# Patient Record
Sex: Female | Born: 1970 | Race: White | Hispanic: No | Smoking: Current some day smoker
Health system: Southern US, Community
[De-identification: ages and names within clinical notes are randomized; demographics above are authoritative.]

## PROBLEM LIST (undated history)

## (undated) DIAGNOSIS — I4892 Unspecified atrial flutter: Secondary | ICD-10-CM

## (undated) DIAGNOSIS — A419 Sepsis, unspecified organism: Secondary | ICD-10-CM

## (undated) DIAGNOSIS — I330 Acute and subacute infective endocarditis: Secondary | ICD-10-CM

## (undated) DIAGNOSIS — I76 Septic arterial embolism: Secondary | ICD-10-CM

## (undated) DIAGNOSIS — I669 Occlusion and stenosis of unspecified cerebral artery: Secondary | ICD-10-CM

## (undated) DIAGNOSIS — G8929 Other chronic pain: Secondary | ICD-10-CM

## (undated) DIAGNOSIS — Z9119 Patient's noncompliance with other medical treatment and regimen: Secondary | ICD-10-CM

## (undated) DIAGNOSIS — Z953 Presence of xenogenic heart valve: Secondary | ICD-10-CM

## (undated) DIAGNOSIS — F419 Anxiety disorder, unspecified: Secondary | ICD-10-CM

## (undated) DIAGNOSIS — E039 Hypothyroidism, unspecified: Secondary | ICD-10-CM

## (undated) DIAGNOSIS — Z950 Presence of cardiac pacemaker: Secondary | ICD-10-CM

## (undated) DIAGNOSIS — I442 Atrioventricular block, complete: Secondary | ICD-10-CM

## (undated) DIAGNOSIS — D649 Anemia, unspecified: Secondary | ICD-10-CM

## (undated) DIAGNOSIS — I639 Cerebral infarction, unspecified: Secondary | ICD-10-CM

## (undated) DIAGNOSIS — I38 Endocarditis, valve unspecified: Secondary | ICD-10-CM

## (undated) DIAGNOSIS — F1910 Other psychoactive substance abuse, uncomplicated: Secondary | ICD-10-CM

## (undated) DIAGNOSIS — I509 Heart failure, unspecified: Secondary | ICD-10-CM

## (undated) DIAGNOSIS — Z91199 Patient's noncompliance with other medical treatment and regimen due to unspecified reason: Secondary | ICD-10-CM

## (undated) DIAGNOSIS — R0602 Shortness of breath: Secondary | ICD-10-CM

## (undated) DIAGNOSIS — M1990 Unspecified osteoarthritis, unspecified site: Secondary | ICD-10-CM

## (undated) DIAGNOSIS — F1110 Opioid abuse, uncomplicated: Secondary | ICD-10-CM

## (undated) DIAGNOSIS — B1920 Unspecified viral hepatitis C without hepatic coma: Secondary | ICD-10-CM

## (undated) DIAGNOSIS — I2510 Atherosclerotic heart disease of native coronary artery without angina pectoris: Secondary | ICD-10-CM

## (undated) DIAGNOSIS — T826XXA Infection and inflammatory reaction due to cardiac valve prosthesis, initial encounter: Secondary | ICD-10-CM

## (undated) DIAGNOSIS — F99 Mental disorder, not otherwise specified: Secondary | ICD-10-CM

## (undated) HISTORY — DX: Unspecified viral hepatitis C without hepatic coma: B19.20

## (undated) HISTORY — DX: Heart failure, unspecified: I50.9

## (undated) HISTORY — DX: Acute and subacute infective endocarditis: I33.0

## (undated) HISTORY — PX: CHOLECYSTECTOMY: SHX55

## (undated) HISTORY — PX: TONSILLECTOMY AND ADENOIDECTOMY: SUR1326

## (undated) HISTORY — DX: Anemia, unspecified: D64.9

## (undated) HISTORY — DX: Hypothyroidism, unspecified: E03.9

## (undated) HISTORY — DX: Unspecified osteoarthritis, unspecified site: M19.90

## (undated) HISTORY — PX: CATARACT EXTRACTION: SUR2

---

## 1997-08-20 ENCOUNTER — Emergency Department (HOSPITAL_COMMUNITY): Admission: EM | Admit: 1997-08-20 | Discharge: 1997-08-20 | Payer: Self-pay | Admitting: Emergency Medicine

## 1998-02-08 ENCOUNTER — Emergency Department (HOSPITAL_COMMUNITY): Admission: EM | Admit: 1998-02-08 | Discharge: 1998-02-08 | Payer: Self-pay | Admitting: Emergency Medicine

## 2000-03-25 ENCOUNTER — Encounter: Payer: Self-pay | Admitting: Emergency Medicine

## 2000-03-25 ENCOUNTER — Inpatient Hospital Stay (HOSPITAL_COMMUNITY): Admission: EM | Admit: 2000-03-25 | Discharge: 2000-03-30 | Payer: Self-pay | Admitting: Emergency Medicine

## 2000-07-03 ENCOUNTER — Emergency Department (HOSPITAL_COMMUNITY): Admission: EM | Admit: 2000-07-03 | Discharge: 2000-07-03 | Payer: Self-pay | Admitting: Emergency Medicine

## 2000-10-07 ENCOUNTER — Emergency Department (HOSPITAL_COMMUNITY): Admission: EM | Admit: 2000-10-07 | Discharge: 2000-10-07 | Payer: Self-pay

## 2001-01-31 ENCOUNTER — Emergency Department (HOSPITAL_COMMUNITY): Admission: EM | Admit: 2001-01-31 | Discharge: 2001-01-31 | Payer: Self-pay | Admitting: Emergency Medicine

## 2001-01-31 ENCOUNTER — Encounter: Payer: Self-pay | Admitting: Emergency Medicine

## 2002-08-10 ENCOUNTER — Emergency Department (HOSPITAL_COMMUNITY): Admission: EM | Admit: 2002-08-10 | Discharge: 2002-08-10 | Payer: Self-pay | Admitting: Emergency Medicine

## 2002-09-15 ENCOUNTER — Emergency Department (HOSPITAL_COMMUNITY): Admission: EM | Admit: 2002-09-15 | Discharge: 2002-09-15 | Payer: Self-pay | Admitting: Emergency Medicine

## 2002-09-23 ENCOUNTER — Ambulatory Visit (HOSPITAL_COMMUNITY): Admission: RE | Admit: 2002-09-23 | Discharge: 2002-09-23 | Payer: Self-pay | Admitting: *Deleted

## 2002-09-23 ENCOUNTER — Encounter: Payer: Self-pay | Admitting: *Deleted

## 2002-09-27 ENCOUNTER — Emergency Department (HOSPITAL_COMMUNITY): Admission: EM | Admit: 2002-09-27 | Discharge: 2002-09-27 | Payer: Self-pay

## 2002-10-02 ENCOUNTER — Emergency Department (HOSPITAL_COMMUNITY): Admission: EM | Admit: 2002-10-02 | Discharge: 2002-10-02 | Payer: Self-pay | Admitting: Emergency Medicine

## 2002-10-13 ENCOUNTER — Emergency Department (HOSPITAL_COMMUNITY): Admission: EM | Admit: 2002-10-13 | Discharge: 2002-10-13 | Payer: Self-pay | Admitting: Emergency Medicine

## 2002-10-29 ENCOUNTER — Emergency Department (HOSPITAL_COMMUNITY): Admission: EM | Admit: 2002-10-29 | Discharge: 2002-10-30 | Payer: Self-pay | Admitting: Emergency Medicine

## 2004-07-18 ENCOUNTER — Emergency Department (HOSPITAL_COMMUNITY): Admission: EM | Admit: 2004-07-18 | Discharge: 2004-07-18 | Payer: Self-pay | Admitting: Emergency Medicine

## 2004-08-07 ENCOUNTER — Emergency Department (HOSPITAL_COMMUNITY): Admission: EM | Admit: 2004-08-07 | Discharge: 2004-08-07 | Payer: Self-pay | Admitting: Emergency Medicine

## 2004-08-09 ENCOUNTER — Emergency Department (HOSPITAL_COMMUNITY): Admission: EM | Admit: 2004-08-09 | Discharge: 2004-08-09 | Payer: Self-pay | Admitting: Emergency Medicine

## 2004-09-03 ENCOUNTER — Emergency Department (HOSPITAL_COMMUNITY): Admission: EM | Admit: 2004-09-03 | Discharge: 2004-09-03 | Payer: Self-pay | Admitting: Emergency Medicine

## 2005-01-22 ENCOUNTER — Emergency Department (HOSPITAL_COMMUNITY): Admission: EM | Admit: 2005-01-22 | Discharge: 2005-01-22 | Payer: Self-pay | Admitting: Emergency Medicine

## 2006-07-23 ENCOUNTER — Emergency Department (HOSPITAL_COMMUNITY): Admission: EM | Admit: 2006-07-23 | Discharge: 2006-07-23 | Payer: Self-pay | Admitting: Emergency Medicine

## 2006-11-29 ENCOUNTER — Other Ambulatory Visit: Payer: Self-pay

## 2006-11-29 ENCOUNTER — Inpatient Hospital Stay: Payer: Self-pay | Admitting: Vascular Surgery

## 2006-11-30 ENCOUNTER — Other Ambulatory Visit: Payer: Self-pay

## 2007-01-30 ENCOUNTER — Emergency Department (HOSPITAL_COMMUNITY): Admission: EM | Admit: 2007-01-30 | Discharge: 2007-01-30 | Payer: Self-pay | Admitting: Emergency Medicine

## 2007-02-01 ENCOUNTER — Emergency Department (HOSPITAL_COMMUNITY): Admission: EM | Admit: 2007-02-01 | Discharge: 2007-02-01 | Payer: Self-pay | Admitting: Family Medicine

## 2007-04-11 ENCOUNTER — Emergency Department (HOSPITAL_COMMUNITY): Admission: EM | Admit: 2007-04-11 | Discharge: 2007-04-11 | Payer: Self-pay | Admitting: Emergency Medicine

## 2007-06-28 ENCOUNTER — Emergency Department (HOSPITAL_COMMUNITY): Admission: EM | Admit: 2007-06-28 | Discharge: 2007-06-28 | Payer: Self-pay | Admitting: Emergency Medicine

## 2008-01-24 ENCOUNTER — Emergency Department (HOSPITAL_COMMUNITY): Admission: EM | Admit: 2008-01-24 | Discharge: 2008-01-24 | Payer: Self-pay | Admitting: Emergency Medicine

## 2008-07-18 ENCOUNTER — Emergency Department (HOSPITAL_COMMUNITY): Admission: EM | Admit: 2008-07-18 | Discharge: 2008-07-18 | Payer: Self-pay | Admitting: Emergency Medicine

## 2008-09-29 ENCOUNTER — Emergency Department (HOSPITAL_COMMUNITY): Admission: EM | Admit: 2008-09-29 | Discharge: 2008-09-29 | Payer: Self-pay | Admitting: Emergency Medicine

## 2008-11-23 ENCOUNTER — Emergency Department: Payer: Self-pay | Admitting: Emergency Medicine

## 2008-12-06 ENCOUNTER — Emergency Department: Payer: Self-pay | Admitting: Internal Medicine

## 2009-02-16 ENCOUNTER — Emergency Department: Payer: Self-pay | Admitting: Emergency Medicine

## 2009-05-29 ENCOUNTER — Emergency Department: Payer: Self-pay | Admitting: Unknown Physician Specialty

## 2009-06-13 ENCOUNTER — Ambulatory Visit: Payer: Self-pay | Admitting: Internal Medicine

## 2009-11-25 ENCOUNTER — Emergency Department (HOSPITAL_COMMUNITY): Admission: EM | Admit: 2009-11-25 | Discharge: 2009-11-26 | Payer: Self-pay | Admitting: Emergency Medicine

## 2009-12-29 ENCOUNTER — Emergency Department (HOSPITAL_COMMUNITY)
Admission: EM | Admit: 2009-12-29 | Discharge: 2009-12-29 | Payer: Self-pay | Source: Home / Self Care | Admitting: Emergency Medicine

## 2010-03-26 LAB — POCT I-STAT, CHEM 8
BUN: 8 mg/dL (ref 6–23)
Calcium, Ion: 1.08 mmol/L — ABNORMAL LOW (ref 1.12–1.32)
Chloride: 100 mEq/L (ref 96–112)
Creatinine, Ser: 0.8 mg/dL (ref 0.4–1.2)
Glucose, Bld: 128 mg/dL — ABNORMAL HIGH (ref 70–99)
HCT: 37 % (ref 36.0–46.0)
Potassium: 3 mEq/L — ABNORMAL LOW (ref 3.5–5.1)

## 2010-03-26 LAB — URINALYSIS, ROUTINE W REFLEX MICROSCOPIC
Bilirubin Urine: NEGATIVE
Glucose, UA: NEGATIVE mg/dL
Ketones, ur: NEGATIVE mg/dL
Protein, ur: 30 mg/dL — AB
pH: 6 (ref 5.0–8.0)

## 2010-03-26 LAB — DIFFERENTIAL
Lymphocytes Relative: 18 % (ref 12–46)
Lymphs Abs: 1 10*3/uL (ref 0.7–4.0)
Monocytes Absolute: 0.6 10*3/uL (ref 0.1–1.0)
Monocytes Relative: 11 % (ref 3–12)
Neutro Abs: 3.7 10*3/uL (ref 1.7–7.7)
Neutrophils Relative %: 69 % (ref 43–77)

## 2010-03-26 LAB — URINE MICROSCOPIC-ADD ON

## 2010-03-26 LAB — CBC
HCT: 35.3 % — ABNORMAL LOW (ref 36.0–46.0)
Hemoglobin: 12.2 g/dL (ref 12.0–15.0)
MCHC: 34.6 g/dL (ref 30.0–36.0)
RBC: 3.95 MIL/uL (ref 3.87–5.11)

## 2010-04-01 ENCOUNTER — Emergency Department (HOSPITAL_COMMUNITY)
Admission: EM | Admit: 2010-04-01 | Discharge: 2010-04-01 | Disposition: A | Discharge status: Home / Self Care | Payer: Self-pay | Attending: Emergency Medicine | Admitting: Emergency Medicine

## 2010-04-01 DIAGNOSIS — Z8619 Personal history of other infectious and parasitic diseases: Secondary | ICD-10-CM | POA: Insufficient documentation

## 2010-04-01 DIAGNOSIS — M25519 Pain in unspecified shoulder: Secondary | ICD-10-CM | POA: Insufficient documentation

## 2010-05-14 ENCOUNTER — Ambulatory Visit: Payer: Self-pay | Admitting: Internal Medicine

## 2010-05-14 DIAGNOSIS — I639 Cerebral infarction, unspecified: Secondary | ICD-10-CM

## 2010-05-14 DIAGNOSIS — I669 Occlusion and stenosis of unspecified cerebral artery: Secondary | ICD-10-CM

## 2010-05-14 HISTORY — DX: Occlusion and stenosis of unspecified cerebral artery: I66.9

## 2010-05-14 HISTORY — DX: Cerebral infarction, unspecified: I63.9

## 2010-05-23 ENCOUNTER — Emergency Department (HOSPITAL_COMMUNITY)
Admission: EM | Admit: 2010-05-23 | Discharge: 2010-05-23 | Disposition: A | Discharge status: Home / Self Care | Payer: Medicaid Other | Attending: Emergency Medicine | Admitting: Emergency Medicine

## 2010-05-23 DIAGNOSIS — M79609 Pain in unspecified limb: Secondary | ICD-10-CM

## 2010-05-23 DIAGNOSIS — M7989 Other specified soft tissue disorders: Secondary | ICD-10-CM | POA: Insufficient documentation

## 2010-05-23 DIAGNOSIS — X58XXXA Exposure to other specified factors, initial encounter: Secondary | ICD-10-CM | POA: Insufficient documentation

## 2010-05-23 DIAGNOSIS — T148XXA Other injury of unspecified body region, initial encounter: Secondary | ICD-10-CM | POA: Insufficient documentation

## 2010-05-23 DIAGNOSIS — B1920 Unspecified viral hepatitis C without hepatic coma: Secondary | ICD-10-CM | POA: Insufficient documentation

## 2010-05-23 DIAGNOSIS — M25539 Pain in unspecified wrist: Secondary | ICD-10-CM | POA: Insufficient documentation

## 2010-05-31 NOTE — Discharge Summary (Signed)
[place]. [facility]  Patient:    [name], [name]                         MRN: [id number] Adm. Date:  [id number] Disc. Date: [id number] Attending:  Dominica Severin Dictator:   Alexzandrew L. Perkins, P.A.-C.                           Discharge Summary  ADMITTING DIAGNOSES: 1. Right hand cellulitis and infection. 2. Past history of bronchitis, approximately three years ago. 3. Tobacco history.  DISCHARGE DIAGNOSES: 1. Right wrist abscess, dorsal ulnar aspect.  Status post incision and    drainage dorsal ulnar aspect right wrist and hand. 2. Past history of bronchitis, approximately three years ago. 3. Tobacco history.  PROCEDURE:  Patient was taken to the OR on March 25, 2000, underwent an I&D of the dorsal ulnar aspect of the right wrist and the hand with a noted abscess. Surgeon Dr. Amanda Pea.  Surgery done was under general anesthesia.  One large iodoform gauze packing drain was placed.  Cultures - aerobic/anaerobic - were taken at the time of procedure.  CONSULTS:  None.  BRIEF HISTORY:  The patient is a 40 year old female who shut her right hand in a car door approximately three days prior to admission, on Sunday.  She had immediate onset of pain felt on the injury.  However, over the past several days she had a small area on the dorsal aspect of her hand which started to swell and became painful, eventually formed a small boil which opened up, started draining a purulent pus-like material.  She was seen in the ER, felt to have cellulitis with an abscess on the right hand, Dr. Amanda Pea on call.  Pt was seen and evaluated and admitted for IV antibiotics and also surgery.  LABORATORY DATA:  CBC on admission showed a hematocrit 41.5, white cell count 8.4, hemoglobin 14.2.  Differential all within normal limits.  Cultures x 2 - tissue culture taken on March 25, 2000 showed no organisms with moderate wbcs on the gram stain.  Culture showed few microaerophilic  streptococci. Anaerobic culture taken on March 25, 2000 showed no organisms and moderate wbcs on the grain stain with no anaerobes isolated.  HOSPITAL COURSE:  Patient was admitted to [facility] on March 25, 2000, started on IV antibiotics.  She was started on IV Unasyn 3 g in the emergency department, planned for admission.  She was taken to the OR later that day and underwent the above-stated procedure.  She was transferred to the orthopedic floor, continued postoperative care.  Hand was elevated.  IV antibiotics were continued.  The patient was started on IV morphine supplementation with p.o. Percocet for pain, encouraged finger movement for edema control.  Dressing change was on the following day per Dr. Amanda Pea with the iodoform wick being removed.  She had noted erythema and edema around the incision.  She continued with IV antibiotics postoperatively.  Pain continued to improve postoperatively.  She was eventually weaned over to p.o. analgesics.  The wound was observed throughout the hospital course to a point where on March 30, 2000 she was doing quite well, moving fingers very well. She had been keeping the right hand and wrist elevated in the mission sling. IV antibiotic course had been completed.  It was decided she would be switched over to p.o. antibiotics and discharged home.  DISCHARGE  PLAN:  Patient is discharged home on March 30, 2000.  DISCHARGE DIAGNOSES:  Please see above.  DISCHARGE MEDICATIONS:] 1. Percocet #30 p.r.n. pain. 2. Amoxicillin 875 mg #20 b.i.d. for 10 more days.  DIET:  As tolerated.  ACTIVITY:  Keep the dressing clean and dry.  Daily dressing changes at home.  FOLLOW-UP:  In five to seven days with Dr. Amanda Pea.  DISPOSITION:  Home.  CONDITION UPON DISCHARGE:  Improved. DD:  04/13/00 TD:  04/13/00 Job: [id number]

## 2010-05-31 NOTE — Op Note (Signed)
[place]. [facility]  Patient:    [name], [name]                         MRN: [id number] Proc. Date: 03/25/00 Adm. Date:  [id number] Attending:  Dominica Severin                           Operative Report  PREOPERATIVE DIAGNOSIS:  Right wrist abscess, dorsal ulnar aspect.  POSTOPERATIVE DIAGNOSIS:  Right wrist abscess, dorsal ulnar aspect.  PROCEDURE:  Incision and drainage dorsal ulnar aspect of the right wrist and hand with noted abscess.  SURGEON:  Aron Baba, M.D.  ASSISTANT:  None.  ANESTHESIA:  General.  COMPLICATIONS:  None.  DRAINS:  One large Iodoform gauze packing drain.  CULTURES:  Aerobic and anaerobic taken.  SPECIMENS:  None.  TOURNIQUET TIME:  Zero.  INDICATIONS FOR THE PROCEDURE:  This patient is a pleasant white female, who presented with the above-mentioned diagnosis.  I have counselled her in regards to risks and benefits of surgery at length and she desires to proceed with I&D as described below.  The patient has negative x-rays.  Her white count is normal.  She has positive signs of an abscess about the dorsal/ulnar aspect of her wrist and hand with ______ cellulitis.  I feel that she should undergo I&D and exploration of the area in question.  This injury was originally sustained days ago when she hit it against the car door.  OPERATIVE FINDINGS:  The patient had a positive abscess about the dorsal ulnar aspect of the wrist.  This was I&D and cultured.  DESCRIPTION OF PROCEDURE:  The patient was seen by myself and anesthesia and taken to the operative suite, underwent smooth induction of anesthesia and was then prepped and draped in usual sterile fashion about the right upper extremity after appropriate padding in the supine position was accomplished. Once this was done and the patient had a sterile field secured and an I&D was accomplished.  This was done by making a small opening overlying the area that appeared  to be pointing towards the abscess.  This was done with a knife and blunt tip type surgical instrument.  The patient had immediate egress of purulent fluid.  This was cultured for aerobic and anaerobic culture. Following this, a large amount of fat necrosis and purulent tissue was expressed.  The patient underwent systematic examination and I&D of the skin and subcutaneous tissue and peritendinous tissue.  There was no obvious foreign body.  The patient had a large area that tracked underneath the skin where the purulence was located.  This was all I&Dd and necrotic tissue removed and copious amounts of saline placed in the wound.  Following this, the patient had the wound packed with Iodoform gauze without difficulty.  She was then placed in a sterile dressing without difficulty.  She had normal refill at the conclusion of the case.  Tourniquet was not inflated.  She tolerated the procedure well without difficulty.  PLAN:  She will be admitted for IV antibiotics, elevation and close observation.  I have discussed findings with family, etc.  All questions have been encouraged and answered. DD:  03/25/00 TD:  03/25/00 Job: [id number]

## 2010-05-31 NOTE — H&P (Signed)
[place]. [facility]  Patient:    [name], [name]                         MRN: [id number] Adm. Date:  [id number] Attending:  Cathren Laine                         History and Physical  CHIEF COMPLAINT:  Right hand pain.  HISTORY OF PRESENT ILLNESS:  The patient is a 40 year old female who shut her right hand in a car door approximately three days ago, on Sunday. She had immediate onset of pain following the injury, however, over the past several days the small area on the dorsal aspect of her hand has started to swell and become painful and has swelled up into a small boil which opened up and started draining a pus-like material. She stated that most of today it has been draining kind of a yellowish fluid. Her hand is quite tender and warm to touch. She came over to [facility] Emergency Department for evaluation. The patient was seen and evaluated by the emergency staff and found to have cellulitis and infection into the right hand. Dr. Onalee Hua was on call for [facility] and hand call. The patient was seen and evaluated and due to her cellulitis and infection, it was felt that she would best be served by being admitted to the hospital for IV antibiotics and also for irrigation and debridement of the right hand. The risks and benefits of the procedure were discussed as per Dr. Amanda Pea and the patient subsequently admitted.  ALLERGIES:  CODEINE causes a rash.  CURRENT MEDICATIONS:  None.  PAST MEDICAL HISTORY:  Past history of bronchitis approximately three years ago.  PAST SURGICAL HISTORY: 1. Appendectomy. 2. Tonsillectomy and adenoidectomy. 3. Tubal ligation.  SOCIAL HISTORY:  The patient is separated. She is unemployed at this time. She has three children. She is a 1-pack per day smoker and denies use of alcohol.  FAMILY HISTORY:  Mother deceased at age 22 secondary to a shooting and father deceased in his 69s from a  motor vehicle accident.  REVIEW OF SYSTEMS:  GENERAL:  No chills or night sweats, however, she has had some intermittent fevers over the past 48 hours. NEUROLOGICAL:  No syncope, seizures or paralysis. RESPIRATORY:  No shortness of breath, productive cough or hemoptysis.  CARDIOVASCULAR:  No chest pain, angina or orthopnea.  GI:  No diarrhea or constipation. No vomiting. She has felt nauseated intermittently today. No blood or mucous in the stool. GU:  No dysuria, hematuria or discharge.  MUSCULOSKELETAL:  Pertinent to that of the right hand found in the history of present illness.  PHYSICAL EXAMINATION:  VITAL SIGNS:  Temperature 97.7, pulse 87, respirations 18 and blood pressure 91/73.  GENERAL:  The patient is a 40 year old white female, well-developed, well-nourished, slightly overweight in in no acute distress. She is alert, oriented and cooperative.  HEENT:  Normocephalic and atraumatic. Pupils equal, round and reactive to light and accommodation. Extraocular movements intact. Oropharynx is clear.  NECK:  Supple, no carotid bruit.  LUNGS:  Chest clear to auscultation and percussion, no rhonchi or rales.  CARDIOVASCULAR:  Regular rate and rhythm, no murmurs, S1 and S2 noted.  ABDOMEN:  Soft, round, nontender, bowel sounds are present.  BREASTS:  Not performed, not pertinent to present illness.  GENITORECTAL:  Not performed, not pertinent to present illness.  EXTREMITIES:  Significant to the right hand. The patient has a small 2-3 cm ulcerative-type draining wound on the dorsum of her right hand in the mid-region over top of the 4th and 5th metacarpal with surrounding cellulitis of the dorsum of the hand and extending proximally over the wrist. It is warm to touch and indurated. It is draining amount of pus-like and also a small amount of serous-bloody drainage. Plus 2 radial pulse intact. Sensation is intact throughout the right hand the digits with a little decreased  sensation over the 5th finger to light touch.  LABORATORY AND ACCESSORY DATA:  CBC shows a white blood cell count of 8.4, hemoglobin of 14.2, hematocrit 41.5. Chem panel:  Sodium 139, potassium 4.1, chloride 103, BUN 6.0, creatinine 0.5 with a glucose level of 79.  IMPRESSION: 1. Right hand cellulitis and infection. 2. Past history of bronchitis three years ago. 3. Tobacco history.  PLAN:  The patient is admitted to [facility] to undergo IV antibiotics and also surgery to include an I&D of the right hand. Surgery will be performed by Dr. Onalee Hua. DD:  03/25/00 TD:  03/25/00 Job: [id number]

## 2010-06-11 ENCOUNTER — Inpatient Hospital Stay: Payer: Self-pay | Admitting: Internal Medicine

## 2010-06-13 ENCOUNTER — Ambulatory Visit: Payer: Self-pay | Admitting: Internal Medicine

## 2010-06-14 ENCOUNTER — Ambulatory Visit: Payer: Self-pay | Admitting: Internal Medicine

## 2010-06-14 HISTORY — PX: VALVE REPLACEMENT: SUR13

## 2010-06-23 ENCOUNTER — Inpatient Hospital Stay: Payer: Self-pay | Admitting: Internal Medicine

## 2010-07-14 ENCOUNTER — Ambulatory Visit: Payer: Self-pay | Admitting: Internal Medicine

## 2010-07-22 ENCOUNTER — Inpatient Hospital Stay (HOSPITAL_COMMUNITY)
Admission: RE | Admit: 2010-07-22 | Discharge: 2010-08-02 | DRG: 945 | Disposition: A | Discharge status: Home Health | Payer: Medicaid Other | Source: Other Acute Inpatient Hospital | Attending: Physical Medicine & Rehabilitation | Admitting: Physical Medicine & Rehabilitation

## 2010-07-22 ENCOUNTER — Inpatient Hospital Stay (HOSPITAL_COMMUNITY): Payer: Medicaid Other

## 2010-07-22 DIAGNOSIS — R5381 Other malaise: Secondary | ICD-10-CM | POA: Diagnosis present

## 2010-07-22 DIAGNOSIS — D696 Thrombocytopenia, unspecified: Secondary | ICD-10-CM | POA: Diagnosis present

## 2010-07-22 DIAGNOSIS — G9341 Metabolic encephalopathy: Secondary | ICD-10-CM

## 2010-07-22 DIAGNOSIS — G589 Mononeuropathy, unspecified: Secondary | ICD-10-CM | POA: Diagnosis present

## 2010-07-22 DIAGNOSIS — G7281 Critical illness myopathy: Secondary | ICD-10-CM

## 2010-07-22 DIAGNOSIS — I38 Endocarditis, valve unspecified: Secondary | ICD-10-CM | POA: Diagnosis present

## 2010-07-22 DIAGNOSIS — I609 Nontraumatic subarachnoid hemorrhage, unspecified: Secondary | ICD-10-CM | POA: Diagnosis present

## 2010-07-22 DIAGNOSIS — Z7982 Long term (current) use of aspirin: Secondary | ICD-10-CM

## 2010-07-22 DIAGNOSIS — I10 Essential (primary) hypertension: Secondary | ICD-10-CM | POA: Diagnosis present

## 2010-07-22 DIAGNOSIS — K7460 Unspecified cirrhosis of liver: Secondary | ICD-10-CM | POA: Diagnosis present

## 2010-07-22 DIAGNOSIS — I509 Heart failure, unspecified: Secondary | ICD-10-CM | POA: Diagnosis present

## 2010-07-22 DIAGNOSIS — F1910 Other psychoactive substance abuse, uncomplicated: Secondary | ICD-10-CM | POA: Diagnosis present

## 2010-07-22 DIAGNOSIS — I6340 Cerebral infarction due to embolism of unspecified cerebral artery: Secondary | ICD-10-CM

## 2010-07-22 DIAGNOSIS — I079 Rheumatic tricuspid valve disease, unspecified: Secondary | ICD-10-CM | POA: Diagnosis present

## 2010-07-22 DIAGNOSIS — B1920 Unspecified viral hepatitis C without hepatic coma: Secondary | ICD-10-CM | POA: Diagnosis present

## 2010-07-22 DIAGNOSIS — R197 Diarrhea, unspecified: Secondary | ICD-10-CM | POA: Diagnosis present

## 2010-07-22 DIAGNOSIS — J9600 Acute respiratory failure, unspecified whether with hypoxia or hypercapnia: Secondary | ICD-10-CM | POA: Diagnosis present

## 2010-07-22 DIAGNOSIS — N179 Acute kidney failure, unspecified: Secondary | ICD-10-CM | POA: Diagnosis present

## 2010-07-22 DIAGNOSIS — I2699 Other pulmonary embolism without acute cor pulmonale: Secondary | ICD-10-CM | POA: Diagnosis present

## 2010-07-22 DIAGNOSIS — R1310 Dysphagia, unspecified: Secondary | ICD-10-CM | POA: Diagnosis present

## 2010-07-22 DIAGNOSIS — I4892 Unspecified atrial flutter: Secondary | ICD-10-CM | POA: Diagnosis present

## 2010-07-22 DIAGNOSIS — A4189 Other specified sepsis: Secondary | ICD-10-CM

## 2010-07-22 DIAGNOSIS — F17200 Nicotine dependence, unspecified, uncomplicated: Secondary | ICD-10-CM | POA: Diagnosis present

## 2010-07-22 DIAGNOSIS — IMO0002 Reserved for concepts with insufficient information to code with codable children: Secondary | ICD-10-CM | POA: Diagnosis present

## 2010-07-22 DIAGNOSIS — I059 Rheumatic mitral valve disease, unspecified: Secondary | ICD-10-CM | POA: Diagnosis present

## 2010-07-22 DIAGNOSIS — I214 Non-ST elevation (NSTEMI) myocardial infarction: Secondary | ICD-10-CM | POA: Diagnosis present

## 2010-07-22 DIAGNOSIS — Z5189 Encounter for other specified aftercare: Principal | ICD-10-CM

## 2010-07-22 DIAGNOSIS — A419 Sepsis, unspecified organism: Secondary | ICD-10-CM | POA: Diagnosis present

## 2010-07-22 LAB — URINALYSIS, ROUTINE W REFLEX MICROSCOPIC
Glucose, UA: NEGATIVE mg/dL
Ketones, ur: NEGATIVE mg/dL
Leukocytes, UA: NEGATIVE
Nitrite: NEGATIVE
Specific Gravity, Urine: 1.009 (ref 1.005–1.030)
pH: 6.5 (ref 5.0–8.0)

## 2010-07-22 LAB — GLUCOSE, CAPILLARY

## 2010-07-22 LAB — URINE MICROSCOPIC-ADD ON

## 2010-07-23 DIAGNOSIS — A4189 Other specified sepsis: Secondary | ICD-10-CM

## 2010-07-23 DIAGNOSIS — I6340 Cerebral infarction due to embolism of unspecified cerebral artery: Secondary | ICD-10-CM

## 2010-07-23 DIAGNOSIS — G7281 Critical illness myopathy: Secondary | ICD-10-CM

## 2010-07-23 DIAGNOSIS — G9341 Metabolic encephalopathy: Secondary | ICD-10-CM

## 2010-07-23 LAB — URINE CULTURE

## 2010-07-23 LAB — GLUCOSE, CAPILLARY
Glucose-Capillary: 80 mg/dL (ref 70–99)
Glucose-Capillary: 93 mg/dL (ref 70–99)

## 2010-07-23 LAB — COMPREHENSIVE METABOLIC PANEL
ALT: 15 U/L (ref 0–35)
AST: 17 U/L (ref 0–37)
CO2: 28 mEq/L (ref 19–32)
Chloride: 97 mEq/L (ref 96–112)
Creatinine, Ser: <0.47 mg/dL — ABNORMAL LOW (ref 0.50–1.10)
Glucose, Bld: 170 mg/dL — ABNORMAL HIGH (ref 70–99)
Sodium: 135 mEq/L (ref 135–145)
Total Bilirubin: 0.4 mg/dL (ref 0.3–1.2)

## 2010-07-24 LAB — GLUCOSE, CAPILLARY
Glucose-Capillary: 170 mg/dL — ABNORMAL HIGH (ref 70–99)
Glucose-Capillary: 104 mg/dL — ABNORMAL HIGH (ref 70–99)
Glucose-Capillary: 94 mg/dL (ref 70–99)

## 2010-07-25 LAB — GLUCOSE, CAPILLARY: Glucose-Capillary: 92 mg/dL (ref 70–99)

## 2010-07-26 DIAGNOSIS — I6340 Cerebral infarction due to embolism of unspecified cerebral artery: Secondary | ICD-10-CM

## 2010-07-26 DIAGNOSIS — G7281 Critical illness myopathy: Secondary | ICD-10-CM

## 2010-07-26 DIAGNOSIS — G9341 Metabolic encephalopathy: Secondary | ICD-10-CM

## 2010-07-26 DIAGNOSIS — A4189 Other specified sepsis: Secondary | ICD-10-CM

## 2010-07-26 LAB — GLUCOSE, CAPILLARY: Glucose-Capillary: 76 mg/dL (ref 70–99)

## 2010-07-29 DIAGNOSIS — G9341 Metabolic encephalopathy: Secondary | ICD-10-CM

## 2010-07-29 DIAGNOSIS — G960 Cerebrospinal fluid leak: Secondary | ICD-10-CM

## 2010-07-29 DIAGNOSIS — A4189 Other specified sepsis: Secondary | ICD-10-CM

## 2010-07-29 DIAGNOSIS — I6340 Cerebral infarction due to embolism of unspecified cerebral artery: Secondary | ICD-10-CM

## 2010-07-29 DIAGNOSIS — G9601 Cranial cerebrospinal fluid leak, spontaneous: Secondary | ICD-10-CM

## 2010-07-31 DIAGNOSIS — A4189 Other specified sepsis: Secondary | ICD-10-CM

## 2010-07-31 DIAGNOSIS — G7281 Critical illness myopathy: Secondary | ICD-10-CM

## 2010-07-31 DIAGNOSIS — I6340 Cerebral infarction due to embolism of unspecified cerebral artery: Secondary | ICD-10-CM

## 2010-07-31 DIAGNOSIS — G9341 Metabolic encephalopathy: Secondary | ICD-10-CM

## 2010-08-02 DIAGNOSIS — I6340 Cerebral infarction due to embolism of unspecified cerebral artery: Secondary | ICD-10-CM

## 2010-08-02 DIAGNOSIS — A4189 Other specified sepsis: Secondary | ICD-10-CM

## 2010-08-02 DIAGNOSIS — G7281 Critical illness myopathy: Secondary | ICD-10-CM

## 2010-08-02 DIAGNOSIS — G9341 Metabolic encephalopathy: Secondary | ICD-10-CM

## 2010-08-13 ENCOUNTER — Emergency Department (HOSPITAL_COMMUNITY)
Admission: EM | Admit: 2010-08-13 | Discharge: 2010-08-14 | Disposition: A | Discharge status: Home / Self Care | Payer: Medicaid Other | Attending: Emergency Medicine | Admitting: Emergency Medicine

## 2010-08-13 DIAGNOSIS — R339 Retention of urine, unspecified: Secondary | ICD-10-CM | POA: Insufficient documentation

## 2010-08-13 DIAGNOSIS — B1920 Unspecified viral hepatitis C without hepatic coma: Secondary | ICD-10-CM | POA: Insufficient documentation

## 2010-08-13 DIAGNOSIS — N342 Other urethritis: Secondary | ICD-10-CM | POA: Insufficient documentation

## 2010-08-13 DIAGNOSIS — R300 Dysuria: Secondary | ICD-10-CM | POA: Insufficient documentation

## 2010-08-13 LAB — DIFFERENTIAL
Eosinophils Absolute: 0.1 10*3/uL (ref 0.0–0.7)
Lymphocytes Relative: 42 % (ref 12–46)
Lymphs Abs: 2 10*3/uL (ref 0.7–4.0)
Neutro Abs: 2.3 10*3/uL (ref 1.7–7.7)
Neutrophils Relative %: 48 % (ref 43–77)

## 2010-08-13 LAB — AMMONIA: Ammonia: 11 umol/L (ref 11–60)

## 2010-08-13 LAB — CBC
HCT: 38.2 % (ref 36.0–46.0)
Hemoglobin: 12.3 g/dL (ref 12.0–15.0)
MCV: 98.7 fL (ref 78.0–100.0)
Platelets: 275 10*3/uL (ref 150–400)
RBC: 3.87 MIL/uL (ref 3.87–5.11)
WBC: 4.8 10*3/uL (ref 4.0–10.5)

## 2010-08-13 LAB — URINALYSIS, ROUTINE W REFLEX MICROSCOPIC
Glucose, UA: NEGATIVE mg/dL
Hgb urine dipstick: NEGATIVE
Leukocytes, UA: NEGATIVE
Specific Gravity, Urine: 1.014 (ref 1.005–1.030)
pH: 6.5 (ref 5.0–8.0)

## 2010-08-13 LAB — COMPREHENSIVE METABOLIC PANEL
AST: 51 U/L — ABNORMAL HIGH (ref 0–37)
CO2: 26 mEq/L (ref 19–32)
Chloride: 104 mEq/L (ref 96–112)
Creatinine, Ser: 0.54 mg/dL (ref 0.50–1.10)
GFR calc non Af Amer: >60 mL/min (ref >60)
Glucose, Bld: 100 mg/dL — ABNORMAL HIGH (ref 70–99)
Total Bilirubin: 0.3 mg/dL (ref 0.3–1.2)

## 2010-08-21 NOTE — H&P (Signed)
[name], [name]                  ACCOUNT NO.:  [id number]  MEDICAL RECORD NO.:  [id number]  LOCATION:  4028                         FACILITY:  [facility]  PHYSICIAN:  Ranelle Oyster, M.D.DATE OF BIRTH:  [birth year]  DATE OF ADMISSION:  07/22/2010 DATE OF DISCHARGE:                             HISTORY & PHYSICAL   CHIEF COMPLAINT:  Weakness after multiple medical problems.  HISTORY OF PRESENT ILLNESS:  This is a 40 year old white female with history of polysubstance abuse, hepatitis C, who was admitted to [facility] on Jun 11, 2010, with sepsis, thrombocytopenia, and hypertension.  UDS was positive for cocaine, benzos, and methadone.  Blood cultures positive for methicillin- sensitive staph aureus and CT was positive for moderate-sized vegetations in the mitral and tricuspid valves with moderate mitral valve regurgitation and tricuspid valve regurgitation.  Course was complicated by a non-ST-elevation MI.  She developed left frontal parietal subarachnoid hemorrhage secondary to septic emboli.  She is transferred to [facility] for treatment and require intubation for acute respiratory failure and then transferred back to [facility].  She developed bilateral pulmonary infarcts quite after reintubation and Cardiology felt she required CT surgery and the patient was transferred to [facility] on July 02, 2010.  She had issues there with acute renal failure requiring hemodialysis for a short- time period.  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  CTA of the head was done showing no hemorrhage or infarct.  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  Postoperatively, she had hypotension requiring pressors and she had atrial flutter as well through the amiodarone.  She continues on IV nafcillin for 6 weeks for treatment of endocarditis.  She continues  with decreased mentation.  PEG was placed on July 12, 2010.  Ophthalmology evaluation was done for cloudy left asymmetric pupil which was felt that be due to a prior episode of the right iritis.  Therapies been ongoing.  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  FEES was done on July 16, 2010, and the patient was placed on D2 soup diet with nectar liquids.  She tells [place] that she had another feed today which advanced her diet further, although we have no record of this.  Rehab Team has been following along with this patient and felt that she could benefit from an inpatient stay.  REVIEW OF SYSTEMS:  Notable for occasional incontinence, low back pain, weakness, diarrhea.  Full 12-point review is in the written H and P. Other pertinent positives above.  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  FAMILY HISTORY:  Positive for cancer, questionable CAD.  SOCIAL HISTORY:  Single, living with different friends prior to arrival. She plans to discharge to family to her daughter or sister's house potentially.  Daughter is in one-level house, few steps to enter.  The patient smoked a pack of cigarettes per day.  Does not drink and use cocaine and marijuana.  ALLERGIES:  NO KNOWN DRUG ALLERGIES.  MEDICATIONS AT HOME:  None.  PHYSICAL EXAMINATION:  VITAL SIGNS:  Blood pressure is 124/78, pulse is 84, respiratory rate is 16. GENERAL:  The patient is sitting in bed rather flat.  She is cooperative. HEENT:  Pupils are reactive to light.  She did have some dilation of the left pupil.  Ear, nose and throat exam notable for dry mucosa.  Multiple dental caries and broken teeth. NECK:  Supple without JVD or lymphadenopathy. CHEST:  Clear except for decreased sounds in the right space.  She  has positive murmur and click on heart auscultation. EXTREMITIES:  No clubbing, cyanosis or edema. ABDOMEN:  Soft, nontender.  The patient has some dried material around the PEG site, but otherwise, it was clean. Chest incision was clean and intact and healing quite nicely.  She had some petechial areas along the bilateral plantar foot surfaces. NEUROLOGIC:  Cranial nerves II-XII are grossly intact.  Reflexes 1+. Sensation was grossly intact to light touch and pinprick in all 4 extremities.  Judgment was fair for basic information.  She is able to follow simple commands and answer simple questions.  She is oriented to place with extra time and the reason she was here.  Memory was fair for short-term information.  Strength was 3+ to 4/5 upper extremities proximal distal.  Lower extremity strength was 2+/5 proximally at the hips with hip flexion.  She is 3/5 at the knees and 3+/5 ankle dorsiflexion, plantar flexion.  POST ADMISSION PHYSICIAN EVALUATION.: 1. 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     The patient required tricuspid and mitral valve replacement as     well. 2. The patient is admitted to receive collaborative interdisciplinary     care between the physiatrist, rehab nursing staff, and therapy     team. 3. The patient's level of medical complexity and substantial therapy     needs in context of that medical necessity cannot be provided a     lesser intensity of care. 4. The patient has experienced substantial functional loss from her     baseline.  Premorbidly, she was independent.  Currently, she is     supervision for grooming, sits at the edge of bed with contact     guard assist, min assist transfers, min assist ambulating 3000 feet      with rolling walker with mild ataxia.  Judging by the patient's     diagnosis, physical exam and functional history, she has a     potential for functional progress which will result in measurable     gains while inpatient rehab.  These gains will be of substantial     practical use upon discharge to home in facilitating mobility and     self-care. 5. The physiatrist provide 24-hour management of medical needs as well     as oversight of therapy plans/treatment to provide guidance as     appropriate regarding interaction of two. 6. A 24-hour rehab nursing team will assist in management of the     patient's skin care needs as well as nutrition, pain management,     integration of therapy concept techniques, education, etc. 7. PT will assess and treat for lower extremity strength, range of  motion, functional mobility, balance, adaptive equipment techniques     and family inpatient education with goals modified independent     supervision. 8. OT will assess and treat for upper extremity use ADLs, adaptive     techniques, equipment, functional mobility, safety, upper extremity     strength, cognitive perceptual training and goals modified     independent to supervision. 9. Speech Language pathology will follow for cognitive needs as well     as swallowing and communication with goals modified independent to     supervision. 10.Case management and social worker will assess and treat for     psychosocial issues and discharge planning. 11.Team conference will be held weekly to assess progress towards     goals and to determine barriers at discharge. 12.The patient has demonstrated sufficient medical stability and     exercise capacity to tolerate at least 3 hours therapy per day at     least 5 days per week. 13.Estimated length of stay is 1-2 weeks.  Prognosis is good.  MEDICAL PROBLEM LIST AND PLAN: 1. DVT prophylaxis and ambulation with SCDs.  The patient should be     able to move  amply for any significant sequelae here. 2. Pain management with Tylenol as well as lidocaine patches for low     back pain.  Also, will have therapy work on modalities and     techniques as well as good posture etc. for low back symptoms. 3. Staph bacteremia:  The patient completed antibiotics today.  We     will follow clinically.  Check CBCs, vital signs, etc. 4. Dysphagia:  Follow-up with primary team at [facility] regarding potential     FEES study that was done today.  Advance diet further depending     upon results.  The patient will continue with D2 nectar liquids for     the time being. 5. CHF:  She is on Lasix.  We will check weights daily as well as I's     and O's.  She seems somewhat compensated at this point. 6. Diarrhea:  Check C. diff as well as hold softeners laxative     laxatives at this point. 7. A flutter:  The patient is in sinus rhythm today.  We will continue     amiodarone.  Follow heart rate.  Another vital signs to assess for     tolerance and increased activity with therapy. 8. Acute renal failure:  This appears to have resolved.  We will check     BUN and creatinine upon admission to rehab. 9. Respiratory:  albuterol and Atrovent inhalers q.i.d.[name]  Provide     oxygen as needed.  Check sats at rest and with activity.  Adjust     going forward.     Ranelle Oyster, M.D.     ZTS/MEDQ  D:  07/22/2010  T:  07/23/2010  Job:  [id number]  Electronically Signed by Faith Rogue M.D. on 08/21/2010 09:51:50 AM

## 2010-08-21 NOTE — Discharge Summary (Signed)
[name], [name]                  ACCOUNT NO.:  [id number]  MEDICAL RECORD NO.:  [id number]  LOCATION:  4028                         FACILITY:  [facility]  PHYSICIAN:  Ranelle Oyster, M.D.DATE OF BIRTH:  10-19-1970  DATE OF ADMISSION:  07/22/2010 DATE OF DISCHARGE:  08/02/2010                              DISCHARGE SUMMARY   DISCHARGE DIAGNOSES: 1. Critical illness, myopathy. 2. 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 3. Congestive heart failure, compensated. 4. Chronic pain due to degenerative disk disease. 5. Hypotension.  HISTORY OF PRESENT ILLNESS:  [name] is a 40 year old female with history of polysubstance abuse, hepatitis C, admitted to [facility] on Jun 11, 2010, with sepsis, thrombocytopenia, and hypotension.  UDS was positive for cocaine, benzos, and methadone. Blood cultures were positive for MSSA and cardiac echo showed moderate- sized medications on mitral and tricuspid valve with moderate MVR and TVR.  Hospital course was complicated by an NSTEMI, distorted left pupil and left frontoparietal subarachnoid hemorrhage due to septic emboli. She was transferred to [facility] for treatment and required intubation for acute respiratory failure.  She was transferred back to [facility] where she developed pulmonary infarcts.  She did require reintubation and Card felt she would require a CT surgery; therefore, the patient transferred to [facility] on July 02, 2010.  The patient also has had issues with acute renal failure requiring hemodialysis for short time.  At time of admission to [facility], she was noted to have incomplete tetraplegia.  CT of head done showed no hemorrhage or infarct.  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  Postop with hypotension requiring pressors and Aflutter requiring amiodarone.  She has been treated with IV  antibiotics x6 weeks for endocarditis.  She continued to have issues with decreased mentation and PEG was placed on July 12, 2010, by Interventional Radiology.  Ophthalmology evaluation done for cloudy left asymmetric pupil and they felt this was likely due to prior episode of iritis.  FE was done on June 16, 2010, and the patient was initially on G2 diet.  Today, she has been advanced to D3 diet thin liquids.  Rehab team has been following along and felt that she would benefit from inpatient rehab program.  PAST MEDICAL HISTORY:  Significant for polysubstance abuse including IV drugs.  History of hepatitis C with question of cirrhosis, anemia, cholecystectomy, T and A, DDD lumbar spine 10+ years.  ALLERGIES:  No known drug allergies.  REVIEW OF SYMPTOMS:  Positive for lumbago, weakness, diarrhea, and left pupil dyscoria.  FAMILY HISTORY:  Positive for cancer, question of coronary artery disease.  SOCIAL HISTORY:  The patient is single, was living with different friends prior to admission.  Plans are to discharge with family, specially daughter with sister assisting p.r.n.  Daughter's home is 1- level with two steps at entry.  The patient smoked one pack per day. Used cocaine occasionally.  Positive marijuana use.  No alcohol use.  FUNCTIONAL HISTORY:  The patient was independent prior to May of this year.  FUNCTIONAL STATUS:  The patient is supervision with  cues for grooming. Able to sit at edge of bed with contact guard assist for balance.  She is min assist for transfers, min assist ambulating 300 feet with rollator and mild ataxia.  PHYSICAL EXAMINATION:  VITAL SIGNS:  Blood pressure 124/78, pulse 84, respiratory rate 16. GENERAL:  The patient is thin female, appears older than stated age, sitting in bed, flat affect. HEENT:  Pupils reactive to light, some dilation noted in left pupil. Oral mucosa is dry with multiple dental caries and broken teeth noted. NECK:  Supple  without JVD or lymphadenopathy. LUNGS:  Show decreased sounds right base, otherwise clear. HEART:  Shows positive murmur with click on auscultation.  Rate is regular. EXTREMITIES:  Show no evidence of clubbing, cyanosis or edema. SKIN:  Chest wall incision is clean, dry, intact, healing well. ABDOMEN:  Soft, nontender with dried material around PEG site. NEUROLOGIC:  Cranial nerves II-XII grossly intact.  Reflexes 1+. Sensation grossly intact to light touch and pinprick in all four extremities.  Judgment fair for basic information.  She is able to follow simple commands and answer simple questions.  She is oriented to place with extra time and the reason why she is here.  Memory is fair for short-term information.  Strength is 3+-4/5 upper extremity proximal and distal.  Lower extremity strength is 2+/5 proximal at hip with hip flexion, 3/5 at knee at 3+/5 ankle dorsiflexion, plantar flexion.  HOSPITAL COURSE:  [name] was admitted to rehab on July 22, 2010, for inpatient therapies to consist of PT, OT and speech therapy at least 3 hours 5 days a week.  Past admission physiatrist rehab RN and therapy team have worked together to provide customized collaborative interdisciplinary care.  Rehab RN has been working with the patient on bowel and bladder program as well as wound care and monitoring.  The patient was maintained on heart healthy diet and initially CBGs were checked on a.c. and bedtime basis.  Blood sugars were noted to be ranging from 90s to 110s.  Hemoglobin A1c was normal at 5.6.  The patient's was p.o. intake was at 100%.  Tube feeds were discontinued and CBG checks were discontinued.  Weights have been checked on every other day and these have been around 69 kg.  Blood pressures have been checked on b.i.d. basis and were noted to be on the low side ranging from 90s to 110s.  The patient has been weaned off Catapress by time of discharge.  Blood pressures at the time  of discharge ranging from 90-102 systolics, 60s diastolic.  Heart rate stable in 70s to 80s range.  The patient's p.o. intake has been good.  She has been continent of bowel and bladder.  Chest wall incisions are healing well without any signs or symptoms of infection.  The patient without any evidence of fluid overload.  Check of lytes at admission revealed sodium 135, potassium 3.8, chloride 97, CO2 28, BUN 20, creatinine less than 0.47, glucose 170.  Urine culture done shows no growth.  The patient has had some complaints of neuropathy with pain in bilateral hands and bilateral feet.  She was started on Voltaren gel as well as Neurontin for her pain symptomatology.  Back pain has been treated with the use of lidocaine patches and p.r.n. Ultram.  During the patient's stay in rehab, weekly team conferences were held to monitor the patient's progress, set goals as well as discuss barriers to discharge.  At admission, the patient was noted to be  deconditioned with significant truncal and bilateral lower extremity weakness right greater than left.  She was noted to have decrease in muscular and cardiorespiratory endurance, decreased emergent awareness with decreased ability to maintain sternal precautions.  She was noted to have mild cognitive impairments with decrease short-term memory, decreased basic problem solving as well as decreased overall safety.  Currently, the patient is back to her cognitive baseline and is modified independent level for functional and familiar tasks.  She will require supervision for medication management at home and reports family will assist with this.  She is tolerating a regular diet, thin liquids via straw without overt signs or symptoms of aspiration.  OT has worked with the patient on ADL tasks.  At admission, the patient was at moderate to total assist for ADL tasks due to decreased balance, severe deconditioning, and decrease in fine motor strength  and coordination.  Currently, the patient is modified independent for all ADLs including grooming.  She has been given home exercise plan as well as therapy to work on hand strengthening.  Further followup skilled OT to continue to focus on overall strength, activity tolerance as well as fine motor strengthening.  The patient was initially at min assist for all mobility.  She is currently at supervision level for all gait transfers and mobility.  She continues to require rolling walker due to decreased activity tolerance and back pain.  Family has been educated regarding supervision needed at home and further followup physical therapy to continue to address activity tolerance as well as strengthening.  On August 02, 2010, the patient is discharged to home.  DISCHARGE MEDICATIONS: 1. Tylenol 325-650 mg p.o. q.4 h. p.r.n. for pain. 2. Albuterol inhaler 2 puffs 4 times per day p.r.n. shortness of     breath. 3. Amiodarone 200 mg p.o. per day. 4. Aspirin 81 mg a day. 5. Voltaren gel to hands t.i.d. 6. Eucerin to bilateral lower extremity b.i.d. 7. Folic acid 1 mg p.o. per day. 8. Lasix 20 mg p.o. b.i.d. 9. Gabapentin 300 mg p.o. b.i.d. 10.Levothyroxine 25 mcg p.o. per day. 11.Lidocaine patches to back on at 8:00 a.m., off at 8:00 p.m. daily. 12.Mag-Ox 400 mg b.i.d. 13.Multivitamin once a day. 14.K-Dur 10 mEq 2 pills p.o. b.i.d. 15.Thiamine 100 mg p.o. per day. 16.Ultram 50 mg p.o. 4 times per day p.r.n. moderate-to-severe pain.  DIET:  Low salt.  ACTIVITY LEVEL:  Twenty-four-hour supervision.  No strenuous activity.  SPECIAL INSTRUCTIONS:  No driving.  No alcohol.  Limited weightbearing to bilateral arms to 8-10 pounds.  No raising was out above the head. [facility] to provide home health PT/OT.  FOLLOWUP:  The patient to follow up with Dr. Riley Kill as needed.  Follow up with Dr. Cheyenne Adas and Dr. Theophilus Bones in 2 weeks.  Follow with primary care at [facility] in the next  week.     Delle Reining, P.A.   ______________________________ Ranelle Oyster, M.D.   PL/MEDQ  D:  08/02/2010  T:  08/03/2010  Job:  [id number]  cc:   Beverely Pace., MD  [facility]  Electronically Signed by Osvaldo Shipper. on 08/05/2010 05:23:56 PM Electronically Signed by Faith Rogue M.D. on 08/21/2010 09:51:17 AM

## 2010-10-03 LAB — CBC
HCT: 37.9
MCV: 93.6
Platelets: 250
RDW: 13.6

## 2010-10-03 LAB — I-STAT 8, (EC8 V) (CONVERTED LAB)
Acid-Base Excess: 4 — ABNORMAL HIGH
Bicarbonate: 29.9 — ABNORMAL HIGH
HCT: 42
Operator id: 294521
pCO2, Ven: 47.9

## 2010-10-03 LAB — DIFFERENTIAL
Basophils Absolute: 0
Eosinophils Absolute: 0.1
Eosinophils Relative: 1

## 2010-10-10 LAB — DIFFERENTIAL
Basophils Relative: 1
Lymphs Abs: 1.8
Monocytes Absolute: 0.6
Monocytes Relative: 10
Neutro Abs: 3.3

## 2010-10-10 LAB — COMPREHENSIVE METABOLIC PANEL
Alkaline Phosphatase: 45
BUN: 9
CO2: 27
GFR calc non Af Amer: >60
Glucose, Bld: 85
Potassium: 3.7
Total Bilirubin: 1
Total Protein: 6.8

## 2010-10-10 LAB — CBC
Hemoglobin: 14.2
MCHC: 34
MCV: 93
RBC: 4.49

## 2010-10-10 LAB — URINALYSIS, ROUTINE W REFLEX MICROSCOPIC
Bilirubin Urine: NEGATIVE
Hgb urine dipstick: NEGATIVE
Nitrite: NEGATIVE
Specific Gravity, Urine: 1.02
pH: 6

## 2010-10-10 LAB — LIPASE, BLOOD: Lipase: 19

## 2010-10-23 ENCOUNTER — Encounter: Payer: Self-pay | Admitting: Cardiovascular Disease

## 2010-10-23 ENCOUNTER — Encounter: Payer: Self-pay | Admitting: *Deleted

## 2010-10-24 ENCOUNTER — Ambulatory Visit: Payer: Medicaid Other | Admitting: Cardiovascular Disease

## 2010-10-24 ENCOUNTER — Encounter: Payer: Self-pay | Admitting: Cardiovascular Disease

## 2010-10-24 ENCOUNTER — Ambulatory Visit (INDEPENDENT_AMBULATORY_CARE_PROVIDER_SITE_OTHER): Payer: Medicaid Other | Admitting: Cardiovascular Disease

## 2010-10-24 DIAGNOSIS — F1910 Other psychoactive substance abuse, uncomplicated: Secondary | ICD-10-CM

## 2010-10-24 DIAGNOSIS — I071 Rheumatic tricuspid insufficiency: Secondary | ICD-10-CM | POA: Insufficient documentation

## 2010-10-24 DIAGNOSIS — I079 Rheumatic tricuspid valve disease, unspecified: Secondary | ICD-10-CM

## 2010-10-24 DIAGNOSIS — E039 Hypothyroidism, unspecified: Secondary | ICD-10-CM

## 2010-10-24 DIAGNOSIS — G969 Disorder of central nervous system, unspecified: Secondary | ICD-10-CM | POA: Insufficient documentation

## 2010-10-24 DIAGNOSIS — I369 Nonrheumatic tricuspid valve disorder, unspecified: Secondary | ICD-10-CM

## 2010-10-24 DIAGNOSIS — I340 Nonrheumatic mitral (valve) insufficiency: Secondary | ICD-10-CM | POA: Insufficient documentation

## 2010-10-24 DIAGNOSIS — I059 Rheumatic mitral valve disease, unspecified: Secondary | ICD-10-CM

## 2010-10-24 DIAGNOSIS — I4892 Unspecified atrial flutter: Secondary | ICD-10-CM

## 2010-10-24 NOTE — Assessment & Plan Note (Signed)
Headaches but no neuro sequelae.  Would not appear to be a candidate for anticoagulation.  Consider F/U MRI or CT

## 2010-10-24 NOTE — Progress Notes (Signed)
Addended by: Wendall Stade on: 10/24/2010 09:38 PM   Modules accepted: Level of Service

## 2010-10-24 NOTE — Progress Notes (Addendum)
History of Present Illness: Primary Cardiologist:  Dr. Charlton Haws Saw [place] Neighbor while at [facility]  Reviewed numerous records from [facility] [place] and [facility] inpatient rehab  Time reviewing old records alone 30 minutes  [name] is a 40 y.o. female who presents as a new patient for post hospital follow up.  She is seen today for Dr. Eden Emms.  She has a h/o HCV and polysubstance abuse. She had mitral valve and tricuspid valve replacement on 07/05/10 by Dr. Silvestre Mesi at [facility] secondary to endocarditis.  She was originally admitted to [facility] 5/29 with sepsis.  Echo demonstrated vegetations on the TV and MV with moderate MR and moderate TR.  Blood cultures grew out MSSA.  She had multi-organ system failure, was intubated secondary to ARDS and was tx to [facility].  She had elevated cardiac enzymes, suffered a left fronto-parietal subarachnoid hemorrhage, pulmonary infarcts, ARF requiring brief hemodialysis.  She was tx back to [facility] at one point and then was tx to [facility] for MVR and TVR.  She had pericardial tissue valves.  She had AFlutter post op and was placed on amiodarone.  She was tx to [facility] for inpatient rehab.  She was there 7/9-7/20.  She has completed outpatient rehab.  She still needs a PCP.  She never had any dental work done.  She notes some hx of IVDA.  She also had a UTI prior to getting sick.  She has been out of amiodarone and synthroid for the last several weeks.  Her chest is sore.  She denies syncope.  She has some lightheadedness if she stands too long.  Notes some mild sob with class 2-2b symptoms.  No orthopnea, PND.  She has occasional ankle edema.  She is only taking PRN Lasix.  There was also a question of CNS hemorage with transfer to [facility] for ? Of surgery which was deemed not necessary.  No neuro F/U  Currently denies substance abuse.  SBE presumed from previous kidney infection and iv drug use.  Still with poor dentition in need of repair.  Has medicaid at this time but not prior to surgery  Past Medical  History  Diagnosis Date  . Bacterial endocarditis     s/p MVR and TVR at [facility] 06/2010; normal cors by cath 06/2010 at [facility]  . DJD (degenerative joint disease)   . Hepatitis C     history of  . Cirrhosis     question of  . Anemia   . Hypothyroidism     Current Outpatient Prescriptions  Medication Sig Dispense Refill  . aspirin 81 MG tablet Take 81 mg by mouth daily.        [name] gabapentin (NEURONTIN) 300 MG capsule Take 300 mg by mouth 2 (two) times daily.        . Naproxen Sodium (ALEVE) 220 MG CAPS Take by mouth as needed.        . Potassium 99 MG TABS Take 1 tablet by mouth daily.          Allergies: No Known Allergies  Social Hx:  Still smoking cigs.  Denies drug abuse  ROS:  Please see the history of present illness.  All other systems reviewed and negative.   Vital Signs: BP 110/80  Pulse 99  Resp 18  Ht 5\' 4"  (1.626 m)  Wt 153 lb (69.4 kg)  BMI 26.26 kg/m2  PHYSICAL EXAM: Well nourished, well developed, in no acute distress HEENT: normal Neck: no JVD Cardiac:  normal S1, S2;  RRR; no murmur Chest: sternotomy scar well healed without erythema or discharge Lungs:  clear to auscultation bilaterally, no wheezing, rhonchi or rales Abd: soft, nontender, no hepatomegaly Ext: no edema Skin: warm and dry Neuro:  CNs 2-12 intact, no focal abnormalities noted  EKG:  NSR, HR 92, normal axis, IVCD, PRWP, first degree AVB with PR 222 ms, prolonged QT with QTc 502 ms, TW inversions in V5-6 (no old to compare with)  ASSESSMENT AND PLAN:

## 2010-10-24 NOTE — Assessment & Plan Note (Signed)
Maintaining NSR.  No need to continue Amiodarone.  BP not high enough for her to tolerate even low dose beta blocker.  Remain on ASA.

## 2010-10-24 NOTE — Assessment & Plan Note (Signed)
Denies recurrent use.

## 2010-10-24 NOTE — Patient Instructions (Addendum)
Your physician wants you to follow-up in: 6 MONTHS WITH DR. Eden Emms. You will receive a reminder letter in the mail two months in advance. If  you don't receive a letter, please call our office to schedule the follow-up appointment.  Your physician has recommended you make the following change in your medication: STOP AMIODARONE; CHANGE LASIX TO 20 MG 1 TABLET DAILY ONLY WHEN NEEDED FOR SWELLING; POTASSIUM 20 MEQ 1 TABLET DAILY ONLY WHEN YOU TAKE YOUR LASIX.  You have been referred to [facility] AND ALSO DR. Kristin Bruins (DENTIST) [phone] PER DR. Eden Emms.  Your physician recommends that you return for lab work in: 10/25/10 FOR BMET, TSH 424.2 VALVE DISORERS

## 2010-10-24 NOTE — Assessment & Plan Note (Signed)
Recheck TSH.  If normal, follow.  If high, restart Synthroid.  Refer to PCP.

## 2010-10-24 NOTE — Assessment & Plan Note (Signed)
Secondary to SBE.  Status post MVR and TVR at [facility].  Patient with very complicated pre and post op course.  All records reviewed.  Patient also seen by Dr. Eden Emms.  Get baseline echo.  Refer to dental to care for teeth as dentition is poor.  Get BMET today.  Follow up with Dr. Eden Emms in 6 mos.

## 2010-10-29 LAB — I-STAT 8, (EC8 V) (CONVERTED LAB)
BUN: 10
Bicarbonate: 23.1
HCT: 49 — ABNORMAL HIGH
Hemoglobin: 16.7 — ABNORMAL HIGH
Operator id: 288831
Sodium: 135
TCO2: 24
pCO2, Ven: 39.3 — ABNORMAL LOW

## 2010-10-29 LAB — POCT I-STAT CREATININE: Creatinine, Ser: 0.9

## 2010-12-06 ENCOUNTER — Encounter (HOSPITAL_COMMUNITY): Payer: Self-pay | Admitting: *Deleted

## 2010-12-06 ENCOUNTER — Emergency Department (HOSPITAL_COMMUNITY)
Admission: EM | Admit: 2010-12-06 | Discharge: 2010-12-07 | Payer: Medicaid Other | Attending: Emergency Medicine | Admitting: Emergency Medicine

## 2010-12-06 DIAGNOSIS — H5710 Ocular pain, unspecified eye: Secondary | ICD-10-CM | POA: Insufficient documentation

## 2010-12-06 DIAGNOSIS — H5789 Other specified disorders of eye and adnexa: Secondary | ICD-10-CM | POA: Insufficient documentation

## 2010-12-06 DIAGNOSIS — Z8619 Personal history of other infectious and parasitic diseases: Secondary | ICD-10-CM | POA: Insufficient documentation

## 2010-12-06 DIAGNOSIS — Z79899 Other long term (current) drug therapy: Secondary | ICD-10-CM | POA: Insufficient documentation

## 2010-12-06 DIAGNOSIS — H20052 Hypopyon, left eye: Secondary | ICD-10-CM

## 2010-12-06 DIAGNOSIS — E039 Hypothyroidism, unspecified: Secondary | ICD-10-CM | POA: Insufficient documentation

## 2010-12-06 DIAGNOSIS — H11419 Vascular abnormalities of conjunctiva, unspecified eye: Secondary | ICD-10-CM | POA: Insufficient documentation

## 2010-12-06 DIAGNOSIS — R221 Localized swelling, mass and lump, neck: Secondary | ICD-10-CM | POA: Insufficient documentation

## 2010-12-06 DIAGNOSIS — Z8673 Personal history of transient ischemic attack (TIA), and cerebral infarction without residual deficits: Secondary | ICD-10-CM | POA: Insufficient documentation

## 2010-12-06 DIAGNOSIS — Z7982 Long term (current) use of aspirin: Secondary | ICD-10-CM | POA: Insufficient documentation

## 2010-12-06 DIAGNOSIS — H539 Unspecified visual disturbance: Secondary | ICD-10-CM | POA: Insufficient documentation

## 2010-12-06 DIAGNOSIS — R220 Localized swelling, mass and lump, head: Secondary | ICD-10-CM | POA: Insufficient documentation

## 2010-12-06 DIAGNOSIS — H53149 Visual discomfort, unspecified: Secondary | ICD-10-CM | POA: Insufficient documentation

## 2010-12-06 DIAGNOSIS — H20059 Hypopyon, unspecified eye: Secondary | ICD-10-CM | POA: Insufficient documentation

## 2010-12-06 HISTORY — DX: Cerebral infarction, unspecified: I63.9

## 2010-12-06 LAB — BASIC METABOLIC PANEL
BUN: 16 mg/dL (ref 6–23)
Calcium: 10.1 mg/dL (ref 8.4–10.5)
GFR calc Af Amer: >90 mL/min (ref >90)
GFR calc non Af Amer: 83 mL/min — ABNORMAL LOW (ref >90)
Potassium: 3.9 mEq/L (ref 3.5–5.1)
Sodium: 136 mEq/L (ref 135–145)

## 2010-12-06 LAB — CBC
HCT: 46.5 % — ABNORMAL HIGH (ref 36.0–46.0)
MCHC: 35.1 g/dL (ref 30.0–36.0)
RDW: 15.1 % (ref 11.5–15.5)

## 2010-12-06 MED ORDER — FLUORESCEIN SODIUM 1 MG OP STRP
1.0000 | ORAL_STRIP | Freq: Once | OPHTHALMIC | Status: AC
  Administered 2010-12-06: 1 via OPHTHALMIC
  Filled 2010-12-06 (×3): qty 1

## 2010-12-06 MED ORDER — TETRACAINE HCL 0.5 % OP SOLN
OPHTHALMIC | Status: AC
  Administered 2010-12-06: 21:00:00
  Filled 2010-12-06: qty 2

## 2010-12-06 MED ORDER — FLUORESCEIN SODIUM 1 MG OP STRP
ORAL_STRIP | OPHTHALMIC | Status: AC
  Administered 2010-12-06: 21:00:00 via OTIC
  Filled 2010-12-06: qty 1

## 2010-12-06 MED ORDER — PHENYLEPHRINE HCL 2.5 % OP SOLN
1.0000 [drp] | Freq: Once | OPHTHALMIC | Status: DC
  Filled 2010-12-06: qty 3

## 2010-12-06 MED ORDER — CYCLOPENTOLATE HCL 1 % OP SOLN
1.0000 [drp] | Freq: Once | OPHTHALMIC | Status: AC
  Administered 2010-12-06: 1 [drp] via OPHTHALMIC

## 2010-12-06 MED ORDER — OXYCODONE-ACETAMINOPHEN 5-325 MG PO TABS
1.0000 | ORAL_TABLET | Freq: Once | ORAL | Status: AC
  Administered 2010-12-06: 1 via ORAL
  Filled 2010-12-06: qty 1

## 2010-12-06 MED ORDER — CYCLOPENTOLATE HCL 1 % OP SOLN
1.0000 [drp] | Freq: Once | OPHTHALMIC | Status: AC
  Administered 2010-12-06: 1 [drp] via OPHTHALMIC
  Filled 2010-12-06: qty 2

## 2010-12-06 NOTE — ED Notes (Signed)
Patient states she cannot see anything out of her left eye and she has increased pain.  She has hx of cva when the sx started

## 2010-12-06 NOTE — ED Notes (Signed)
Pt laughing with s/o in room about "something funny". Pt stated still having pain and need further pain med. Dr. Tracie Harrier not in dept. At present and no new orders. Pt. Stated has pain with lights on; lights are turned off.

## 2010-12-06 NOTE — ED Provider Notes (Signed)
40 year old, female with history of iv  drug abuse , complicated by an endocarditis, and septic emboli, presents to the emergency department with left thigh pain, redness, and decreased vision for several days.  On examination.  She is tachycardic, afebrile without a cardiac murmur.  Her left conjunctiva is injected.  There is no obvious hypopyon, and an irregular pupil.  Ophthalmology consult obtained in the emergency department.  The patient was seen by Dr. Harlon Flor in emergency department.  He recommended transfer to [facility].  We contacted [facility], and they accepted the patient for transfer there for further evaluation of hypopyon in the left eye.  Nicholes Stairs, MD 12/07/10 [phone]

## 2010-12-06 NOTE — ED Provider Notes (Signed)
History     CSN: [id number] Arrival date & time: 12/06/2010  2:05 PM   First MD Initiated Contact with Patient 12/06/10 1828      Chief Complaint  Patient presents with  . Eye Pain  . Facial Swelling    (Consider location/radiation/quality/duration/timing/severity/associated sxs/prior treatment) Patient is a 40 y.o. female presenting with eye pain. The history is provided by the patient.  Eye Pain The current episode started in the past 7 days (acute worsening yesterday). The problem occurs constantly. The problem has been gradually worsening. Pertinent negatives include no chest pain, chills, congestion, coughing, fever, headaches, nausea, neck pain, rash or vomiting. The symptoms are aggravated by nothing. She has tried nothing for the symptoms. The treatment provided no relief.  No injury to eye. Prior poor vision since May, secondary to multiple complications due to endocarditis with septic emboli, and resulting stroke.  Past Medical History  Diagnosis Date  . Bacterial endocarditis     s/p MVR and TVR at [facility] 06/2010; normal cors by cath 06/2010 at [facility]  . DJD (degenerative joint disease)   . Hepatitis C     history of  . Cirrhosis     question of  . Anemia   . Hypothyroidism   . Stroke     Past Surgical History  Procedure Date  . Cholecystectomy   . Tonsillectomy and adenoidectomy   . Valve replacement   . Appendectomy     Family History  Problem Relation Age of Onset  . Cancer    . Coronary artery disease      History  Substance Use Topics  . Smoking status: Current Everyday Smoker  . Smokeless tobacco: Not on file   Comment: somkes about 1/2 pack a day  . Alcohol Use: No    OB History    Grav Para Term Preterm Abortions TAB SAB Ect Mult Living                  Review of Systems  Constitutional: Negative for fever and chills.  HENT: Negative for ear pain, congestion, rhinorrhea and neck pain.   Eyes: Positive for photophobia, pain, redness and  visual disturbance. Negative for itching.  Respiratory: Negative for cough and shortness of breath.   Cardiovascular: Negative for chest pain.  Gastrointestinal: Negative for nausea and vomiting.  Skin: Negative for color change and rash.  Neurological: Negative for light-headedness and headaches.  All other systems reviewed and are negative.    Allergies  Review of patient's allergies indicates no known allergies.  Home Medications   Current Outpatient Rx  Name Route Sig Dispense Refill  . ASPIRIN 81 MG PO TABS Oral Take 81 mg by mouth daily.      . FUROSEMIDE 20 MG PO TABS Oral Take 20 mg by mouth daily.     [name] GABAPENTIN 300 MG PO CAPS Oral Take 300 mg by mouth 2 (two) times daily.      [name] NAPROXEN SODIUM 220 MG PO CAPS Oral Take 220 mg by mouth 2 (two) times daily as needed. For pain.    [name] POTASSIUM 99 MG PO TABS Oral Take 1 tablet (99 mg total) by mouth daily as needed. TAKE WHEN YOU TAKE YOUR LASIX      BP 108/69  Pulse 103  Temp(Src) 98.4 F (36.9 C) (Oral)  Resp 20  SpO2 98%  Physical Exam  Nursing note and vitals reviewed. Constitutional: She is oriented to person, place, and time. She appears well-developed and well-nourished.  HENT:  Head: Normocephalic and atraumatic.  Eyes: No foreign body present in the left eye. Left conjunctiva is injected. Left eye exhibits normal extraocular motion and no nystagmus. Left pupil is not reactive.  Slit lamp exam:      The left eye shows hypopyon. The left eye shows no corneal abrasion, no corneal ulcer and no fluorescein uptake.       Several areas of whitish discoloration in L pupil; IOP 27 in L eye.  Cardiovascular: Normal rate, regular rhythm and normal heart sounds.   Pulmonary/Chest: Effort normal and breath sounds normal. No respiratory distress.  Abdominal: Soft. There is no tenderness.  Lymphadenopathy:    She has no cervical adenopathy.  Neurological: She is alert and oriented to person, place, and time.  Skin: Skin  is warm and dry.  Psychiatric: She has a normal mood and affect.    ED Course  Procedures (including critical care time)  Labs Reviewed  CBC - Abnormal; Notable for the following:    WBC 11.4 (*)    Hemoglobin 16.3 (*)    HCT 46.5 (*)    All other components within normal limits  BASIC METABOLIC PANEL - Abnormal; Notable for the following:    GFR calc non Af Amer 83 (*)    All other components within normal limits  URINE RAPID DRUG SCREEN (HOSP PERFORMED)   No results found.   1. Hypopyon of left eye       MDM  40 yo F presents with gradual worsening of L eye pain, decreased vision, and photophobia x1 week; has gotten significantly worse since yesterday. Denies any injuries, surgeries to eye; states that since stroke, has had decreased vision in the L eye. Currently only able to see hand movement, pupil non reactive, significant conjunctival injection, and hypopion visible with naked eye. Spoke with on call ophthalmologist, who will come to see pt; requests investigation into underlying cause, since stroke was due to endocarditis secondary to IVDU. Records here state cultures grew MSSA, for which patient was treated with nafcillin x6 weeks, but will try to get records from [place] and [facility] where pt was treated.  Optho (has seen patient here, and thinks that there are some signs of chronic changes, as well as visible hypopyon; unable to examine posterior chamber of eye. Without any recent injury or surgery, has unknown etiology as cause of hypopyon; states could be related to prior endocarditis and initial problems. His recommendation is for transfer back to [facility], for evaluation by their ophthalmologists, with possible B-scan of eye, and possible need for vitreal tap and antibiotics if necessary. Spoke with Dr Maretta Bees with [facility], who accepted the patient. Discussed with patient indication for transfer, as well as risks and benefits, and patient expresses agreement with  plan.      Theotis Burrow, MD 12/07/10 [phone]

## 2010-12-06 NOTE — ED Notes (Signed)
Pt has 20/20 vision in rt eye, but no vision on lt eye

## 2010-12-06 NOTE — ED Notes (Signed)
r eye 20/20 l eye with light perception per Dr Elsworth Soho

## 2010-12-06 NOTE — Consult Note (Signed)
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  Recommendation:  Referring Physician:   SHEARON CLONCH is an 40 y.o. female.  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 In that hospitalization the patient had an eye examination while intubated and it was noted that the pupil  left eye was fixed and nonreactive. The patient was to have ophthalmology followup after discharge from the hospital however this did not occur.  Past Medical History  Diagnosis Date  . Bacterial endocarditis     s/p MVR and TVR at [facility] 06/2010; normal cors by cath 06/2010 at [facility]  . DJD (degenerative joint disease)   . Hepatitis C     history of  . Cirrhosis     question of  . Anemia   . Hypothyroidism   . Stroke     Past Surgical History  Procedure Date  . Cholecystectomy   . Tonsillectomy and adenoidectomy   . Valve replacement   . Appendectomy     Family History  Problem Relation Age of Onset  . Cancer    . Coronary artery disease      Social History:  reports that she has been smoking.  She does not have any smokeless tobacco history on file. She reports that she uses illicit drugs (Marijuana and Cocaine). She reports that she does not drink alcohol.  Allergies: No Known Allergies  Medications: I have reviewed the patient's current medications.  Results for orders placed during the hospital encounter of 12/06/10  (from the past 48 hour(s))  CBC     Status: Abnormal   Collection Time   12/06/10  7:55 PM      Component Value Range Comment   WBC 11.4 (*) 4.0 - 10.5 (K/uL)    RBC 5.07  3.87 - 5.11 (MIL/uL)    Hemoglobin 16.3 (*) 12.0 - 15.0 (g/dL)    HCT 30.8 (*) 65.7 - 46.0 (%)    MCV 91.7  78.0 - 100.0 (fL)    MCH 32.1  26.0 - 34.0 (pg)    MCHC 35.1  30.0 - 36.0 (g/dL)    RDW 84.6  96.2 - 95.2 (%)    Platelets 239  150 - 400 (K/uL)   BASIC METABOLIC PANEL     Status: Abnormal   Collection Time   12/06/10  7:55 PM      Component Value Range Comment   Sodium 136  135 - 145 (mEq/L)    Potassium 3.9  3.5 - 5.1 (mEq/L)    Chloride 100  96 - 112 (mEq/L)    CO2 23  19 - 32 (mEq/L)    Glucose, Bld 87  70 - 99 (mg/dL)    BUN 16  6 - 23 (mg/dL)    Creatinine, Ser 8.41  0.50 - 1.10 (mg/dL)    Calcium 32.4  8.4 - 10.5 (mg/dL)    GFR calc non Af Amer 83 (*) >90 (mL/min)  GFR calc Af Amer >90  >90 (mL/min)     No results found.  ROS Blood pressure 128/85, pulse 99, temperature 98.4 F (36.9 C), temperature source Oral, resp. rate 20, SpO2 99.00%. Physical Exam  A slitlamp in the [facility] emergency department does not have a tonometer tip it is not possible to do applanation tonometry Tono-Pen revealed intraocular pressure right eye of 14 mmHg left eye reading was not accurate appear to be less than 10  Examination: Visual acuity right eye 20/25 left eye light perception External exam ocular adnexa: Right eye normal left eye guarding ptosis Slit-lamp examination remarkable for conjunctiva right eye quiet left +3 injection Cornea right eye clear left eye endothelial KP Anterior chamber right eye deep and quiet left eye deep with layered inferior hypopyon the hypopyon was visible with the naked eye by the ED doctor Iris gray both eyes Lens right eye clear left eye with anterior opacity and posterior synechia Pupils right eye normal reaction left eye nonreactive Dilation phenylephrine 2.5%  and tropicamide 1% Dilated examination fundus right eye retina flat and attached vessels appeared to be normal optic nerve with 50% cupping macula clear with normal fovea It was not possible to visualize the fundus left eye primarily from patient cooperation however the lens has a significant opacity in the pupil did not dilate left eye Assessment/Plan: 1 eye pain left eye associated with #2 hypopyon left eye associated with #3 previous heart valve replacement for infectious endocarditis It is very likely that this patient has 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  The patient needs a B-scan of the left eye to determine if there vitreous cells present she may also need a vitreous tap and intravitreal antibiotics. The patient also needs an echocardiogram of the heart and carotid arteries to check for possible vegetations on the heart valves. This would have to be done in conjunction with consultation with cardiology and infectious disease. The patient also needs to see a retina specialist for the vitreous tap and possible intravitreal antibiotics. This coordination of care can best be performed at [facility] where the patient has previously had a heart valve surgery and previous evaluation by ophthalmology. The patient has a very severe ocular or systemic condition which will require frequent followup with a subspecialties care as noted above which is best achieved at a tertiary care center.  Tajah Noguchi 12/06/2010, 10:55 PM

## 2010-12-07 LAB — RAPID URINE DRUG SCREEN, HOSP PERFORMED: Benzodiazepines: POSITIVE — AB

## 2010-12-07 NOTE — ED Notes (Signed)
Secretary just gave me number to  Call as found out that pt is to be transferred to [facility] for further evaluation and one minute later call  [facility]ame in from [facility] for report which was given. Stated that they are here at [facility] and will be in in a minute.

## 2010-12-07 NOTE — ED Notes (Signed)
Pt up ambulatory to br in hall w/o any gait probleems.

## 2010-12-07 NOTE — ED Provider Notes (Signed)
40 year old, female with history of iv  drug abuse , complicated by an endocarditis, and septic emboli, presents to the emergency department with left thigh pain, redness, and decreased vision for several days.  On examination.  She is tachycardic, afebrile without a cardiac murmur.  Her left conjunctiva is injected.  There is no obvious hypopyon, and an irregular pupil.  Ophthalmology consult obtained in the emergency department.  The patient was seen by Dr. Harlon Flor in emergency department.  He recommended transfer to [facility].  We contacted [facility], and they accepted the patient for transfer there for further evaluation of hypopyon in the left eye.  Nicholes Stairs, MD 12/07/10 1610   Nicholes Stairs, MD 12/07/10 [phone]

## 2011-05-12 ENCOUNTER — Telehealth: Payer: Self-pay | Admitting: Cardiovascular Disease

## 2011-05-12 NOTE — Telephone Encounter (Signed)
New Problem:     I called the number listed on the patients profile, and the phone just rang with out giving an option to leave a message.

## 2011-05-22 ENCOUNTER — Other Ambulatory Visit (HOSPITAL_COMMUNITY): Payer: Self-pay | Admitting: Family Medicine

## 2011-05-22 DIAGNOSIS — R569 Unspecified convulsions: Secondary | ICD-10-CM

## 2011-05-23 ENCOUNTER — Other Ambulatory Visit: Payer: Self-pay | Admitting: Family Medicine

## 2011-05-23 DIAGNOSIS — R569 Unspecified convulsions: Secondary | ICD-10-CM

## 2011-05-28 ENCOUNTER — Other Ambulatory Visit (HOSPITAL_COMMUNITY): Payer: Medicaid Other

## 2011-05-30 ENCOUNTER — Ambulatory Visit (HOSPITAL_COMMUNITY)
Admission: RE | Admit: 2011-05-30 | Discharge: 2011-05-30 | Disposition: A | Discharge status: Home / Self Care | Payer: Medicaid Other | Source: Ambulatory Visit | Attending: Family Medicine | Admitting: Family Medicine

## 2011-05-30 DIAGNOSIS — R569 Unspecified convulsions: Secondary | ICD-10-CM

## 2011-05-30 DIAGNOSIS — R51 Headache: Secondary | ICD-10-CM | POA: Insufficient documentation

## 2011-05-30 DIAGNOSIS — R42 Dizziness and giddiness: Secondary | ICD-10-CM | POA: Insufficient documentation

## 2011-05-30 NOTE — Procedures (Signed)
This routine EEG was requested in this 41 year old woman who has an extensive medical history including sepsis with multiorgan failure.  She also has headaches and dizziness and has had an episode of altered mental status with shaking and bladder incontinence.  Her medications include gabapentin.  The EEG was done with the patient awake, drowsy and asleep.  During periods of maximal wakefulness, she had a low to medium amplitude, well regulated, well sustained 9 cycle per second posterior dominant rhythm that attenuated with eye opening and was symmetric.  Background activity is characterized by low-amplitude beta activities that were anteriorly dominant and symmetric.    Photic stimulation produced a symmetric driving response.  Hyperventilation for 3 minutes with good effort did not markedly change the tracing.  The patient did become drowsy as characterized by an attenuation of the alpha rhythm as well as the appearance of slower symmetric delta and theta activities.  There is also noted to be the appearance of vertex sharp waves and symmetric sleep spindles.  CLINICAL INTERPRETATION:  This routine EEG done with the patient awake, drowsy, and asleep is normal.          ______________________________ Denton Meek, MD    JY:NWGN D:  05/30/2011 17:39:43  T:  05/30/2011 18:32:28  Job #:  [id number]

## 2011-06-02 ENCOUNTER — Ambulatory Visit
Admission: RE | Admit: 2011-06-02 | Discharge: 2011-06-02 | Disposition: A | Discharge status: Home / Self Care | Payer: Medicaid Other | Source: Ambulatory Visit | Attending: Family Medicine | Admitting: Family Medicine

## 2011-06-02 DIAGNOSIS — R569 Unspecified convulsions: Secondary | ICD-10-CM

## 2011-08-15 ENCOUNTER — Other Ambulatory Visit (HOSPITAL_COMMUNITY): Payer: Self-pay | Admitting: Oral Surgery

## 2011-08-18 ENCOUNTER — Encounter (HOSPITAL_COMMUNITY): Payer: Self-pay | Admitting: Pharmacy Technician

## 2011-08-27 NOTE — Pre-Procedure Instructions (Signed)
20 [name]  08/27/2011   Your procedure is scheduled on:  09/01/11  Report to [facility] at 800 AM.  Call this number if you have problems the morning of surgery: [phone]   Remember:   Do not eat food:After Midnight.     Take these medicines the morning of surgery with A SIP OF WATER: neurotin,synthroid,topamax   Do not wear jewelry, make-up or nail polish.  Do not wear lotions, powders, or perfumes. You may wear deodorant.  Do not shave 48 hours prior to surgery. Men may shave face and neck.  Do not bring valuables to the hospital.  Contacts, dentures or bridgework may not be worn into surgery.  Leave suitcase in the car. After surgery it may be brought to your room.  For patients admitted to the hospital, checkout time is 11:00 AM the day of discharge.   Patients discharged the day of surgery will not be allowed to drive home.  Name and phone number of your driver: family  Special Instructions: CHG Shower Use Special Wash: 1/2 bottle night before surgery and 1/2 bottle morning of surgery.   Please read over the following fact sheets that you were given: Pain Booklet, Coughing and Deep Breathing and Surgical Site Infection Prevention

## 2011-08-28 ENCOUNTER — Encounter (HOSPITAL_COMMUNITY)
Admission: RE | Admit: 2011-08-28 | Discharge: 2011-08-28 | Disposition: A | Discharge status: Home / Self Care | Payer: Medicaid Other | Source: Ambulatory Visit | Attending: Oral Surgery | Admitting: Oral Surgery

## 2011-08-28 ENCOUNTER — Encounter (HOSPITAL_COMMUNITY): Payer: Self-pay

## 2011-08-28 HISTORY — DX: Anxiety disorder, unspecified: F41.9

## 2011-08-28 HISTORY — DX: Mental disorder, not otherwise specified: F99

## 2011-08-28 LAB — BASIC METABOLIC PANEL
CO2: 18 mEq/L — ABNORMAL LOW (ref 19–32)
Calcium: 9.2 mg/dL (ref 8.4–10.5)
Chloride: 104 mEq/L (ref 96–112)
Glucose, Bld: 109 mg/dL — ABNORMAL HIGH (ref 70–99)
Sodium: 137 mEq/L (ref 135–145)

## 2011-08-28 LAB — CBC
MCHC: 35.9 g/dL (ref 30.0–36.0)
Platelets: 189 10*3/uL (ref 150–400)
RDW: 13.2 % (ref 11.5–15.5)
WBC: 8.3 10*3/uL (ref 4.0–10.5)

## 2011-08-28 NOTE — Progress Notes (Signed)
Cath,echo,ekg notes from [facility] in epic. Will have Revonda Standard review re cardiac hx.

## 2011-08-28 NOTE — H&P (Signed)
HISTORY AND PHYSICAL  [name] is a 41 y.o. female patient with CC: painful teeth   No diagnosis found.  Past Medical History  Diagnosis Date  . Bacterial endocarditis     s/p MVR and TVR at [facility] 06/2010; normal cors by cath 06/2010 at [facility]  . DJD (degenerative joint disease)   . Hepatitis C     history of  . Anemia   . Hypothyroidism   . Stroke   . Hepatitis C   . Anxiety   . Mental disorder   . Headache     No current facility-administered medications for this encounter.   Current Outpatient Prescriptions  Medication Sig Dispense Refill  . aspirin 81 MG tablet Take 81 mg by mouth daily.        . furosemide (LASIX) 20 MG tablet Take 20 mg by mouth daily as needed. Swelling      . gabapentin (NEURONTIN) 400 MG capsule Take 400 mg by mouth every 8 (eight) hours.      [name] levothyroxine (SYNTHROID, LEVOTHROID) 50 MCG tablet Take 50 mcg by mouth daily.      . nortriptyline (PAMELOR) 25 MG capsule Take 25 mg by mouth at bedtime. Up to 6 per day for depression/pain      . Potassium 99 MG TABS Take 1 tablet (99 mg total) by mouth daily as needed. TAKE WHEN YOU TAKE YOUR LASIX      . prednisoLONE acetate (PRED FORTE) 1 % ophthalmic suspension Place 1 drop into the left eye 4 (four) times daily.      . rizatriptan (MAXALT-MLT) 5 MG disintegrating tablet Take 5 mg by mouth See admin instructions. Take 1 tablet as needed. Max 2 times a week.      . topiramate (TOPAMAX) 25 MG tablet Take 50 mg by mouth 2 (two) times daily.       No Known Allergies Active Problems:  * No active hospital problems. *   Vitals: There were no vitals taken for this visit. Lab results: Results for orders placed during the hospital encounter of 08/28/11 (from the past 24 hour(s))  BASIC METABOLIC PANEL     Status: Abnormal   Collection Time   08/28/11  8:43 AM      Component Value Range   Sodium 137  135 - 145 mEq/L   Potassium 3.8  3.5 - 5.1 mEq/L   Chloride 104  96 - 112 mEq/L   CO2 18 (*) 19 - 32  mEq/L   Glucose, Bld 109 (*) 70 - 99 mg/dL   BUN 13  6 - 23 mg/dL   Creatinine, Ser 1.61  0.50 - 1.10 mg/dL   Calcium 9.2  8.4 - 09.6 mg/dL   GFR calc non Af Amer 83 (*) >90 mL/min   GFR calc Af Amer >90  >90 mL/min  CBC     Status: Abnormal   Collection Time   08/28/11  8:43 AM      Component Value Range   WBC 8.3  4.0 - 10.5 K/uL   RBC 4.67  3.87 - 5.11 MIL/uL   Hemoglobin 15.3 (*) 12.0 - 15.0 g/dL   HCT 04.5  40.9 - 81.1 %   MCV 91.2  78.0 - 100.0 fL   MCH 32.8  26.0 - 34.0 pg   MCHC 35.9  30.0 - 36.0 g/dL   RDW 91.4  78.2 - 95.6 %   Platelets 189  150 - 400 K/uL   Radiology Results: Dg Chest  2 View  08/28/2011  *RADIOLOGY REPORT*  Clinical Data: Coronary artery disease.  Smoker.  Preop respiratory exam.  CHEST - 2 VIEW  Comparison: 07/22/2010  Findings: Mild scarring again seen in the right lung base.  Lungs otherwise clear.  No evidence of pleural effusion.  Heart size is normal.  No mass or lymphadenopathy identified.  Prior median sternotomy again noted.  IMPRESSION: Mild right basilar scarring.  No active disease.  Original Report Authenticated By: Danae Orleans, M.D.   General appearance: alert, cooperative and no distress Head: Normocephalic, without obvious abnormality, atraumatic Eyes: negative Ears: normal TM's and external ear canals both ears Nose: Nares normal. Septum midline. Mucosa normal. No drainage or sinus tenderness. Throat: gross decay teeth #'s 3, 4, 5, 8, 9, 10, 11, 12, 13, 14, 20, 30 Neck: no adenopathy and supple, symmetrical, trachea midline Resp: clear to auscultation bilaterally Cardio: regular rate and rhythm, S1, S2 normal, no murmur, click, rub or gallop GI: soft, non-tender; bowel sounds normal; no masses,  no organomegaly  Assessment: 41 yo wf Bacterial endocarditis, DJD, Hep C, anemia, hypothyroidism, stroke, anxiety, mental disorder with nonrestorable teeth #'s 3, 4, 5, 8, 9, 10, 11, 12, 13, 14, 20, 30.  Plan:Extraction teeth #'s 3, 4, 5, 8,  9, 10, 11, 12, 13, 14, 20, 30, alveoloplasty right and left maxilla. General anesthesia. Day surgery.   Georgia Lopes 08/28/2011

## 2011-08-29 NOTE — Consult Note (Signed)
Anesthesia chart review: Patient is a 41 year old female scheduled for multiple teeth extractions and alveoloplasty by Dr. Barbette Merino on 09/01/11.  History includes smoking, hepatitis C, polysubstance abuse (denies current use), anemia, stroke, anxiety, headache, hypothyroidism, bacterial endocarditis s/p MVR and TVR ([employer]) at [facility] 07/05/10 (Dr. Silvestre Mesi) with post-operative a-flutter treated successfully with Amiodarone.  She had normal coronaries by cath on 07/04/10 [facility]).  Apparently, she initially presented to [facility] in May 2012 with sepsis and endocarditis (MSSA).  She developed multiorgan system failure, required intubation secondary to ARDS and was transferred to [facility]. While there she suffered from a left frontoparietal subarachnoid hemorrhage, pulmonary infarcts and acute renal failure, requiring brief hemodialysis. Once stabilized , she was transferred back to [facility] and eventually to [facility] for her aortic valve surgery.    Her Primary Cardiologist is Dr. Eden Emms.  She was last seen on 10/24/10 by Tereso Newcomer, PA-C and Dr. Eden Emms who were the ones who actually referred her for dental restoration.  He recommended an echocardiogram; however, there is no evidence in Epic that one was done.  Shortly after her visit with Dr. Eden Emms she presented to [facility] ED with what was felt to be a left eye emboli infection.  She reports that she was transferred to [facility] for continued care, and is certain she had an echo done there that to her knowledge was "okay."  Unfortunately, she was not asked to sign a release of information at her PAT appointment, and she does not have access to a fax machine or transportation to come in to Short Stay today.  A STAT request will be made for her [facility] records once she signs a release of informationon on arrival @ 0800 on Monday.  (She is scheduled for OR at 1000.)  She reports that she tolerated left eye cataract extraction at a local out-patient surgical center within the past two months.   She denies chest pain or SOB.   (She does have some of her June 2012 notes from [facility] scanned into Epic under the Media tab.)  EKG on 10/24/2010 showed normal sinus rhythm with first-degree AV block, prolonged QT, lateral T wave abnormality, consider ischemia.  Chest x-ray on 08/28/2011 showed mild right basilar scarring, but no active disease.  Labs noted.  PLT 189.  With her history of Hepatitis C, will order PT/PTT and HFP for the day of surgery.  She is also scheduled to get a serum pregnancy test at that time.  Updated Anesthesiologist Dr. Jacklynn Bue.  Shonna Chock, PA-C

## 2011-09-01 ENCOUNTER — Encounter (HOSPITAL_COMMUNITY): Payer: Self-pay | Admitting: *Deleted

## 2011-09-01 ENCOUNTER — Encounter (HOSPITAL_COMMUNITY)
Admission: RE | Disposition: A | Discharge status: Home / Self Care | Payer: Self-pay | Source: Ambulatory Visit | Attending: Oral Surgery

## 2011-09-01 ENCOUNTER — Encounter (HOSPITAL_COMMUNITY): Payer: Self-pay | Admitting: Vascular Surgery

## 2011-09-01 ENCOUNTER — Ambulatory Visit (HOSPITAL_COMMUNITY)
Admission: RE | Admit: 2011-09-01 | Discharge: 2011-09-01 | Disposition: A | Discharge status: Home / Self Care | Payer: Medicaid Other | Source: Ambulatory Visit | Attending: Oral Surgery | Admitting: Oral Surgery

## 2011-09-01 ENCOUNTER — Ambulatory Visit (HOSPITAL_COMMUNITY): Payer: Medicaid Other | Admitting: Vascular Surgery

## 2011-09-01 DIAGNOSIS — F17200 Nicotine dependence, unspecified, uncomplicated: Secondary | ICD-10-CM | POA: Insufficient documentation

## 2011-09-01 DIAGNOSIS — K029 Dental caries, unspecified: Secondary | ICD-10-CM

## 2011-09-01 DIAGNOSIS — K089 Disorder of teeth and supporting structures, unspecified: Secondary | ICD-10-CM | POA: Insufficient documentation

## 2011-09-01 DIAGNOSIS — Z01812 Encounter for preprocedural laboratory examination: Secondary | ICD-10-CM | POA: Insufficient documentation

## 2011-09-01 DIAGNOSIS — Z954 Presence of other heart-valve replacement: Secondary | ICD-10-CM | POA: Insufficient documentation

## 2011-09-01 DIAGNOSIS — Z01818 Encounter for other preprocedural examination: Secondary | ICD-10-CM | POA: Insufficient documentation

## 2011-09-01 DIAGNOSIS — I340 Nonrheumatic mitral (valve) insufficiency: Secondary | ICD-10-CM

## 2011-09-01 DIAGNOSIS — F1910 Other psychoactive substance abuse, uncomplicated: Secondary | ICD-10-CM

## 2011-09-01 DIAGNOSIS — I071 Rheumatic tricuspid insufficiency: Secondary | ICD-10-CM

## 2011-09-01 DIAGNOSIS — E039 Hypothyroidism, unspecified: Secondary | ICD-10-CM

## 2011-09-01 DIAGNOSIS — F3289 Other specified depressive episodes: Secondary | ICD-10-CM | POA: Insufficient documentation

## 2011-09-01 DIAGNOSIS — F329 Major depressive disorder, single episode, unspecified: Secondary | ICD-10-CM | POA: Insufficient documentation

## 2011-09-01 DIAGNOSIS — G969 Disorder of central nervous system, unspecified: Secondary | ICD-10-CM

## 2011-09-01 DIAGNOSIS — I4892 Unspecified atrial flutter: Secondary | ICD-10-CM

## 2011-09-01 HISTORY — PX: MULTIPLE EXTRACTIONS WITH ALVEOLOPLASTY: SHX5342

## 2011-09-01 LAB — HEPATIC FUNCTION PANEL
ALT: 39 U/L — ABNORMAL HIGH (ref 0–35)
Alkaline Phosphatase: 65 U/L (ref 39–117)
Total Bilirubin: 0.2 mg/dL — ABNORMAL LOW (ref 0.3–1.2)
Total Protein: 7.4 g/dL (ref 6.0–8.3)

## 2011-09-01 SURGERY — MULTIPLE EXTRACTION WITH ALVEOLOPLASTY
Anesthesia: General | Site: Mouth | Wound class: Clean Contaminated

## 2011-09-01 MED ORDER — LIDOCAINE-EPINEPHRINE 2 %-1:100000 IJ SOLN
INTRAMUSCULAR | Status: AC
  Filled 2011-09-01: qty 1

## 2011-09-01 MED ORDER — LACTATED RINGERS IV SOLN
INTRAVENOUS | Status: DC | PRN
  Administered 2011-09-01: 10:00:00 via INTRAVENOUS

## 2011-09-01 MED ORDER — PROPOFOL 10 MG/ML IV EMUL
INTRAVENOUS | Status: DC | PRN
  Administered 2011-09-01: 110 mg via INTRAVENOUS

## 2011-09-01 MED ORDER — LACTATED RINGERS IV SOLN
INTRAVENOUS | Status: DC
  Administered 2011-09-01: 10:00:00 via INTRAVENOUS

## 2011-09-01 MED ORDER — OXYCODONE-ACETAMINOPHEN 5-325 MG PO TABS
1.0000 | ORAL_TABLET | Freq: Once | ORAL | Status: AC
  Administered 2011-09-01: 1 via ORAL

## 2011-09-01 MED ORDER — OXYCODONE-ACETAMINOPHEN 5-325 MG PO TABS: 1.0000 | ORAL_TABLET | ORAL | Status: AC | PRN

## 2011-09-01 MED ORDER — PHENYLEPHRINE HCL 10 MG/ML IJ SOLN
INTRAMUSCULAR | Status: DC | PRN
  Administered 2011-09-01 (×2): 40 ug via INTRAVENOUS

## 2011-09-01 MED ORDER — ONDANSETRON HCL 4 MG/2ML IJ SOLN
INTRAMUSCULAR | Status: DC | PRN
  Administered 2011-09-01: 4 mg via INTRAVENOUS

## 2011-09-01 MED ORDER — ONDANSETRON HCL 4 MG/2ML IJ SOLN: 4.0000 mg | Freq: Once | INTRAMUSCULAR | Status: DC | PRN

## 2011-09-01 MED ORDER — HYDROMORPHONE HCL PF 1 MG/ML IJ SOLN
0.2500 mg | INTRAMUSCULAR | Status: DC | PRN
  Administered 2011-09-01 (×2): 0.5 mg via INTRAVENOUS

## 2011-09-01 MED ORDER — GLYCOPYRROLATE 0.2 MG/ML IJ SOLN
INTRAMUSCULAR | Status: DC | PRN
  Administered 2011-09-01: 0.2 mg via INTRAVENOUS
  Administered 2011-09-01: 0.4 mg via INTRAVENOUS

## 2011-09-01 MED ORDER — OXYCODONE-ACETAMINOPHEN 5-325 MG PO TABS
ORAL_TABLET | ORAL | Status: AC
  Administered 2011-09-01: 1 via ORAL
  Filled 2011-09-01: qty 1

## 2011-09-01 MED ORDER — ACETAMINOPHEN 10 MG/ML IV SOLN
INTRAVENOUS | Status: AC
  Filled 2011-09-01: qty 100

## 2011-09-01 MED ORDER — CEFAZOLIN SODIUM 1-5 GM-% IV SOLN
INTRAVENOUS | Status: DC | PRN
  Administered 2011-09-01: 1 g via INTRAVENOUS

## 2011-09-01 MED ORDER — ACETAMINOPHEN 10 MG/ML IV SOLN
INTRAVENOUS | Status: DC | PRN
  Administered 2011-09-01: 1000 mg via INTRAVENOUS

## 2011-09-01 MED ORDER — OXYMETAZOLINE HCL 0.05 % NA SOLN
NASAL | Status: AC
  Filled 2011-09-01: qty 15

## 2011-09-01 MED ORDER — SODIUM CHLORIDE 0.9 % IR SOLN
Status: DC | PRN
  Administered 2011-09-01: 1

## 2011-09-01 MED ORDER — NEOSTIGMINE METHYLSULFATE 1 MG/ML IJ SOLN
INTRAMUSCULAR | Status: DC | PRN
  Administered 2011-09-01 (×2): 2 mg via INTRAVENOUS

## 2011-09-01 MED ORDER — LIDOCAINE-EPINEPHRINE 2 %-1:100000 IJ SOLN
INTRAMUSCULAR | Status: DC | PRN
  Administered 2011-09-01: 18 mL

## 2011-09-01 MED ORDER — DEXAMETHASONE SODIUM PHOSPHATE 4 MG/ML IJ SOLN
INTRAMUSCULAR | Status: DC | PRN
  Administered 2011-09-01: 4 mg via INTRAVENOUS

## 2011-09-01 MED ORDER — LIDOCAINE HCL (CARDIAC) 20 MG/ML IV SOLN
INTRAVENOUS | Status: DC | PRN
  Administered 2011-09-01: 100 mg via INTRAVENOUS

## 2011-09-01 MED ORDER — HYDROMORPHONE HCL PF 1 MG/ML IJ SOLN
INTRAMUSCULAR | Status: AC
  Filled 2011-09-01: qty 1

## 2011-09-01 MED ORDER — FENTANYL CITRATE 0.05 MG/ML IJ SOLN
INTRAMUSCULAR | Status: DC | PRN
  Administered 2011-09-01: 150 ug via INTRAVENOUS
  Administered 2011-09-01 (×2): 50 ug via INTRAVENOUS

## 2011-09-01 MED ORDER — ROCURONIUM BROMIDE 100 MG/10ML IV SOLN
INTRAVENOUS | Status: DC | PRN
  Administered 2011-09-01: 50 mg via INTRAVENOUS

## 2011-09-01 MED ORDER — MIDAZOLAM HCL 5 MG/5ML IJ SOLN
INTRAMUSCULAR | Status: DC | PRN
  Administered 2011-09-01: 2 mg via INTRAVENOUS

## 2011-09-01 MED ORDER — CEFAZOLIN SODIUM 1-5 GM-% IV SOLN
INTRAVENOUS | Status: AC
  Filled 2011-09-01: qty 50

## 2011-09-01 SURGICAL SUPPLY — 29 items
BUR CROSS CUT FISSURE 1.6 (BURR) ×2 IMPLANT
BUR EGG ELITE 4.0 (BURR) ×2 IMPLANT
CANISTER SUCTION 2500CC (MISCELLANEOUS) ×2 IMPLANT
CLOTH BEACON ORANGE TIMEOUT ST (SAFETY) ×2 IMPLANT
COVER SURGICAL LIGHT HANDLE (MISCELLANEOUS) ×2 IMPLANT
CRADLE DONUT ADULT HEAD (MISCELLANEOUS) ×2 IMPLANT
DECANTER SPIKE VIAL GLASS SM (MISCELLANEOUS) ×2 IMPLANT
GAUZE PACKING FOLDED 2  STR (GAUZE/BANDAGES/DRESSINGS) ×1
GAUZE PACKING FOLDED 2 STR (GAUZE/BANDAGES/DRESSINGS) ×1 IMPLANT
GLOVE BIO SURGEON STRL SZ 6.5 (GLOVE) ×2 IMPLANT
GLOVE BIO SURGEON STRL SZ7 (GLOVE) ×1 IMPLANT
GLOVE BIO SURGEON STRL SZ7.5 (GLOVE) ×2 IMPLANT
GLOVE BIOGEL PI IND STRL 7.0 (GLOVE) ×1 IMPLANT
GLOVE BIOGEL PI INDICATOR 7.0 (GLOVE) ×2
GOWN STRL NON-REIN LRG LVL3 (GOWN DISPOSABLE) ×4 IMPLANT
GOWN STRL REIN XL XLG (GOWN DISPOSABLE) ×2 IMPLANT
KIT BASIN OR (CUSTOM PROCEDURE TRAY) ×2 IMPLANT
KIT ROOM TURNOVER OR (KITS) ×2 IMPLANT
NEEDLE 22X1 1/2 (OR ONLY) (NEEDLE) ×2 IMPLANT
NS IRRIG 1000ML POUR BTL (IV SOLUTION) ×2 IMPLANT
PAD ARMBOARD 7.5X6 YLW CONV (MISCELLANEOUS) ×3 IMPLANT
SUT CHROMIC 3 0 PS 2 (SUTURE) ×2 IMPLANT
SYR 50ML SLIP (SYRINGE) IMPLANT
SYR CONTROL 10ML LL (SYRINGE) ×2 IMPLANT
TOWEL OR 17X26 10 PK STRL BLUE (TOWEL DISPOSABLE) ×2 IMPLANT
TRAY ENT MC OR (CUSTOM PROCEDURE TRAY) ×2 IMPLANT
TUBING IRRIGATION (MISCELLANEOUS) ×1 IMPLANT
WATER STERILE IRR 1000ML POUR (IV SOLUTION) IMPLANT
YANKAUER SUCT BULB TIP NO VENT (SUCTIONS) ×2 IMPLANT

## 2011-09-01 NOTE — Op Note (Signed)
09/01/2011  11:00 AM  PATIENT:  [name]  41 y.o. female  PRE-OPERATIVE DIAGNOSIS:  Non-Restorable Teeth #'s 3, 4, 5, 8, 9, 10, 11, 12, 13, 14, 20, 30  POST-OPERATIVE DIAGNOSIS:  SAME  PROCEDURE:  Procedure(s): MULTIPLE EXTRACION Non-Restorable Teeth #'s 3, 4, 5, 8, 9, 10, 11, 12, 13, 14, 20, 30 WITH ALVEOLOPLASTY  SURGEON:  Surgeon(s): Georgia Lopes, DDS  ANESTHESIA:   local and general  EBL:  minimal  DRAINS: none   SPECIMEN:  No Specimen  COUNTS:  YES  PLAN OF CARE: Discharge to home after PACU  PATIENT DISPOSITION:  PACU - hemodynamically stable.   PROCEDURE DETAILS: Dictation # [id number]  Georgia Lopes, DMD 09/01/2011 11:00 AM

## 2011-09-01 NOTE — Progress Notes (Signed)
Report to Stephanie RN as caregiver 

## 2011-09-01 NOTE — H&P (Signed)
H&P documentation  -History and Physical Reviewed  -Patient has been re-examined  -No change in the plan of care  [name]  

## 2011-09-01 NOTE — Transfer of Care (Signed)
Immediate Anesthesia Transfer of Care Note  Patient: [name]  Procedure(s) Performed: Procedure(s) (LRB): MULTIPLE EXTRACION WITH ALVEOLOPLASTY (N/A)  Patient Location: PACU  Anesthesia Type: General  Level of Consciousness: awake, oriented and patient cooperative  Airway & Oxygen Therapy: Patient Spontanous Breathing and Patient connected to nasal cannula oxygen  Post-op Assessment: Report given to PACU RN, Post -op Vital signs reviewed and stable and Patient moving all extremities X 4  Post vital signs: Reviewed and stable  Complications: No apparent anesthesia complications

## 2011-09-01 NOTE — Anesthesia Postprocedure Evaluation (Signed)
Anesthesia Post Note  Patient: [name]  Procedure(s) Performed: Procedure(s) (LRB): MULTIPLE EXTRACION WITH ALVEOLOPLASTY (N/A)  Anesthesia type: General  Patient location: PACU  Post pain: Pain level controlled  Post assessment: Patient's Cardiovascular Status Stable  Last Vitals:  Filed Vitals:   09/01/11 1130  BP: 96/52  Pulse: 91  Temp:   Resp: 16    Post vital signs: Reviewed and stable  Level of consciousness: alert  Complications: No apparent anesthesia complications

## 2011-09-01 NOTE — Preoperative (Signed)
Beta Blockers   Reason not to administer Beta Blockers:Not Applicable 

## 2011-09-01 NOTE — Anesthesia Preprocedure Evaluation (Signed)
Anesthesia Evaluation  Patient identified by MRN, date of birth, ID band Patient awake    Reviewed: Allergy & Precautions, H&P , NPO status , Patient's Chart, lab work & pertinent test results  Airway Mallampati: I TM Distance: >3 FB Neck ROM: Full    Dental   Pulmonary          Cardiovascular + dysrhythmias + Valvular Problems/Murmurs     Neuro/Psych    GI/Hepatic   Endo/Other    Renal/GU      Musculoskeletal   Abdominal   Peds  Hematology   Anesthesia Other Findings   Reproductive/Obstetrics                           Anesthesia Physical Anesthesia Plan  ASA: III  Anesthesia Plan: General   Post-op Pain Management:    Induction: Intravenous  Airway Management Planned: Oral ETT  Additional Equipment:   Intra-op Plan:   Post-operative Plan: Extubation in OR  Informed Consent: I have reviewed the patients History and Physical, chart, labs and discussed the procedure including the risks, benefits and alternatives for the proposed anesthesia with the patient or authorized representative who has indicated his/her understanding and acceptance.     Plan Discussed with: CRNA and Surgeon  Anesthesia Plan Comments:         Anesthesia Quick Evaluation

## 2011-09-02 ENCOUNTER — Encounter (HOSPITAL_COMMUNITY): Payer: Self-pay | Admitting: Oral Surgery

## 2011-09-02 NOTE — Op Note (Signed)
[name], [name]                  ACCOUNT NO.:  [id number]  MEDICAL RECORD NO.:  [id number]  LOCATION:  [facility]                         FACILITY:  [facility]  PHYSICIAN:  Georgia Lopes, M.D.  DATE OF BIRTH:  May 29, 1970  DATE OF PROCEDURE:  09/01/2011 DATE OF DISCHARGE:  09/01/2011                              OPERATIVE REPORT   PREOPERATIVE DIAGNOSIS:  Nonrestorable teeth #3, #4, #5, #8, #9, #10, #11, #12, #13, #14, #20, and #30.  POSTOPERATIVE DIAGNOSIS:  Nonrestorable teeth #3, #4, #5, #8, #9, #10, #11, #12, #13, #14, #20, and #30.  PROCEDURE:  Removal of teeth #3, #4, #5, #8, #9, #10, #11, #12, #13, #14, #20, and #30. alveoplasty right and left maxilla.  SURGEON:  Georgia Lopes, MD  ANESTHESIA:  General, Dr. Michelle Piper, attending.  INDICATIONS FOR PROCEDURE:  [name] is a 41 year old female who is referred to my office by her general dentist for removal of multiple nonrestorable teeth.  She has past medical history significant for mitral valve replacement, hep C, hypothyroid, depression, and smokes a pack a day.  Because of her medical history, it is recommended the patient undergo general anesthesia with intubation for airway protection.  PROCEDURE IN DETAIL:  The patient was taken to the operating room, placed on the table in supine position.  General anesthesia was administered intravenously and an oral endotracheal tube was placed and marked.  The eyes were protected.  The patient was draped for the procedure.  Time-out was performed.  Then posterior pharynx was suctioned with Yankauer suction and a throat pack was placed.  A 2% lidocaine with 1:100,000 epinephrine was infiltrated in an inferior alveolar block on the right and left side and the buccal and palatal infiltration in the maxilla.  A bite block was placed in the right side of the mouth and a Sweetheart retractor was used to retract the tongue. A 15 blade was used to make a full-thickness incision, buckling  and lingually around tooth #20 in the mandible and around teeth #8, #9, #10, #11, #12, #13, #14, buckling palatally in the maxilla and then the periosteal elevator was used to reflect the periosteum.  Interproximal bone was removed from around the posterior teeth.  Then, the teeth were elevated with 301 elevator and removed from the mouth with the lower Ash forceps and the upper universal forceps.  Tooth #14 and #12 fractured upon removal and additional bone was removed with the handpiece under irrigation and the roots were removed with the 301 elevator.  The sockets were then curetted and then in the maxilla, the periosteum was reflected buccally to expose the alveolar bone.  The egg-shaped bur under irrigation was then used to perform an alveoplasty and the area was further smoothed with a bone file and then irrigated and closed with 3-0 chromic.  The Sweetheart retractor and the bite block were repositioned to the other side of the mouth and a 15 blade was used to make a full-thickness incision around tooth #30, buckling lingually and around teeth #3, #4, and #5, buckling and palatally, periosteal elevator was used to reflect the periosteum and the proximal bone was removed with a Striker handpiece under  irrigation.  The teeth were elevated with a 301 elevator and removed from the mouth with the dental forceps.  The sockets were then curetted and the maxillary incision was carried anteriorly to join to the previous exit incision which ended at tooth #8.  The periosteum was reflected buccally and then alveoplasty was performed using egg-shaped bur and the bone file.  The areas were then irrigated and closed with 3-0 chromic.  The oral cavity was inspected, found to have good contour hemostasis and closure.  The oral cavity was irrigated and suctioned.  Throat pack was removed.  The patient was awakened, taken to the recovery room, breathing spontaneously in good condition.  ESTIMATED  BLOOD LOSS:  Minimum.  COMPLICATIONS:  None.  SPECIMENS:  None.     Georgia Lopes, M.D.     SMJ/MEDQ  D:  09/01/2011  T:  09/02/2011  Job:  [id number]

## 2011-09-04 ENCOUNTER — Encounter (HOSPITAL_COMMUNITY): Payer: Self-pay

## 2012-02-23 ENCOUNTER — Emergency Department: Payer: Self-pay | Admitting: Emergency Medicine

## 2012-02-23 LAB — URINALYSIS, COMPLETE
Blood: NEGATIVE
Glucose,UR: NEGATIVE mg/dL (ref 0–75)
Ketone: NEGATIVE
Nitrite: NEGATIVE
Ph: 6 (ref 4.5–8.0)
Protein: NEGATIVE
Specific Gravity: 1.01 (ref 1.003–1.030)
WBC UR: 8 /HPF (ref 0–5)

## 2012-02-23 LAB — ETHANOL: Ethanol %: <0.003 % (ref 0.000–0.080)

## 2012-02-23 LAB — DRUG SCREEN, URINE
Amphetamines, Ur Screen: NEGATIVE (ref ?–1000)
Barbiturates, Ur Screen: NEGATIVE (ref ?–200)
Benzodiazepine, Ur Scrn: POSITIVE (ref ?–200)
Cocaine Metabolite,Ur ~~LOC~~: NEGATIVE (ref ?–300)
Methadone, Ur Screen: NEGATIVE (ref ?–300)
Opiate, Ur Screen: NEGATIVE (ref ?–300)
Phencyclidine (PCP) Ur S: NEGATIVE (ref ?–25)
Tricyclic, Ur Screen: NEGATIVE (ref ?–1000)

## 2012-02-23 LAB — CBC
HGB: 13.5 g/dL (ref 12.0–16.0)
MCH: 30.8 pg (ref 26.0–34.0)
MCHC: 34.1 g/dL (ref 32.0–36.0)
RDW: 13.7 % (ref 11.5–14.5)
WBC: 13.1 10*3/uL — ABNORMAL HIGH (ref 3.6–11.0)

## 2012-02-23 LAB — COMPREHENSIVE METABOLIC PANEL
Anion Gap: 9 (ref 7–16)
Bilirubin,Total: 1.4 mg/dL — ABNORMAL HIGH (ref 0.2–1.0)
Calcium, Total: 8.4 mg/dL — ABNORMAL LOW (ref 8.5–10.1)
Chloride: 105 mmol/L (ref 98–107)
Creatinine: 0.89 mg/dL (ref 0.60–1.30)
EGFR (African American): >60
EGFR (Non-African Amer.): >60
Glucose: 99 mg/dL (ref 65–99)
Osmolality: 271 (ref 275–301)
SGPT (ALT): 27 U/L (ref 12–78)

## 2012-02-23 LAB — RAPID INFLUENZA A&B ANTIGENS

## 2012-02-23 LAB — LIPASE, BLOOD: Lipase: 65 U/L — ABNORMAL LOW (ref 73–393)

## 2012-02-23 LAB — PREGNANCY, URINE: Pregnancy Test, Urine: NEGATIVE m[IU]/mL

## 2012-02-29 LAB — CULTURE, BLOOD (SINGLE)

## 2012-09-20 ENCOUNTER — Encounter (HOSPITAL_COMMUNITY): Payer: Self-pay | Admitting: Neurology

## 2012-09-20 ENCOUNTER — Emergency Department (HOSPITAL_COMMUNITY): Payer: Medicaid Other

## 2012-09-20 ENCOUNTER — Emergency Department (HOSPITAL_COMMUNITY)
Admission: EM | Admit: 2012-09-20 | Discharge: 2012-09-20 | Disposition: A | Discharge status: Home / Self Care | Payer: Medicaid Other | Attending: Emergency Medicine | Admitting: Emergency Medicine

## 2012-09-20 DIAGNOSIS — S93409A Sprain of unspecified ligament of unspecified ankle, initial encounter: Secondary | ICD-10-CM | POA: Diagnosis not present

## 2012-09-20 DIAGNOSIS — F411 Generalized anxiety disorder: Secondary | ICD-10-CM | POA: Insufficient documentation

## 2012-09-20 DIAGNOSIS — Z8669 Personal history of other diseases of the nervous system and sense organs: Secondary | ICD-10-CM | POA: Insufficient documentation

## 2012-09-20 DIAGNOSIS — F489 Nonpsychotic mental disorder, unspecified: Secondary | ICD-10-CM | POA: Insufficient documentation

## 2012-09-20 DIAGNOSIS — Z79899 Other long term (current) drug therapy: Secondary | ICD-10-CM | POA: Insufficient documentation

## 2012-09-20 DIAGNOSIS — Z7982 Long term (current) use of aspirin: Secondary | ICD-10-CM | POA: Insufficient documentation

## 2012-09-20 DIAGNOSIS — E039 Hypothyroidism, unspecified: Secondary | ICD-10-CM | POA: Insufficient documentation

## 2012-09-20 DIAGNOSIS — M25579 Pain in unspecified ankle and joints of unspecified foot: Secondary | ICD-10-CM | POA: Diagnosis present

## 2012-09-20 DIAGNOSIS — D649 Anemia, unspecified: Secondary | ICD-10-CM | POA: Insufficient documentation

## 2012-09-20 DIAGNOSIS — Y929 Unspecified place or not applicable: Secondary | ICD-10-CM | POA: Insufficient documentation

## 2012-09-20 DIAGNOSIS — Y9301 Activity, walking, marching and hiking: Secondary | ICD-10-CM | POA: Insufficient documentation

## 2012-09-20 DIAGNOSIS — F17200 Nicotine dependence, unspecified, uncomplicated: Secondary | ICD-10-CM | POA: Diagnosis not present

## 2012-09-20 DIAGNOSIS — M1990 Unspecified osteoarthritis, unspecified site: Secondary | ICD-10-CM | POA: Diagnosis not present

## 2012-09-20 DIAGNOSIS — Z8673 Personal history of transient ischemic attack (TIA), and cerebral infarction without residual deficits: Secondary | ICD-10-CM | POA: Insufficient documentation

## 2012-09-20 DIAGNOSIS — B1920 Unspecified viral hepatitis C without hepatic coma: Secondary | ICD-10-CM | POA: Insufficient documentation

## 2012-09-20 DIAGNOSIS — S93401A Sprain of unspecified ligament of right ankle, initial encounter: Secondary | ICD-10-CM

## 2012-09-20 DIAGNOSIS — Z8679 Personal history of other diseases of the circulatory system: Secondary | ICD-10-CM | POA: Insufficient documentation

## 2012-09-20 DIAGNOSIS — X500XXA Overexertion from strenuous movement or load, initial encounter: Secondary | ICD-10-CM | POA: Insufficient documentation

## 2012-09-20 MED ORDER — HYDROCODONE-ACETAMINOPHEN 5-325 MG PO TABS
2.0000 | ORAL_TABLET | Freq: Once | ORAL | Status: AC
  Administered 2012-09-20: 2 via ORAL
  Filled 2012-09-20: qty 2

## 2012-09-20 MED ORDER — HYDROCODONE-ACETAMINOPHEN 5-325 MG PO TABS: 2.0000 | ORAL_TABLET | ORAL | Status: DC | PRN

## 2012-09-20 MED ORDER — ONDANSETRON 4 MG PO TBDP
4.0000 mg | ORAL_TABLET | Freq: Once | ORAL | Status: AC
  Administered 2012-09-20: 4 mg via ORAL
  Filled 2012-09-20: qty 1

## 2012-09-20 NOTE — Progress Notes (Signed)
Orthopedic Tech Progress Note Patient Details:  KENT BRAUNSCHWEIG [birth year] [id number] ASO applied to Right LE. Tolerated well.  Ortho Devices Type of Ortho Device: Crutches;ASO Ortho Device/Splint Location: Right LE Ortho Device/Splint Interventions: Application   Asia R Thompson 09/20/2012, 9:26 AM

## 2012-09-20 NOTE — ED Provider Notes (Signed)
CSN: [id number]     Arrival date & time 09/20/12  8119 History   First MD Initiated Contact with Patient 09/20/12 0800     Chief Complaint  Patient presents with  . Ankle Pain   (Consider location/radiation/quality/duration/timing/severity/associated sxs/prior Treatment) Patient is a 42 y.o. female presenting with ankle pain. The history is provided by the patient. No language interpreter was used.  Ankle Pain Location:  Ankle Injury: yes   Ankle location:  R ankle Pain details:    Quality:  Aching   Radiates to:  Does not radiate   Severity:  Moderate   Onset quality:  Sudden   Timing:  Constant   Progression:  Worsening Chronicity:  New Dislocation: no   Foreign body present:  No foreign bodies Pt complains of pain in ankle.  Pt reports she thinks ankle turned when she was walking.  Past Medical History  Diagnosis Date  . Bacterial endocarditis     s/p MVR and TVR at [facility] 06/2010; normal cors by cath 06/2010 at [facility]  . DJD (degenerative joint disease)   . Hepatitis C     history of  . Anemia   . Hypothyroidism   . Stroke   . Hepatitis C   . Anxiety   . Mental disorder   . JYNWGNFA(213.0)    Past Surgical History  Procedure Laterality Date  . Cholecystectomy    . Tonsillectomy and adenoidectomy    . Valve replacement    . Appendectomy    . Coronary artery bypass graft      [place]    [facility] Je[facility]  [facility]  . Cardiac catheterization    . Cataract extraction    . Multiple extractions with alveoloplasty  09/01/2011    Procedure: MULTIPLE EXTRACION WITH ALVEOLOPLASTY;  Surgeon: Georgia Lopes, DDS;  Location: [facility] OR;  Service: Oral Surgery;  Laterality: N/A;   Family History  Problem Relation Age of Onset  . Cancer    . Coronary artery disease     History  Substance Use Topics  . Smoking status: Current Every Day Smoker -- 1.00 packs/day for 30 years    Types: Cigarettes  . Smokeless tobacco: Not on file     Comment: somkes about 1/2 pack a day   . Alcohol Use: No   OB History   Grav Para Term Preterm Abortions TAB SAB Ect Mult Living                 Review of Systems  Musculoskeletal: Positive for myalgias and joint swelling.  All other systems reviewed and are negative.    Allergies  Review of patient's allergies indicates no known allergies.  Home Medications   Current Outpatient Rx  Name  Route  Sig  Dispense  Refill  . aspirin 81 MG tablet   Oral   Take 81 mg by mouth daily.           . Aspirin-Acetaminophen-Caffeine (GOODY HEADACHE PO)   Oral   Take 1 packet by mouth as needed (headach).         . gabapentin (NEURONTIN) 600 MG tablet   Oral   Take 600 mg by mouth daily.         . Oxycodone-Acetaminophen (PERCOCET PO)   Oral   Take 1 tablet by mouth as needed (pain).         . prednisoLONE acetate (PRED FORTE) 1 % ophthalmic suspension  Left Eye   Place 1 drop into the left eye as needed (allergies).          . furosemide (LASIX) 20 MG tablet   Oral   Take 20 mg by mouth daily as needed. Swelling         . levothyroxine (SYNTHROID, LEVOTHROID) 50 MCG tablet   Oral   Take 50 mcg by mouth daily.         . nortriptyline (PAMELOR) 25 MG capsule   Oral   Take 25 mg by mouth at bedtime. Up to 6 per day for depression/pain         . Potassium 99 MG TABS   Oral   Take 1 tablet (99 mg total) by mouth daily as needed. TAKE WHEN YOU TAKE YOUR LASIX         . rizatriptan (MAXALT-MLT) 5 MG disintegrating tablet   Oral   Take 5 mg by mouth See admin instructions. Take 1 tablet as needed. Max 2 times a week.         . topiramate (TOPAMAX) 25 MG tablet   Oral   Take 50 mg by mouth 2 (two) times daily.          BP 104/68  Pulse 82  Temp(Src) 98.3 F (36.8 C) (Oral)  Resp 16  Ht 5\' 4"  (1.626 m)  Wt 165 lb (74.844 kg)  BMI 28.31 kg/m2  SpO2 100%  LMP 09/20/2012 Physical Exam  Nursing note and vitals reviewed. Constitutional: She is oriented to person, place, and time. She  appears well-developed and well-nourished.  Musculoskeletal: She exhibits tenderness.  Swollen tender right ankle  Neurological: She is alert and oriented to person, place, and time.  Skin: Skin is warm.  Psychiatric: She has a normal mood and affect.    ED Course  Procedures (including critical care time) Labs Review Labs Reviewed - No data to display Imaging Review Dg Ankle Complete Right  09/20/2012   *RADIOLOGY REPORT*  Clinical Data: Pain, swelling, redness at the anterior and lateral aspects of the ankle.  No known injury.  RIGHT ANKLE - COMPLETE 3+ VIEW  Comparison: None.  Findings: The osseous structures of the ankle are normal.  No appreciable arthritis.  There is soft tissue swelling around the ankle.  Small ankle effusion.  IMPRESSION: No osseous abnormality.  Soft tissue swelling and ankle effusion.   Original Report Authenticated By: Francene Boyers, M.D.    MDM   1. Ankle sprain, right, initial encounter    No fracture.   Pt advised to follow up with her MD for recheck in 3-4 days.  Pt placed in an so and on crutches.   Pt advised to follow up.   Rx for hydrocodone    Elson Areas, PA-C 09/20/12 0911  Elson Areas, PA-C 09/20/12 [phone]

## 2012-09-20 NOTE — ED Notes (Signed)
Pt reporting right ankle pain since yesterday unsure if she injured it. Using cane to ambulate.

## 2012-09-22 NOTE — ED Provider Notes (Signed)
Medical screening examination/treatment/procedure(s) were performed by non-physician practitioner and as supervising physician I was immediately available for consultation/collaboration.  Juliet Rude. Rubin Payor, MD 09/22/12 [phone]

## 2012-09-24 DIAGNOSIS — I38 Endocarditis, valve unspecified: Secondary | ICD-10-CM

## 2012-09-24 HISTORY — DX: Endocarditis, valve unspecified: I38

## 2012-09-25 ENCOUNTER — Inpatient Hospital Stay (HOSPITAL_COMMUNITY)
Admission: EM | Admit: 2012-09-25 | Discharge: 2012-09-26 | DRG: 314 | Disposition: A | Discharge status: Other Acute Inpatient Hospital | Payer: Medicaid Other | Attending: Family Medicine | Admitting: Family Medicine

## 2012-09-25 ENCOUNTER — Emergency Department (HOSPITAL_COMMUNITY): Payer: Medicaid Other

## 2012-09-25 ENCOUNTER — Encounter (HOSPITAL_COMMUNITY): Payer: Self-pay | Admitting: Emergency Medicine

## 2012-09-25 ENCOUNTER — Inpatient Hospital Stay (HOSPITAL_COMMUNITY): Payer: Medicaid Other

## 2012-09-25 DIAGNOSIS — Z8673 Personal history of transient ischemic attack (TIA), and cerebral infarction without residual deficits: Secondary | ICD-10-CM

## 2012-09-25 DIAGNOSIS — T827XXA Infection and inflammatory reaction due to other cardiac and vascular devices, implants and grafts, initial encounter: Principal | ICD-10-CM | POA: Diagnosis present

## 2012-09-25 DIAGNOSIS — I509 Heart failure, unspecified: Secondary | ICD-10-CM | POA: Diagnosis present

## 2012-09-25 DIAGNOSIS — I340 Nonrheumatic mitral (valve) insufficiency: Secondary | ICD-10-CM

## 2012-09-25 DIAGNOSIS — I5031 Acute diastolic (congestive) heart failure: Secondary | ICD-10-CM

## 2012-09-25 DIAGNOSIS — F17200 Nicotine dependence, unspecified, uncomplicated: Secondary | ICD-10-CM | POA: Diagnosis present

## 2012-09-25 DIAGNOSIS — I330 Acute and subacute infective endocarditis: Secondary | ICD-10-CM

## 2012-09-25 DIAGNOSIS — I2510 Atherosclerotic heart disease of native coronary artery without angina pectoris: Secondary | ICD-10-CM | POA: Diagnosis present

## 2012-09-25 DIAGNOSIS — R0602 Shortness of breath: Secondary | ICD-10-CM | POA: Diagnosis not present

## 2012-09-25 DIAGNOSIS — Z951 Presence of aortocoronary bypass graft: Secondary | ICD-10-CM

## 2012-09-25 DIAGNOSIS — I4892 Unspecified atrial flutter: Secondary | ICD-10-CM | POA: Diagnosis present

## 2012-09-25 DIAGNOSIS — I071 Rheumatic tricuspid insufficiency: Secondary | ICD-10-CM

## 2012-09-25 DIAGNOSIS — T826XXA Infection and inflammatory reaction due to cardiac valve prosthesis, initial encounter: Secondary | ICD-10-CM

## 2012-09-25 DIAGNOSIS — F411 Generalized anxiety disorder: Secondary | ICD-10-CM | POA: Diagnosis present

## 2012-09-25 DIAGNOSIS — R0902 Hypoxemia: Secondary | ICD-10-CM

## 2012-09-25 DIAGNOSIS — I5033 Acute on chronic diastolic (congestive) heart failure: Secondary | ICD-10-CM | POA: Diagnosis present

## 2012-09-25 DIAGNOSIS — E039 Hypothyroidism, unspecified: Secondary | ICD-10-CM

## 2012-09-25 DIAGNOSIS — B1920 Unspecified viral hepatitis C without hepatic coma: Secondary | ICD-10-CM | POA: Diagnosis present

## 2012-09-25 DIAGNOSIS — Z7982 Long term (current) use of aspirin: Secondary | ICD-10-CM

## 2012-09-25 DIAGNOSIS — J9589 Other postprocedural complications and disorders of respiratory system, not elsewhere classified: Secondary | ICD-10-CM

## 2012-09-25 DIAGNOSIS — J80 Acute respiratory distress syndrome: Secondary | ICD-10-CM

## 2012-09-25 DIAGNOSIS — Z79899 Other long term (current) drug therapy: Secondary | ICD-10-CM

## 2012-09-25 DIAGNOSIS — Y831 Surgical operation with implant of artificial internal device as the cause of abnormal reaction of the patient, or of later complication, without mention of misadventure at the time of the procedure: Secondary | ICD-10-CM | POA: Diagnosis present

## 2012-09-25 LAB — RAPID URINE DRUG SCREEN, HOSP PERFORMED
Barbiturates: NOT DETECTED
Cocaine: NOT DETECTED

## 2012-09-25 LAB — COMPREHENSIVE METABOLIC PANEL
Alkaline Phosphatase: 108 U/L (ref 39–117)
BUN: 13 mg/dL (ref 6–23)
Creatinine, Ser: 0.85 mg/dL (ref 0.50–1.10)
GFR calc Af Amer: >90 mL/min (ref >90)
Glucose, Bld: 131 mg/dL — ABNORMAL HIGH (ref 70–99)
Potassium: 4.3 mEq/L (ref 3.5–5.1)
Total Protein: 7.4 g/dL (ref 6.0–8.3)

## 2012-09-25 LAB — PRO B NATRIURETIC PEPTIDE: Pro B Natriuretic peptide (BNP): 6304 pg/mL — ABNORMAL HIGH (ref 0–125)

## 2012-09-25 LAB — CBC WITH DIFFERENTIAL/PLATELET
Eosinophils Absolute: 0 10*3/uL (ref 0.0–0.7)
Eosinophils Relative: 0 % (ref 0–5)
HCT: 44.7 % (ref 36.0–46.0)
Hemoglobin: 15.2 g/dL — ABNORMAL HIGH (ref 12.0–15.0)
Lymphs Abs: 0.6 10*3/uL — ABNORMAL LOW (ref 0.7–4.0)
MCH: 30.8 pg (ref 26.0–34.0)
MCV: 90.5 fL (ref 78.0–100.0)
Monocytes Absolute: 0.3 10*3/uL (ref 0.1–1.0)
Monocytes Relative: 2 % — ABNORMAL LOW (ref 3–12)
Neutrophils Relative %: 93 % — ABNORMAL HIGH (ref 43–77)
RBC: 4.94 MIL/uL (ref 3.87–5.11)

## 2012-09-25 LAB — POCT I-STAT TROPONIN I: Troponin i, poc: 0.49 ng/mL (ref 0.00–0.08)

## 2012-09-25 LAB — CBC
Hemoglobin: 14.5 g/dL (ref 12.0–15.0)
MCH: 30.1 pg (ref 26.0–34.0)
RBC: 4.82 MIL/uL (ref 3.87–5.11)
WBC: 11.9 10*3/uL — ABNORMAL HIGH (ref 4.0–10.5)

## 2012-09-25 LAB — TROPONIN I
Troponin I: 0.42 ng/mL (ref <0.30)
Troponin I: 0.45 ng/mL (ref <0.30)

## 2012-09-25 LAB — POCT I-STAT 3, ART BLOOD GAS (G3+)
O2 Saturation: 94 %
TCO2: 21 mmol/L (ref 0–100)
pCO2 arterial: 27.9 mmHg — ABNORMAL LOW (ref 35.0–45.0)
pO2, Arterial: 65 mmHg — ABNORMAL LOW (ref 80.0–100.0)

## 2012-09-25 LAB — CREATININE, SERUM: GFR calc non Af Amer: 74 mL/min — ABNORMAL LOW (ref >90)

## 2012-09-25 LAB — CK TOTAL AND CKMB (NOT AT ARMC)
Relative Index: INVALID (ref 0.0–2.5)
Total CK: 25 U/L (ref 7–177)

## 2012-09-25 MED ORDER — ASPIRIN 81 MG PO CHEW
324.0000 mg | CHEWABLE_TABLET | Freq: Once | ORAL | Status: AC
  Administered 2012-09-25: 324 mg via ORAL
  Filled 2012-09-25: qty 4

## 2012-09-25 MED ORDER — NITROGLYCERIN IN D5W 200-5 MCG/ML-% IV SOLN
5.0000 ug/min | INTRAVENOUS | Status: DC
  Administered 2012-09-25: 5 ug/min via INTRAVENOUS
  Filled 2012-09-25: qty 250

## 2012-09-25 MED ORDER — FUROSEMIDE 10 MG/ML IJ SOLN
40.0000 mg | Freq: Two times a day (BID) | INTRAMUSCULAR | Status: DC
  Administered 2012-09-26: 40 mg via INTRAVENOUS
  Filled 2012-09-25 (×3): qty 4

## 2012-09-25 MED ORDER — DEXTROSE 5 % IV SOLN
500.0000 mg | Freq: Once | INTRAVENOUS | Status: AC
  Administered 2012-09-25: 500 mg via INTRAVENOUS
  Filled 2012-09-25: qty 500

## 2012-09-25 MED ORDER — DEXTROSE 5 % IV SOLN: 1.0000 g | Freq: Once | INTRAVENOUS | Status: AC

## 2012-09-25 MED ORDER — ONDANSETRON HCL 4 MG/2ML IJ SOLN: 4.0000 mg | Freq: Once | INTRAMUSCULAR | Status: AC

## 2012-09-25 MED ORDER — LORAZEPAM 0.5 MG PO TABS
0.5000 mg | ORAL_TABLET | ORAL | Status: DC | PRN
  Administered 2012-09-25 – 2012-09-26 (×3): 0.5 mg via ORAL
  Filled 2012-09-25 (×3): qty 1

## 2012-09-25 MED ORDER — GABAPENTIN 600 MG PO TABS
600.0000 mg | ORAL_TABLET | Freq: Every day | ORAL | Status: DC
  Administered 2012-09-25 – 2012-09-26 (×2): 600 mg via ORAL
  Filled 2012-09-25 (×2): qty 1

## 2012-09-25 MED ORDER — ASPIRIN 81 MG PO CHEW: 324.0000 mg | CHEWABLE_TABLET | Freq: Once | ORAL | Status: AC

## 2012-09-25 MED ORDER — SODIUM CHLORIDE 0.9 % IJ SOLN
3.0000 mL | Freq: Two times a day (BID) | INTRAMUSCULAR | Status: DC
  Administered 2012-09-25 – 2012-09-26 (×2): 3 mL via INTRAVENOUS

## 2012-09-25 MED ORDER — FUROSEMIDE 10 MG/ML IJ SOLN
40.0000 mg | Freq: Once | INTRAMUSCULAR | Status: AC
  Administered 2012-09-25: 40 mg via INTRAVENOUS
  Filled 2012-09-25: qty 4

## 2012-09-25 MED ORDER — HEPARIN SODIUM (PORCINE) 5000 UNIT/ML IJ SOLN
5000.0000 [IU] | Freq: Three times a day (TID) | INTRAMUSCULAR | Status: DC
  Administered 2012-09-25 – 2012-09-26 (×4): 5000 [IU] via SUBCUTANEOUS
  Filled 2012-09-25 (×5): qty 1

## 2012-09-25 MED ORDER — ISOSORBIDE DINITRATE 20 MG PO TABS
20.0000 mg | ORAL_TABLET | Freq: Two times a day (BID) | ORAL | Status: DC
  Administered 2012-09-25 – 2012-09-26 (×2): 20 mg via ORAL
  Filled 2012-09-25 (×3): qty 1

## 2012-09-25 MED ORDER — ONDANSETRON HCL 4 MG/2ML IJ SOLN: 4.0000 mg | Freq: Three times a day (TID) | INTRAMUSCULAR | Status: AC | PRN

## 2012-09-25 MED ORDER — ONDANSETRON HCL 4 MG/2ML IJ SOLN
4.0000 mg | Freq: Once | INTRAMUSCULAR | Status: AC
  Administered 2012-09-25: 4 mg via INTRAVENOUS
  Filled 2012-09-25: qty 2

## 2012-09-25 MED ORDER — DEXTROSE 5 % IV SOLN
1.0000 g | Freq: Once | INTRAVENOUS | Status: AC
  Administered 2012-09-25: 1 g via INTRAVENOUS
  Filled 2012-09-25: qty 10

## 2012-09-25 MED ORDER — HEPARIN SODIUM (PORCINE) 5000 UNIT/ML IJ SOLN
5000.0000 [IU] | Freq: Two times a day (BID) | INTRAMUSCULAR | Status: DC
  Filled 2012-09-25: qty 1

## 2012-09-25 MED ORDER — FUROSEMIDE 10 MG/ML IJ SOLN: 40.0000 mg | Freq: Two times a day (BID) | INTRAMUSCULAR | Status: DC

## 2012-09-25 NOTE — ED Notes (Signed)
Dr.Belfi shown results of Istat Tropin. Level Ed-Lab

## 2012-09-25 NOTE — ED Notes (Signed)
Report called to floor nurse Teena for pt.  RN requested an additional Troponin due to elevated levels.  Requested MD to be paged.

## 2012-09-25 NOTE — ED Notes (Signed)
Pt c/o burning at left hand IV site where antibiotic being infused. No obvious redness noted. Pt c/o itching at site. Moved antibiotic infusing from left hand to right AC. Flushed left hand line and saline locked. No swelling, redness, or streaking noted.

## 2012-09-25 NOTE — ED Notes (Signed)
Pt c/o SOB with blood colored sputum.  Pt with O2 of 91 on 2L [place].  No vomiting or nausea.  Denies chest pain.

## 2012-09-25 NOTE — ED Notes (Signed)
O2 unhooked, MD no longer needs blood gas draw.  O2 hooked and reassessed at 93%

## 2012-09-25 NOTE — ED Provider Notes (Addendum)
CSN: [id number]     Arrival date & time 09/25/12  0848 History   First MD Initiated Contact with Patient 09/25/12 [phone]     Chief Complaint  Patient presents with  . Shortness of Breath  . Hemoptysis   (Consider location/radiation/quality/duration/timing/severity/associated sxs/prior Treatment) HPI Comments: Patient presents with a five-day history of worsening shortness of breath. She does have a cough which is productive of some white yellow sputum. She also notes that she's either coughed up a return of some blood. She denies any ongoing hematemesis. She denies any chest pain. She denies any known fevers. She denies any leg swelling other than some swelling around her right ankle where she twisted a few days ago. She does have a history of bacterial endocarditis in 2012 with a subsequent mitral valve and tricuspid valve replacement which was done at [facility]. She's had no other cardiac problems and no issues related to the valve replacement. She denies any ongoing lung problems. She is a smoker and she did use inhalers following the hospitalization for the valve replacement she says she hasn't been using any ongoing inhalers.  Patient is a 42 y.o. female presenting with shortness of breath.  Shortness of Breath Associated symptoms: cough and vomiting   Associated symptoms: no abdominal pain, no chest pain, no diaphoresis, no fever, no headaches and no rash     Past Medical History  Diagnosis Date  . Bacterial endocarditis     s/p MVR and TVR at [facility] 06/2010; normal cors by cath 06/2010 at [facility]  . DJD (degenerative joint disease)   . Hepatitis C     history of  . Anemia   . Hypothyroidism   . Stroke   . Hepatitis C   . Anxiety   . Mental disorder   . FAOZHYQM(578.4)    Past Surgical History  Procedure Laterality Date  . Cholecystectomy    . Tonsillectomy and adenoidectomy    . Valve replacement    . Appendectomy    . Coronary artery bypass graft      [place]    [facility]          [place] [facility]   [place]  . Cardiac catheterization    . Cataract extraction    . Multiple extractions with alveoloplasty  09/01/2011    Procedure: MULTIPLE EXTRACION WITH ALVEOLOPLASTY;  Surgeon: Georgia Lopes, DDS;  Location: [facility] OR;  Service: Oral Surgery;  Laterality: N/A;   Family History  Problem Relation Age of Onset  . Cancer    . Coronary artery disease     History  Substance Use Topics  . Smoking status: Current Every Day Smoker -- 1.00 packs/day for 30 years    Types: Cigarettes  . Smokeless tobacco: Not on file     Comment: somkes about 1/2 pack a day  . Alcohol Use: No   OB History   Grav Para Term Preterm Abortions TAB SAB Ect Mult Living                 Review of Systems  Constitutional: Positive for fatigue. Negative for fever, chills and diaphoresis.  HENT: Negative for congestion, rhinorrhea and sneezing.   Eyes: Negative.   Respiratory: Positive for cough and shortness of breath. Negative for chest tightness.   Cardiovascular: Negative for chest pain and leg swelling.  Gastrointestinal: Positive for nausea and vomiting. Negative for abdominal pain, diarrhea and blood in stool.  Genitourinary: Negative for frequency, hematuria, flank pain and difficulty urinating.  Musculoskeletal:  Negative for back pain and arthralgias.  Skin: Negative for rash.  Neurological: Positive for light-headedness. Negative for dizziness, speech difficulty, weakness, numbness and headaches.    Allergies  Review of patient's allergies indicates no known allergies.  Home Medications   Current Outpatient Rx  Name  Route  Sig  Dispense  Refill  . aspirin 81 MG tablet   Oral   Take 81 mg by mouth daily.           . Aspirin-Acetaminophen-Caffeine (GOODY HEADACHE PO)   Oral   Take 1 packet by mouth 3 (three) times daily as needed (Pain).          [name] gabapentin (NEURONTIN) 600 MG tablet   Oral   Take 600 mg by mouth daily.         [name] ibuprofen (ADVIL,MOTRIN) 200 MG tablet    Oral   Take 600 mg by mouth daily as needed (Swelling).         [name] OVER THE COUNTER MEDICATION   Oral   Take 1 tablet by mouth 4 (four) times daily as needed (Swelling).          BP 116/82  Pulse 103  Temp(Src) 97.5 F (36.4 C) (Oral)  Resp 26  SpO2 95%  LMP 09/20/2012 Physical Exam  Constitutional: She is oriented to person, place, and time. She appears well-developed and well-nourished.  HENT:  Head: Normocephalic and atraumatic.  Mouth/Throat: Oropharynx is clear and moist.  Eyes: Pupils are equal, round, and reactive to light.  Neck: Normal range of motion. Neck supple.  Cardiovascular: Normal rate, regular rhythm and normal heart sounds.   Pulmonary/Chest: She is in respiratory distress. She has no wheezes. She has rales (bilateral rales). She exhibits no tenderness.  Patient is in some mild respiratory distress with increased work of breathing and hypoxia. When I was in the room she had an oxygen saturation of 93% on 4 L. She does have noted tachypnea and is talking in short sentences.  Abdominal: Soft. Bowel sounds are normal. There is no tenderness. There is no rebound and no guarding.  Musculoskeletal: Normal range of motion. She exhibits edema.  There some mild edema to the right lower leg around the area where she sprained her right ankle. There is no calf tenderness.  Lymphadenopathy:    She has no cervical adenopathy.  Neurological: She is alert and oriented to person, place, and time.  Skin: Skin is warm and dry. No rash noted.  Psychiatric: She has a normal mood and affect.    ED Course  Procedures (including critical care time) Labs Review Results for orders placed during the hospital encounter of 09/25/12  CBC WITH DIFFERENTIAL      Result Value Range   WBC 14.0 (*) 4.0 - 10.5 K/uL   RBC 4.94  3.87 - 5.11 MIL/uL   Hemoglobin 15.2 (*) 12.0 - 15.0 g/dL   HCT 04.5  40.9 - 81.1 %   MCV 90.5  78.0 - 100.0 fL   MCH 30.8  26.0 - 34.0 pg   MCHC 34.0  30.0 -  36.0 g/dL   RDW 91.4 (*) 78.2 - 95.6 %   Platelets 135 (*) 150 - 400 K/uL   Neutrophils Relative % 93 (*) 43 - 77 %   Neutro Abs 13.0 (*) 1.7 - 7.7 K/uL   Lymphocytes Relative 4 (*) 12 - 46 %   Lymphs Abs 0.6 (*) 0.7 - 4.0 K/uL   Monocytes Relative 2 (*) 3 -  12 %   Monocytes Absolute 0.3  0.1 - 1.0 K/uL   Eosinophils Relative 0  0 - 5 %   Eosinophils Absolute 0.0  0.0 - 0.7 K/uL   Basophils Relative 0  0 - 1 %   Basophils Absolute 0.1  0.0 - 0.1 K/uL  COMPREHENSIVE METABOLIC PANEL      Result Value Range   Sodium 132 (*) 135 - 145 mEq/L   Potassium 4.3  3.5 - 5.1 mEq/L   Chloride 97  96 - 112 mEq/L   CO2 21  19 - 32 mEq/L   Glucose, Bld 131 (*) 70 - 99 mg/dL   BUN 13  6 - 23 mg/dL   Creatinine, Ser 1.61  0.50 - 1.10 mg/dL   Calcium 9.1  8.4 - 09.6 mg/dL   Total Protein 7.4  6.0 - 8.3 g/dL   Albumin 3.0 (*) 3.5 - 5.2 g/dL   AST 24  0 - 37 U/L   ALT 12  0 - 35 U/L   Alkaline Phosphatase PENDING  39 - 117 U/L   Total Bilirubin 1.7 (*) 0.3 - 1.2 mg/dL   GFR calc non Af Amer 82 (*) >90 mL/min   GFR calc Af Amer >90  >90 mL/min  PRO B NATRIURETIC PEPTIDE      Result Value Range   Pro B Natriuretic peptide (BNP) 6304.0 (*) 0 - 125 pg/mL  ETHANOL      Result Value Range   Alcohol, Ethyl (B) <11  0 - 11 mg/dL  POCT I-STAT TROPONIN I      Result Value Range   Troponin i, poc 0.49 (*) 0.00 - 0.08 ng/mL   Comment NOTIFIED PHYSICIAN     Comment 3            Dg Ankle Complete Right  09/20/2012   *RADIOLOGY REPORT*  Clinical Data: Pain, swelling, redness at the anterior and lateral aspects of the ankle.  No known injury.  RIGHT ANKLE - COMPLETE 3+ VIEW  Comparison: None.  Findings: The osseous structures of the ankle are normal.  No appreciable arthritis.  There is soft tissue swelling around the ankle.  Small ankle effusion.  IMPRESSION: No osseous abnormality.  Soft tissue swelling and ankle effusion.   Original Report Authenticated By: Francene Boyers, M.D.      Date: 09/25/2012   Rate: 103  Rhythm: sinus tachycardia  QRS Axis: normal  Intervals: normal  ST/T Wave abnormalities: nonspecific ST/T changes and new t wave inversions inferiorly  Conduction Disutrbances:none  Narrative Interpretation:   Old EKG Reviewed: changes noted   Imaging Review Dg Chest Port 1 View  09/25/2012   CLINICAL DATA:  42 year old female with chest pain radiating to the upper extremities with shortness of Breath nausea and diaphoresis.  EXAM: PORTABLE CHEST - 1 VIEW  COMPARISON:  08/28/2011 and earlier.  FINDINGS: Portable AP semi upright view at 0942 hrs. Diffuse increased interstitial opacity, somewhat worse and the common confluent in the right lung. Lower lung volumes. No pneumothorax. Possible small effusions. Normal cardiac size and mediastinal contours. Sequelae of median sternotomy. Visualized tracheal air column is within normal limits.  IMPRESSION: Right greater than left diffuse increased interstitial opacity. Main differential considerations include acute asymmetric pulmonary edema and viral/atypical pneumonia. Small effusions suspected.   Electronically Signed   By: Augusto Gamble M.D.   On: 09/25/2012 10:25    MDM   1. CHF (congestive heart failure)   2. Hypoxia    Patient presented  with shortness of breath and hypoxia. She had diffuse rales on exam. I do not hear any new murmurs. She is noted to be in CHF. She was started on nitroglycerin drip and given Lasix. Her nitroglycerin drip was titrated carefully do to her slightly low blood pressure. Her troponin is elevated but she has no ST elevation on EKG. I did consult cardiology to come and see the patient.  CRITICAL CARE Performed by: Safir Michalec Total critical care time: 60 Critical care time was exclusive of separately billable procedures and treating other patients. Critical care was necessary to treat or prevent imminent or life-threatening deterioration. Critical care was time spent personally by me on the following  activities: development of treatment plan with patient and/or surrogate as well as nursing, discussions with consultants, evaluation of patient's response to treatment, examination of patient, obtaining history from patient or surrogate, ordering and performing treatments and interventions, ordering and review of laboratory studies, ordering and review of radiographic studies, pulse oximetry and re-evaluation of patient's condition.  Patient was seen by Dr. Ladona Ridgel, the on-call cardiologist. She is requesting the hospitalist to admit the patient. She is not convinced that her symptoms are completely related to the CHF and feels like she also needs to be evaluated for pneumonia. I did go ahead and cover her with antibiotics. I will consult hospitalist for admission.   Rolan Bucco, MD 09/25/12 1119  Rolan Bucco, MD 09/25/12 1158

## 2012-09-25 NOTE — Progress Notes (Signed)
[facility] follow-up progress note (same day)  Patient reviewed on step down unit-looking much better, diuresis a lot. No chest pain, less short of breath but does seem anxious-Ativan added when necessary orders for anxiety Patient may have clear liquid diet and gradually as tolerated if she tolerates.  Okay to leave out Foley catheter  Pleas Koch, MD [facility] [phone]

## 2012-09-25 NOTE — H&P (Addendum)
[facility] History and Physical  MERSADIES PETREE UUV:[id number] DOB: [birth year] DOA: 09/25/2012  Referring physician: Fredderick Phenix, ED MD PCP: Provider Not In System  Specialists: Cardiology, [place]  Chief Complaint: New SOB  HPI: [name] is a complicated 42 y.o. ? known history and MSSA endocarditis status post mitral valve, tricuspid valve repair [facility] 2012, after a bout of sepsis at [facility] 06/11/2010 [please see dr Fabio Bering excellent note 10/24/10], history HCV not currently on treatment, polysubstance abuse, teeth extractions who presented to [facility] emergency room 09/25/2012 = shortness of breath of severe nature starting 09/24/2012 p.m. She states that in CBC over the past 3 months she's been increasingly short of breath. She's not on any diuretics, has not been told in the past that she has CHF. She states that positional changes with her shortness of make this worse. In other words she was unable to sleep flat last night and had to sit straight up She denies fever chills however had some sputum which was tinge early this morning. A 1 view chest x-ray was done in the emergency room that was suggestive of atelectasis vs. pneumonia vs. fluid overload. She denies any sick contacts but does have occupational exposure to mold and in fact lives in a trailer with a friend for the past 3 weeks and thinks that this may have in a contributor. 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 She has seen Dr. Eden Emms in the outpatient setting for her cardiology care   Review of Systems: Denies fever, chills, diarrhea, current chest pain, nausea, vomiting + cough + sputum + hemoptysis, Rash, exposure to ill contacts, falls, seizure, weakness on any one side of the body, blurred vision, double vision, polyuria Negative except as above   Past Medical History   Diagnosis Date  . Bacterial endocarditis     s/p MVR and TVR at [facility] 06/2010; normal cors by cath 06/2010 at [facility]  . DJD (degenerative joint disease)   . Hepatitis C     history of  . Anemia   . Hypothyroidism   . Stroke   . Hepatitis C   . Anxiety   . Mental disorder   . Headache(784.0)    Admission 04/13/2000 R hand cellulitis Admission 07/22/2010 for such emboli secondary to endocarditis with subarachnoid hemorrhage and clinically compensated CHF with critical illness myopathy Admission 09/01/2011 with multiple extractions of #3, 4, 5, 8, 9, 10, 11, 12, 13, 14, 20, 30 teeth with alveoplasty  Past Surgical History  Procedure Laterality Date  . Cholecystectomy    . Tonsillectomy and adenoidectomy    . Valve replacement    . Appendectomy    . Coronary artery bypass graft      [place]    [facility]          [place] [place]  . Cardiac catheterization    . Cataract extraction    . Multiple extractions with alveoloplasty  09/01/2011    Procedure: MULTIPLE EXTRACION WITH ALVEOLOPLASTY;  Surgeon: Georgia Lopes, DDS;  Location: [facility] OR;  Service: Oral Surgery;  Laterality: N/A;   Social History:  reports that she has been smoking Cigarettes.  She has a 30 pack-year smoking history. She does not have any smokeless tobacco history on file. She reports that she uses illicit drugs (Marijuana  and Cocaine). She reports that she does not drink alcohol. Currently lives with friend in a trailer Does not know her family history his parents died or separated from her when she was younger  No Known Allergies  Family History  Problem Relation Age of Onset  . Cancer    . Coronary artery disease      Prior to Admission medications   Medication Sig Start Date End Date Taking? Authorizing Provider  aspirin 81 MG tablet Take 81 mg by mouth daily.     Yes Historical Provider, MD  Aspirin-Acetaminophen-Caffeine (GOODY HEADACHE PO) Take 1 packet by mouth 3 (three) times daily as needed (Pain).     Yes Historical Provider, MD  gabapentin (NEURONTIN) 600 MG tablet Take 600 mg by mouth daily.   Yes Historical Provider, MD  ibuprofen (ADVIL,MOTRIN) 200 MG tablet Take 600 mg by mouth daily as needed (Swelling).   Yes Historical Provider, MD  OVER THE COUNTER MEDICATION Take 1 tablet by mouth 4 (four) times daily as needed (Swelling).   Yes Historical Provider, MD   Physical Exam: Filed Vitals:   09/25/12 1201  BP: 111/66  Pulse: 114  Temp:   Resp: 17     General:  Alert anxious Caucasian female, breathing about 25 times a minute  Eyes: EOMI, NCAT  ENT: Poor dentition with multiple extractions  Neck: No JVD that I can assess, no bruit  Cardiovascular: S1-S2 tachycardic, rate regular in the 100s  Respiratory: Diffuse crackles right posterior hemithorax, left-sided clear, increasing tactile vocal resonance and fremitus on the right ostia lung bases posteriorly  Abdomen: Soft nontender nondistended no rebound  Skin: Grade 1-2 lower extremity bilaterally edema  Musculoskeletal: Range of motion intact  Psychiatric: Anxious  Neurologic: Moving all 4 limbs equally  Labs on Admission:  Basic Metabolic Panel:  Recent Labs Lab 09/25/12 0936  NA 132*  K 4.3  CL 97  CO2 21  GLUCOSE 131*  BUN 13  CREATININE 0.85  CALCIUM 9.1   Liver Function Tests:  Recent Labs Lab 09/25/12 0936  AST 24  ALT 12  ALKPHOS 108  BILITOT 1.7*  PROT 7.4  ALBUMIN 3.0*   No results found for this basename: LIPASE, AMYLASE,  in the last 168 hours No results found for this basename: AMMONIA,  in the last 168 hours CBC:  Recent Labs Lab 09/25/12 0936  WBC 14.0*  NEUTROABS 13.0*  HGB 15.2*  HCT 44.7  MCV 90.5  PLT 135*   Cardiac Enzymes: No results found for this basename: CKTOTAL, CKMB, CKMBINDEX, TROPONINI,  in the last 168 hours  BNP (last 3 results)  Recent Labs  09/25/12 0936  PROBNP 6304.0*   CBG: No results found for this basename: GLUCAP,  in the last 168  hours  Radiological Exams on Admission: Dg Chest Port 1 View  09/25/2012   CLINICAL DATA:  42 year old female with chest pain radiating to the upper extremities with shortness of Breath nausea and diaphoresis.  EXAM: PORTABLE CHEST - 1 VIEW  COMPARISON:  08/28/2011 and earlier.  FINDINGS: Portable AP semi upright view at 0942 hrs. Diffuse increased interstitial opacity, somewhat worse and the common confluent in the right lung. Lower lung volumes. No pneumothorax. Possible small effusions. Normal cardiac size and mediastinal contours. Sequelae of median sternotomy. Visualized tracheal air column is within normal limits.  IMPRESSION: Right greater than left diffuse increased interstitial opacity. Main differential considerations include acute asymmetric pulmonary edema and viral/atypical pneumonia. Small effusions suspected.   Electronically  Signed   By: Augusto Gamble M.D.   On: 09/25/2012 10:25    EKG: Independently reviewed. Sinus tachycardia PR interval 0.12 QRS axis 45 wondering baseline however there does appear to be flipped T waves in V4 through 6-compared to prior EKG dated 10/24/10 these are not new  Assessment/Plan Active Problems:   * No active hospital problems. *   1. Acute decompensated likely diastolic heart failure-patient has multiple valve repairs and has been seen by cardiology-I. suspect a semiurgent echo will be required to determine hemodynamics which I have ordered-we will currently diuresis her with Lasix 40 mg-given that she is pre-load dependent, I hesitate to continue the nitroglycerin infusion started by the emergency room and will defer decisions about these Vaso-active medications to cardiology, as her blood pressures are somewhat marginal in the 90s over 60s right now-suspect etiology of heart failure is multifactorial given her history of TVR, MVR and being on ibuprofen which can as an NSAID cause/precipitate heart failure 2. ? Community acquired pneumonia-get two-view  chest x-ray to determine whether this is actually the case-I suspect that she does not have pneumonia despite an elevated white count-will continue empiric CAP coverage for right now, suspect we can either narrow quickly or d/c completely once she diureses completely 3. 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  Higher threshold to cover for Endocarditis at this point, however 4. H/o Hep C,, apparently not on Rx.  Needs OP [facility] follow-up after this hospital stay 5. History of CAD status post CABG 2005-per cardiology 6. History of? Headaches continue Tylenol    Code Status: Cardiology  Family Communication: None at bedside Disposition Plan: Inpatient step down, Expect LOS 3-4 days at least--suspect cardiology will need to consider various interventions for her CHF and multiple valve issues  Time spent: 47  Mahala Menghini [facility] Pager [phone]  If 7PM-7AM, please contact night-coverage [email or web address] Password [facility] 09/25/2012, 12:22 PM

## 2012-09-25 NOTE — ED Notes (Signed)
Respiratory tech, Tresa Endo, at bedside drawing blood gas

## 2012-09-25 NOTE — ED Notes (Signed)
Pt had an O2 of 88%, per MD O2 increased to 4L, O2 now at 96% with HOB elevated.

## 2012-09-25 NOTE — ED Notes (Signed)
Gave report to [employer] on [employer]

## 2012-09-25 NOTE — ED Notes (Signed)
Dr. Belfi at bedside 

## 2012-09-25 NOTE — ED Notes (Signed)
Pt being taken to xray before being transported to floor. Pt will be transported to step down via primary RN.

## 2012-09-25 NOTE — ED Notes (Signed)
Cardiology at bedside.

## 2012-09-25 NOTE — H&P (Signed)
Admit date: 09/25/2012 Referring Physician Dr. Fredderick Phenix Primary Cardiologist Dr. Eden Emms Chief complaint/reason for admission:SOB/CHF  HPI: History of Present Illness:  [name] is a 42 y.o. female with  a h/o HCV and polysubstance abuse. She had mitral valve and tricuspid valve replacement on 07/05/10 by Dr. Silvestre Mesi at [facility] secondary to endocarditis. She was originally admitted to [facility] 5/292012 with sepsis. Echo demonstrated vegetations on the TV and MV with moderate MR and moderate TR. Blood cultures grew out MSSA. She had multi-organ system failure, was intubated secondary to ARDS and was tx to [facility]. She had elevated cardiac enzymes, suffered a left fronto-parietal subarachnoid hemorrhage, pulmonary infarcts, ARF requiring brief hemodialysis. She was tx back to [facility] at one point and then was tx to [facility] for MVR and TVR. She had pericardial tissue valves. She had AFlutter post op and was placed on amiodarone. She was tx to [facility] for inpatient rehab. She was there 7/9-7/20/2012.  She notes some hx of IVDA. She also had a UTI prior to getting sick.   Today the patient presents with a five-day history of worsening shortness of breath. She does have a cough which is productive of some white yellow sputum. She also notes that she's either coughed up a return of some blood. She denies any ongoing hematemesis. She denies any chest pain. She has been having chills off and on for the past few weeks.. She denies any leg swelling other than some swelling around her right ankle where she twisted a few days ago. She does have a history of bacterial endocarditis in 2012 with a subsequent mitral valve and tricuspid valve replacement which was done at [facility]. She's had no other cardiac problems and no issues related to the valve replacement. She denies any ongoing lung problems. She is a smoker and she did use inhalers following the hospitalization for the valve replacement she says she hasn't been using any ongoing inhalers. She  was found today to have an elevated BNP c/w CHF.  Chest xray showed  Right greater than left diffuse increased interstitial opacity consistent with either acute asymmetric pulmonary edema  and viral/atypical pneumonia. She has an elevated WBC as well at 14K.      PMH:    Past Medical History  Diagnosis Date  . Bacterial endocarditis     s/p MVR and TVR at [facility] 06/2010; normal cors by cath 06/2010 at [facility]  . DJD (degenerative joint disease)   . Hepatitis C     history of  . Anemia   . Hypothyroidism   . Stroke   . Hepatitis C   . Anxiety   . Mental disorder   . Headache(784.0)     PSH:    Past Surgical History  Procedure Laterality Date  . Cholecystectomy    . Tonsillectomy and adenoidectomy    . Valve replacement    . Appendectomy    . Coronary artery bypass graft      2012    [facility]          [place] Je[facility]  [facility] [place]  . Cardiac catheterization    . Cataract extraction    . Multiple extractions with alveoloplasty  09/01/2011    Procedure: MULTIPLE EXTRACION WITH ALVEOLOPLASTY;  Surgeon: Georgia Lopes, DDS;  Location: [facility] OR;  Service: Oral Surgery;  Laterality: N/A;    ALLERGIES:   Review of patient's allergies indicates no known allergies.  Prior to Admit Meds:   (Not in a hospital admission) Family HX:  Family History  Problem Relation Age of Onset  . Cancer    . Coronary artery disease     Social HX:    History   Social History  . Marital Status: Legally Separated    Spouse Name: N/A    Number of Children: N/A  . Years of Education: N/A   Occupational History  . Not on file.   Social History Main Topics  . Smoking status: Current Every Day Smoker -- 1.00 packs/day for 30 years    Types: Cigarettes  . Smokeless tobacco: Not on file     Comment: somkes about 1/2 pack a day  . Alcohol Use: No  . Drug Use: Yes    Special: Marijuana, Cocaine  . Sexual Activity: Not on file   Other Topics Concern  . Not on file   Social History Narrative  . No  narrative on file     ROS:  All 11 ROS were addressed and are negative except what is stated in the HPI  PHYSICAL EXAM Filed Vitals:   09/25/12 1030  BP: 116/82  Pulse: 103  Temp:   Resp: 26   General: Well developed, well nourished, in no acute distress Head: Eyes PERRLA, No xanthomas.   Normal cephalic and atramatic  Lungs:   Crackles at bases bilaterally and anteriorly on the right Heart:   HRRR S1 S2 Pulses are 2+ & equal.            No carotid bruit. No JVD.  No abdominal bruits. No femoral bruits. Abdomen: Bowel sounds are positive, abdomen soft and non-tender without masses Extremities:   No clubbing, cyanosis or edema.  DP +1 Neuro: Alert and oriented X 3. Psych:  Good affect, responds appropriately   Labs:   Lab Results  Component Value Date   WBC 14.0* 09/25/2012   HGB 15.2* 09/25/2012   HCT 44.7 09/25/2012   MCV 90.5 09/25/2012   PLT 135* 09/25/2012    Recent Labs Lab 09/25/12 0936  NA 132*  K 4.3  CL 97  CO2 21  BUN 13  CREATININE 0.85  CALCIUM 9.1  PROT 7.4  BILITOT 1.7*  ALKPHOS PENDING  ALT 12  AST 24  GLUCOSE 131*   No results found for this basename: CKTOTAL, CKMB, CKMBINDEX, TROPONINI   No results found for this basename: PTT   Lab Results  Component Value Date   INR 1.03 09/01/2011         Radiology:  Right greater than left diffuse increased interstitial opacity. Main  differential considerations include acute asymmetric pulmonary edema  and viral/atypical pneumonia. Small effusions suspected   EKG:  Sinus tachycardia with diffuse ST/T wave abnormality  ASSESSMENT:  1.  Acute CHF - 2D echo pending - most likely diastolic in etiology 2.  History of endocarditis s/p MVR/TVR 06/2010 3.  History of subarachnoid hemorrhage 06/2010 4.  Post-op afib on Amio 5.  Polysubstance abuse 7.  CAD s/p remote CABG 6.  ? PNA with elevated WBC, chills and chest xray concerning for PNA  PLAN:   1.  Check 2D echo to assess valvular  function/LVF 2.  Blood cultures x 2 3.  IV Lasix  Quintella Reichert, MD  09/25/2012  11:44 AM

## 2012-09-26 DIAGNOSIS — T826XXA Infection and inflammatory reaction due to cardiac valve prosthesis, initial encounter: Secondary | ICD-10-CM

## 2012-09-26 DIAGNOSIS — I330 Acute and subacute infective endocarditis: Secondary | ICD-10-CM

## 2012-09-26 DIAGNOSIS — T827XXA Infection and inflammatory reaction due to other cardiac and vascular devices, implants and grafts, initial encounter: Principal | ICD-10-CM

## 2012-09-26 DIAGNOSIS — Z953 Presence of xenogenic heart valve: Secondary | ICD-10-CM

## 2012-09-26 HISTORY — PX: VALVE REPLACEMENT: SUR13

## 2012-09-26 HISTORY — DX: Presence of xenogenic heart valve: Z95.3

## 2012-09-26 LAB — COMPREHENSIVE METABOLIC PANEL
ALT: 12 U/L (ref 0–35)
Alkaline Phosphatase: 99 U/L (ref 39–117)
BUN: 16 mg/dL (ref 6–23)
CO2: 25 mEq/L (ref 19–32)
Chloride: 95 mEq/L — ABNORMAL LOW (ref 96–112)
GFR calc Af Amer: 59 mL/min — ABNORMAL LOW (ref >90)
GFR calc non Af Amer: 51 mL/min — ABNORMAL LOW (ref >90)
Glucose, Bld: 164 mg/dL — ABNORMAL HIGH (ref 70–99)
Potassium: 3.6 mEq/L (ref 3.5–5.1)
Total Bilirubin: 1.2 mg/dL (ref 0.3–1.2)
Total Protein: 7.2 g/dL (ref 6.0–8.3)

## 2012-09-26 LAB — PROTIME-INR: Prothrombin Time: 15.1 seconds (ref 11.6–15.2)

## 2012-09-26 LAB — TROPONIN I: Troponin I: <0.3 ng/mL (ref <0.30)

## 2012-09-26 MED ORDER — HYDROCODONE-ACETAMINOPHEN 5-325 MG PO TABS
1.0000 | ORAL_TABLET | Freq: Once | ORAL | Status: AC
  Administered 2012-09-26: 1 via ORAL
  Filled 2012-09-26: qty 1

## 2012-09-26 MED ORDER — ALBUTEROL SULFATE (5 MG/ML) 0.5% IN NEBU: 2.5000 mg | INHALATION_SOLUTION | Freq: Four times a day (QID) | RESPIRATORY_TRACT | Status: DC | PRN

## 2012-09-26 MED ORDER — NICOTINE 21 MG/24HR TD PT24: 1.0000 | MEDICATED_PATCH | Freq: Every day | TRANSDERMAL | Status: DC

## 2012-09-26 MED ORDER — GENTAMICIN IN SALINE 1.2-0.9 MG/ML-% IV SOLN: 60.0000 mg | Freq: Three times a day (TID) | INTRAVENOUS | Status: DC

## 2012-09-26 MED ORDER — INFLUENZA VAC SPLIT QUAD 0.5 ML IM SUSP: 0.5000 mL | INTRAMUSCULAR | Status: DC

## 2012-09-26 MED ORDER — NICOTINE 21 MG/24HR TD PT24
21.0000 mg | MEDICATED_PATCH | Freq: Every day | TRANSDERMAL | Status: DC
  Administered 2012-09-26: 21 mg via TRANSDERMAL
  Filled 2012-09-26: qty 1

## 2012-09-26 MED ORDER — ISOSORBIDE DINITRATE 20 MG PO TABS: 20.0000 mg | ORAL_TABLET | Freq: Two times a day (BID) | ORAL | Status: DC

## 2012-09-26 MED ORDER — FUROSEMIDE 10 MG/ML IJ SOLN: 40.0000 mg | Freq: Two times a day (BID) | INTRAMUSCULAR | Status: DC

## 2012-09-26 MED ORDER — RIFAMPIN 300 MG PO CAPS
300.0000 mg | ORAL_CAPSULE | Freq: Three times a day (TID) | ORAL | Status: DC
Start: 2012-09-26 — End: 2012-09-26
  Administered 2012-09-26: 300 mg via ORAL
  Filled 2012-09-26 (×3): qty 1

## 2012-09-26 MED ORDER — PNEUMOCOCCAL VAC POLYVALENT 25 MCG/0.5ML IJ INJ: 0.5000 mL | INJECTION | INTRAMUSCULAR | Status: DC

## 2012-09-26 MED ORDER — RIFAMPIN 300 MG PO CAPS: 300.0000 mg | ORAL_CAPSULE | Freq: Three times a day (TID) | ORAL | Status: DC

## 2012-09-26 MED ORDER — VANCOMYCIN HCL IN DEXTROSE 750-5 MG/150ML-% IV SOLN: 750.0000 mg | Freq: Two times a day (BID) | INTRAVENOUS | Status: DC

## 2012-09-26 MED ORDER — GENTAMICIN IN SALINE 1.2-0.9 MG/ML-% IV SOLN
60.0000 mg | Freq: Three times a day (TID) | INTRAVENOUS | Status: DC
  Administered 2012-09-26: 60 mg via INTRAVENOUS
  Filled 2012-09-26 (×3): qty 50

## 2012-09-26 MED ORDER — VANCOMYCIN HCL IN DEXTROSE 750-5 MG/150ML-% IV SOLN
750.0000 mg | Freq: Two times a day (BID) | INTRAVENOUS | Status: DC
  Administered 2012-09-26: 750 mg via INTRAVENOUS
  Filled 2012-09-26 (×2): qty 150

## 2012-09-26 MED ORDER — ACETAMINOPHEN 325 MG PO TABS
650.0000 mg | ORAL_TABLET | ORAL | Status: DC | PRN
  Administered 2012-09-26 (×2): 650 mg via ORAL
  Filled 2012-09-26 (×2): qty 2

## 2012-09-26 NOTE — Progress Notes (Signed)
Transferring to [facility] via [facility] pt belonging with daugther .

## 2012-09-26 NOTE — Discharge Summary (Signed)
Physician Discharge Summary  [name] ZOX:[id number] DOB: [birth year] DOA: 09/25/2012  PCP: Provider Not In System  Admit date: 09/25/2012 Discharge date: 09/26/2012  Time spent: 50 minutes  Recommendations for Outpatient Follow-up:  1. Patient will need further tertiary specialty care at [facility]-Dr. Armanda Magic of [facility] cardiology has discussed with physicians over there regarding transfer 2. Please continue empiric gentamicin, vancomycin, rifampicin as per my discussion with Dr. Jerolyn Center of infectious disease 3. Would continue IV diuresis 4. Needs daily basic metabolic panel 5. Stable for telemetry floor  Discharge Diagnoses:  Principal Problem:   Bacterial endocarditis Active Problems:   Mitral regurgitation   Tricuspid regurgitation   Atrial flutter   ARDS (adult respiratory distress syndrome)   Acute diastolic CHF (congestive heart failure), NYHA class 3   Endocarditis of prosthetic valve   Discharge Condition: Stable  Diet recommendation: Heart healthy low-salt  Filed Weights   09/25/12 1551 09/26/12 0328  Weight: 79.5 kg (175 lb 4.3 oz) 76.6 kg (168 lb 14 oz)    History of present illness:  [name] is a complicated 42 y.o. ? known history and MSSA endocarditis status post mitral valve, tricuspid valve repair [facility] 2012, after a bout of sepsis at [facility] 06/11/2010 [please see dr Fabio Bering excellent note 10/24/10], history HCV not currently on treatment, polysubstance abuse, teeth extractions in 2013 who presented to [facility] emergency room 09/25/2012 = shortness of breath of severe nature starting 09/24/2012 p.m. She states that in CBC over the past 3 months she's been increasingly short of breath. She's not on any diuretics, has not been told in the past that she has CHF. 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  Pneumonia with new onset? CHF  Hospital Course:    1. Acute decompensated likely diastolic heart failure-patient has multiple valve repairs and has been seen by cardiology-initially started on nitroglycerin drip and diuresis with IV-was minus 1.5 L during short hospital stay. EF was 55-60% with mild concentric hypertrophy but no overt failure 2. Acute bacterial endocarditis with gram-positive cocci-echocardiogram showed vegetations on mitral valve prosthesis-Dr. Mayford Knife of cardiology discussed with physicians at [facility] regarding transfer for subspecialty cardiac care. She was accepted for the same. I discussed patient's case with Dr. Jerolyn Center of infectious disease, who recommended discontinuing below antibiotics in favor of gentamicin, vancomycin, rifampicin. 3. ? Community acquired pneumonia-unlikely. Initially he was transiently on vancomycin and cefepime as picture was not clear however see above. 4. 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 H/o Hep C-apparently not on Rx. Needs OP [facility] follow-up after this hospital stay 6. History of CAD status post CABG 2005-per cardiology 7. History of? Headaches continue Tylenol  Procedures:  ECHO   Consultations:  Cardiology  Discharge Exam: Filed Vitals:   09/26/12 1113  BP: 97/66  Pulse: 105  Temp: 97.5 F (36.4 C)  Resp: 17   Alert tired, no issues No c/o General: eomi, ncat Cardiovascular: s1 s2 no m/r/g Respiratory: clear  Discharge Instructions  Discharge Orders   Future Orders Complete By Expires   Diet - low sodium heart healthy  As directed    Discharge instructions  As directed    Comments:     Continue Vanc/Gent /Rifampin Follow Blood cultures which were + on  09/26/12   Increase activity slowly  As directed        Medication List    STOP taking these medications       GOODY HEADACHE PO      ibuprofen 200 MG tablet  Commonly known as:  ADVIL,MOTRIN     OVER THE COUNTER MEDICATION      TAKE these medications       aspirin 81 MG tablet  Take 81 mg by mouth daily.     furosemide 10 MG/ML injection  Commonly known as:  LASIX  Inject 4 mLs (40 mg total) into the vein 2 (two) times daily.     gabapentin 600 MG tablet  Commonly known as:  NEURONTIN  Take 600 mg by mouth daily.     gentamicin 1.2-0.9 MG/ML-%  Commonly known as:  GARAMYCIN  Inject 50 mLs (60 mg total) into the vein every 8 (eight) hours.     isosorbide dinitrate 20 MG tablet  Commonly known as:  ISORDIL  Take 1 tablet (20 mg total) by mouth 2 (two) times daily.     nicotine 21 mg/24hr patch  Commonly known as:  NICODERM CQ - dosed in mg/24 hours  Place 1 patch onto the skin daily.     rifampin 300 MG capsule  Commonly known as:  RIFADIN  Take 1 capsule (300 mg total) by mouth every 8 (eight) hours.     Vancomycin 750 MG/150ML Soln  Commonly known as:  VANCOCIN  Inject 150 mLs (750 mg total) into the vein every 12 (twelve) hours.       No Known Allergies    The results of significant diagnostics from this hospitalization (including imaging, microbiology, ancillary and laboratory) are listed below for reference.    Significant Diagnostic Studies: Dg Chest 1 View  09/25/2012   CLINICAL DATA:  Shortness of breath, hemoptysis  EXAM: CHEST - 1 VIEW  COMPARISON:  Frontal view of the chest same day.  FINDINGS: Cardiomediastinal silhouette is stable. Again noted bilateral interstitial prominence which may be due to edema or atypical pneumonia. Bilateral small pleural effusion.  IMPRESSION: Again noted bilateral interstitial prominence which may be due to edema or atypical pneumonia. Bilateral small pleural effusion.   Electronically Signed   By: Natasha Mead   On: 09/25/2012 16:00   Dg Ankle Complete Right  09/20/2012   *RADIOLOGY REPORT*  Clinical Data: Pain, swelling, redness at the anterior and  lateral aspects of the ankle.  No known injury.  RIGHT ANKLE - COMPLETE 3+ VIEW  Comparison: None.  Findings: The osseous structures of the ankle are normal.  No appreciable arthritis.  There is soft tissue swelling around the ankle.  Small ankle effusion.  IMPRESSION: No osseous abnormality.  Soft tissue swelling and ankle effusion.   Original Report Authenticated By: Francene Boyers, M.D.   Dg Chest Port 1 View  09/25/2012   CLINICAL DATA:  42 year old female with chest pain radiating to the upper extremities with shortness of Breath nausea and diaphoresis.  EXAM: PORTABLE CHEST - 1 VIEW  COMPARISON:  08/28/2011 and earlier.  FINDINGS: Portable AP semi upright view at 0942 hrs. Diffuse increased interstitial opacity, somewhat worse and the common confluent in the right lung. Lower lung volumes. No pneumothorax. Possible small effusions. Normal cardiac size and mediastinal contours. Sequelae of median sternotomy. Visualized tracheal air column is within normal limits.  IMPRESSION: Right greater than left diffuse increased interstitial opacity. Main differential considerations include acute asymmetric pulmonary edema and viral/atypical  pneumonia. Small effusions suspected.   Electronically Signed   By: Augusto Gamble M.D.   On: 09/25/2012 10:25    Microbiology: Recent Results (from the past 240 hour(s))  CULTURE, BLOOD (ROUTINE X 2)     Status: None   Collection Time    09/25/12 12:10 PM      Result Value Range Status   Specimen Description BLOOD LEFT ARM   Final   Special Requests BOTTLES DRAWN AEROBIC AND ANAEROBIC 10CC   Final   Culture  Setup Time     Final   Value: 09/25/2012 19:01     Performed at [facility]   Culture     Final   Value: GRAM POSITIVE COCCI IN CHAINS        BLOOD CULTURE RECEIVED NO GROWTH TO DATE CULTURE WILL BE HELD FOR 5 DAYS BEFORE ISSUING A FINAL NEGATIVE REPORT     Note: Gram Stain Report Called to,Read Back By and Verified With: ROBIN WOFFORD 09/26/12 @ 1PM BY RUSCOE  A.     Performed at [facility]   Report Status PENDING   Incomplete  CULTURE, BLOOD (ROUTINE X 2)     Status: None   Collection Time    09/25/12 12:15 PM      Result Value Range Status   Specimen Description BLOOD RIGHT HAND   Final   Special Requests BOTTLES DRAWN AEROBIC ONLY 10CC   Final   Culture  Setup Time     Final   Value: 09/25/2012 19:02     Performed at [facility]   Culture     Final   Value: GRAM POSITIVE COCCI IN CHAINS        BLOOD CULTURE RECEIVED NO GROWTH TO DATE CULTURE WILL BE HELD FOR 5 DAYS BEFORE ISSUING A FINAL NEGATIVE REPORT     Note: Gram Stain Report Called to,Read Back By and Verified With: ROBIN WOFFORD 09/26/12 @ 1PM BY RUSCOE A.     Performed at [facility]   Report Status PENDING   Incomplete  MRSA PCR SCREENING     Status: None   Collection Time    09/25/12  4:23 PM      Result Value Range Status   MRSA by PCR NEGATIVE  NEGATIVE Final   Comment:            The GeneXpert MRSA Assay (FDA     approved for NASAL specimens     only), is one component of a     comprehensive MRSA colonization     surveillance program. It is not     intended to diagnose MRSA     infection nor to guide or     monitor treatment for     MRSA infections.     Labs: Basic Metabolic Panel:  Recent Labs Lab 09/25/12 0936 09/25/12 1706 09/26/12 0435  NA 132*  --  134*  K 4.3  --  3.6  CL 97  --  95*  CO2 21  --  25  GLUCOSE 131*  --  164*  BUN 13  --  16  CREATININE 0.85 0.93 1.26*  CALCIUM 9.1  --  8.8   Liver Function Tests:  Recent Labs Lab 09/25/12 0936 09/26/12 0435  AST 24 28  ALT 12 12  ALKPHOS 108 99  BILITOT 1.7* 1.2  PROT 7.4 7.2  ALBUMIN 3.0* 2.8*   No results found for this basename: LIPASE, AMYLASE,  in the last  168 hours No results found for this basename: AMMONIA,  in the last 168 hours CBC:  Recent Labs Lab 09/25/12 0936 09/25/12 1706  WBC 14.0* 11.9*  NEUTROABS 13.0*  --   HGB 15.2* 14.5  HCT 44.7  43.3  MCV 90.5 89.8  PLT 135* 145*   Cardiac Enzymes:  Recent Labs Lab 09/25/12 1706 09/25/12 2219 09/26/12 0435 09/26/12 1028  CKTOTAL 25  --   --   --   CKMB 3.0  --   --   --   TROPONINI 0.42* 0.45* 0.60* <0.30   BNP: BNP (last 3 results)  Recent Labs  09/25/12 0936  PROBNP 6304.0*   CBG: No results found for this basename: GLUCAP,  in the last 168 hours     Signed:  Rhetta Mura  [facility] 09/26/2012, 2:18 PM

## 2012-09-26 NOTE — Progress Notes (Signed)
Report called to [facility] RN at [place], pt going to room 7119. Called [facility] for transport. Notified her daugther.

## 2012-09-26 NOTE — Progress Notes (Signed)
  Echocardiogram 2D Echocardiogram has been performed.  [name] 09/26/2012, 9:54 AM

## 2012-09-26 NOTE — Progress Notes (Signed)
ANTIBIOTIC CONSULT NOTE - INITIAL  Pharmacy Consult for gentamicin and vancomycin Indication: endocarditis of the MV prosthesis  No Known Allergies  Patient Measurements: Height: 5\' 4"  (162.6 cm) Weight: 168 lb 14 oz (76.6 kg) IBW/kg (Calculated) : 54.7 Adjusted Body Weight: 63.46 kg   Vital Signs: Temp: 97.2 F (36.2 C) (09/14 0720) Temp src: Oral (09/14 0720) BP: 103/67 mmHg (09/14 0720) Pulse Rate: 95 (09/14 0720) Intake/Output from previous day: 09/13 0701 - 09/14 0700 In: 360 [P.O.:360] Out: 2450 [Urine:2450] Intake/Output from this shift: Total I/O In: 360 [P.O.:360] Out: 250 [Urine:250]  Labs:  Recent Labs  09/25/12 0936 09/25/12 1706 09/26/12 0435  WBC 14.0* 11.9*  --   HGB 15.2* 14.5  --   PLT 135* 145*  --   CREATININE 0.85 0.93 1.26*   Estimated Creatinine Clearance: 57.1 ml/min (by C-G formula based on Cr of 1.26). No results found for this basename: VANCOTROUGH, Leodis Binet, VANCORANDOM, GENTTROUGH, GENTPEAK, GENTRANDOM, TOBRATROUGH, TOBRAPEAK, TOBRARND, AMIKACINPEAK, AMIKACINTROU, AMIKACIN,  in the last 72 hours   Microbiology: Recent Results (from the past 720 hour(s))  MRSA PCR SCREENING     Status: None   Collection Time    09/25/12  4:23 PM      Result Value Range Status   MRSA by PCR NEGATIVE  NEGATIVE Final   Comment:            The GeneXpert MRSA Assay (FDA     approved for NASAL specimens     only), is one component of a     comprehensive MRSA colonization     surveillance program. It is not     intended to diagnose MRSA     infection nor to guide or     monitor treatment for     MRSA infections.    Medical History: Past Medical History  Diagnosis Date  . Bacterial endocarditis     s/p MVR and TVR at [facility] 06/2010; normal cors by cath 06/2010 at [facility]  . DJD (degenerative joint disease)   . Hepatitis C     history of  . Anemia   . Hypothyroidism   . Stroke   . Hepatitis C   . Anxiety   . Mental disorder   . Headache(784.0)      Medications:  Scheduled:  . furosemide  40 mg Intravenous BID  . gabapentin  600 mg Oral Daily  . heparin  5,000 Units Subcutaneous Q8H  . [START ON 09/27/2012] influenza vac split quadrivalent PF  0.5 mL Intramuscular Tomorrow-1000  . isosorbide dinitrate  20 mg Oral BID  . nicotine  21 mg Transdermal Daily  . [START ON 09/27/2012] pneumococcal 23 valent vaccine  0.5 mL Intramuscular Tomorrow-1000  . rifampin  300 mg Oral Q8H  . sodium chloride  3 mL Intravenous Q12H   Assessment: 51 YOF presents with SOB with known hx MSSA endocarditis s/p MVR/TVR 06/2010. Echo 9/14 show large vegetations on both leaflets of the bioprosthetic MV, pharmacy consulted to dose gentamicin and vancomycin for endocarditis.  SCr 1.26, up from 0.93 WBC 11.9, CrCl 57.1  9/13 BCx>>  Goal of Therapy:  Vancomycin trough level 15-20 mcg/ml Gentamicin peak 3-4 trough level <1 mcg/ml for synergy  Plan:  Start Vancomycin 750mg  Q12h Start Gentamicin 60 mg Q8h (~1mg /kg/dose based on adjBW) Duration will most likely be 6 weeks Monitor WBC, renal function, clinical status, C&S, peak/ troughs at steady state Follow up with transfer to [facility]. Artelia Laroche, PharmD Clinical Pharmacist -  Resident Pager: [phone] Phone: [phone] 09/26/2012 11:41 AM

## 2012-09-26 NOTE — Progress Notes (Signed)
SUBJECTIVE:  Complains of SOB  OBJECTIVE:   Vitals:   Filed Vitals:   09/25/12 2349 09/26/12 0128 09/26/12 0328 09/26/12 0720  BP: 105/50  99/59 103/67  Pulse: 109 107 104 95  Temp: 98 F (36.7 C)  98.9 F (37.2 C) 97.2 F (36.2 C)  TempSrc: Oral  Oral Oral  Resp: 31 53 31 25  Height:      Weight:   76.6 kg (168 lb 14 oz)   SpO2: 92% 90% 91% 94%   I&O's:   Intake/Output Summary (Last 24 hours) at 09/26/12 1042 Last data filed at 09/26/12 1000  Gross per 24 hour  Intake    720 ml  Output   2700 ml  Net  -1980 ml   TELEMETRY: Reviewed telemetry pt in NSR:     PHYSICAL EXAM General: Well developed, well nourished, in no acute distress Head: Eyes PERRLA, No xanthomas.   Normal cephalic and atramatic  Lungs: diffuse crackles anteriorly and posteriorly Heart:   HRRR S1 S2 Pulses are 2+ & equal. Abdomen: Bowel sounds are positive, abdomen soft and non-tender without masses Extremities:   No clubbing, cyanosis or edema.  DP +1 Neuro: Alert and oriented X 3. Psych:  Good affect, responds appropriately   LABS: Basic Metabolic Panel:  Recent Labs  16/10/96 0936 09/25/12 1706 09/26/12 0435  NA 132*  --  134*  K 4.3  --  3.6  CL 97  --  95*  CO2 21  --  25  GLUCOSE 131*  --  164*  BUN 13  --  16  CREATININE 0.85 0.93 1.26*  CALCIUM 9.1  --  8.8   Liver Function Tests:  Recent Labs  09/25/12 0936 09/26/12 0435  AST 24 28  ALT 12 12  ALKPHOS 108 99  BILITOT 1.7* 1.2  PROT 7.4 7.2  ALBUMIN 3.0* 2.8*   No results found for this basename: LIPASE, AMYLASE,  in the last 72 hours CBC:  Recent Labs  09/25/12 0936 09/25/12 1706  WBC 14.0* 11.9*  NEUTROABS 13.0*  --   HGB 15.2* 14.5  HCT 44.7 43.3  MCV 90.5 89.8  PLT 135* 145*   Cardiac Enzymes:  Recent Labs  09/25/12 1706 09/25/12 2219 09/26/12 0435  CKTOTAL 25  --   --   CKMB 3.0  --   --   TROPONINI 0.42* 0.45* 0.60*   BNP: No components found with this basename: POCBNP,  D-Dimer: No  results found for this basename: DDIMER,  in the last 72 hours Hemoglobin A1C: No results found for this basename: HGBA1C,  in the last 72 hours Fasting Lipid Panel: No results found for this basename: CHOL, HDL, LDLCALC, TRIG, CHOLHDL, LDLDIRECT,  in the last 72 hours Thyroid Function Tests: No results found for this basename: TSH, T4TOTAL, FREET3, T3FREE, THYROIDAB,  in the last 72 hours Anemia Panel: No results found for this basename: VITAMINB12, FOLATE, FERRITIN, TIBC, IRON, RETICCTPCT,  in the last 72 hours Coag Panel:   Lab Results  Component Value Date   INR 1.22 09/26/2012   INR 1.03 09/01/2011    RADIOLOGY: Dg Chest 1 View  09/25/2012   CLINICAL DATA:  Shortness of breath, hemoptysis  EXAM: CHEST - 1 VIEW  COMPARISON:  Frontal view of the chest same day.  FINDINGS: Cardiomediastinal silhouette is stable. Again noted bilateral interstitial prominence which may be due to edema or atypical pneumonia. Bilateral small pleural effusion.  IMPRESSION: Again noted bilateral interstitial prominence which may be  due to edema or atypical pneumonia. Bilateral small pleural effusion.   Electronically Signed   By: Natasha Mead   On: 09/25/2012 16:00   Dg Ankle Complete Right  09/20/2012   *RADIOLOGY REPORT*  Clinical Data: Pain, swelling, redness at the anterior and lateral aspects of the ankle.  No known injury.  RIGHT ANKLE - COMPLETE 3+ VIEW  Comparison: None.  Findings: The osseous structures of the ankle are normal.  No appreciable arthritis.  There is soft tissue swelling around the ankle.  Small ankle effusion.  IMPRESSION: No osseous abnormality.  Soft tissue swelling and ankle effusion.   Original Report Authenticated By: Francene Boyers, M.D.   Dg Chest Port 1 View  09/25/2012   CLINICAL DATA:  42 year old female with chest pain radiating to the upper extremities with shortness of Breath nausea and diaphoresis.  EXAM: PORTABLE CHEST - 1 VIEW  COMPARISON:  08/28/2011 and earlier.  FINDINGS:  Portable AP semi upright view at 0942 hrs. Diffuse increased interstitial opacity, somewhat worse and the common confluent in the right lung. Lower lung volumes. No pneumothorax. Possible small effusions. Normal cardiac size and mediastinal contours. Sequelae of median sternotomy. Visualized tracheal air column is within normal limits.  IMPRESSION: Right greater than left diffuse increased interstitial opacity. Main differential considerations include acute asymmetric pulmonary edema and viral/atypical pneumonia. Small effusions suspected.   Electronically Signed   By: Augusto Gamble M.D.   On: 09/25/2012 10:25   ASSESSMENT:  1. Acute diastolic CHF - most likely secondary to endocarditis 2. Probable acute endocarditis of the MV prosthesis with echo today showing large vegetations on both leaflets of the bioprosthetic MV- History of endocarditis s/p MVR/TVR 06/2010 - Blood cultures pending 3. History of subarachnoid hemorrhage 06/2010  4. Post-op afib on Amio  5. Polysubstance abuse  7. CAD s/p remote CABG  6. ? PNA with elevated WBC, chills and chest xray concerning for PNA  7.  Elevated troponin most likely secondary to acute CHF PLAN:  1.  The patient is critically ill with evidence by echo of acute endocarditis which would explain night sweats, chills and leukocytosis.  Will arrange transfer to [facility] where she has her initial valve replacements done. 2. Continue IV diuretics      Quintella Reichert, MD  09/26/2012  10:42 AM

## 2012-09-27 NOTE — Progress Notes (Signed)
Utilization review completed.  

## 2012-09-29 LAB — CULTURE, BLOOD (ROUTINE X 2)

## 2012-10-04 LAB — CULTURE, BLOOD (ROUTINE X 2): Culture: NO GROWTH

## 2012-10-08 DIAGNOSIS — Z950 Presence of cardiac pacemaker: Secondary | ICD-10-CM

## 2012-10-08 HISTORY — PX: PACEMAKER PLACEMENT: SHX43

## 2012-10-08 HISTORY — DX: Presence of cardiac pacemaker: Z95.0

## 2012-11-16 ENCOUNTER — Inpatient Hospital Stay: Payer: Self-pay

## 2012-11-26 ENCOUNTER — Inpatient Hospital Stay: Payer: Self-pay

## 2013-02-02 ENCOUNTER — Emergency Department (HOSPITAL_COMMUNITY)
Admission: EM | Admit: 2013-02-02 | Discharge: 2013-02-02 | Disposition: A | Discharge status: Home / Self Care | Payer: Medicaid Other | Attending: Emergency Medicine | Admitting: Emergency Medicine

## 2013-02-02 ENCOUNTER — Encounter (HOSPITAL_COMMUNITY): Payer: Self-pay | Admitting: Emergency Medicine

## 2013-02-02 ENCOUNTER — Emergency Department (HOSPITAL_COMMUNITY): Payer: Medicaid Other

## 2013-02-02 DIAGNOSIS — E039 Hypothyroidism, unspecified: Secondary | ICD-10-CM | POA: Insufficient documentation

## 2013-02-02 DIAGNOSIS — F17200 Nicotine dependence, unspecified, uncomplicated: Secondary | ICD-10-CM | POA: Diagnosis not present

## 2013-02-02 DIAGNOSIS — R109 Unspecified abdominal pain: Secondary | ICD-10-CM

## 2013-02-02 DIAGNOSIS — Z950 Presence of cardiac pacemaker: Secondary | ICD-10-CM

## 2013-02-02 DIAGNOSIS — Z7982 Long term (current) use of aspirin: Secondary | ICD-10-CM | POA: Insufficient documentation

## 2013-02-02 DIAGNOSIS — Z8679 Personal history of other diseases of the circulatory system: Secondary | ICD-10-CM | POA: Insufficient documentation

## 2013-02-02 DIAGNOSIS — R1013 Epigastric pain: Secondary | ICD-10-CM | POA: Insufficient documentation

## 2013-02-02 DIAGNOSIS — Z8739 Personal history of other diseases of the musculoskeletal system and connective tissue: Secondary | ICD-10-CM | POA: Insufficient documentation

## 2013-02-02 DIAGNOSIS — Z79899 Other long term (current) drug therapy: Secondary | ICD-10-CM | POA: Insufficient documentation

## 2013-02-02 DIAGNOSIS — T82599A Other mechanical complication of unspecified cardiac and vascular devices and implants, initial encounter: Secondary | ICD-10-CM | POA: Diagnosis not present

## 2013-02-02 DIAGNOSIS — Z8619 Personal history of other infectious and parasitic diseases: Secondary | ICD-10-CM | POA: Insufficient documentation

## 2013-02-02 DIAGNOSIS — R1012 Left upper quadrant pain: Secondary | ICD-10-CM | POA: Diagnosis not present

## 2013-02-02 DIAGNOSIS — R000 Tachycardia, unspecified: Secondary | ICD-10-CM | POA: Diagnosis not present

## 2013-02-02 DIAGNOSIS — Y831 Surgical operation with implant of artificial internal device as the cause of abnormal reaction of the patient, or of later complication, without mention of misadventure at the time of the procedure: Secondary | ICD-10-CM | POA: Diagnosis not present

## 2013-02-02 DIAGNOSIS — R110 Nausea: Secondary | ICD-10-CM | POA: Diagnosis not present

## 2013-02-02 DIAGNOSIS — Z9889 Other specified postprocedural states: Secondary | ICD-10-CM | POA: Insufficient documentation

## 2013-02-02 DIAGNOSIS — Z951 Presence of aortocoronary bypass graft: Secondary | ICD-10-CM | POA: Diagnosis not present

## 2013-02-02 DIAGNOSIS — Z8673 Personal history of transient ischemic attack (TIA), and cerebral infarction without residual deficits: Secondary | ICD-10-CM | POA: Insufficient documentation

## 2013-02-02 DIAGNOSIS — F411 Generalized anxiety disorder: Secondary | ICD-10-CM | POA: Insufficient documentation

## 2013-02-02 DIAGNOSIS — Z862 Personal history of diseases of the blood and blood-forming organs and certain disorders involving the immune mechanism: Secondary | ICD-10-CM | POA: Diagnosis not present

## 2013-02-02 DIAGNOSIS — R1011 Right upper quadrant pain: Secondary | ICD-10-CM | POA: Diagnosis not present

## 2013-02-02 DIAGNOSIS — R0602 Shortness of breath: Secondary | ICD-10-CM | POA: Insufficient documentation

## 2013-02-02 DIAGNOSIS — R079 Chest pain, unspecified: Secondary | ICD-10-CM | POA: Insufficient documentation

## 2013-02-02 HISTORY — DX: Atrioventricular block, complete: I44.2

## 2013-02-02 HISTORY — DX: Unspecified atrial flutter: I48.92

## 2013-02-02 HISTORY — DX: Other chronic pain: G89.29

## 2013-02-02 HISTORY — DX: Sepsis, unspecified organism: A41.9

## 2013-02-02 HISTORY — DX: Patient's noncompliance with other medical treatment and regimen: Z91.19

## 2013-02-02 HISTORY — DX: Opioid abuse, uncomplicated: F11.10

## 2013-02-02 HISTORY — DX: Other psychoactive substance abuse, uncomplicated: F19.10

## 2013-02-02 HISTORY — DX: Patient's noncompliance with other medical treatment and regimen due to unspecified reason: Z91.199

## 2013-02-02 LAB — POCT I-STAT TROPONIN I: Troponin i, poc: 0.01 ng/mL (ref 0.00–0.08)

## 2013-02-02 LAB — BASIC METABOLIC PANEL
BUN: 13 mg/dL (ref 6–23)
CO2: 18 mEq/L — ABNORMAL LOW (ref 19–32)
Calcium: 9.6 mg/dL (ref 8.4–10.5)
Chloride: 96 mEq/L (ref 96–112)
Creatinine, Ser: 0.82 mg/dL (ref 0.50–1.10)
GFR calc Af Amer: >90 mL/min (ref >90)
GFR calc non Af Amer: 86 mL/min — ABNORMAL LOW (ref >90)
GLUCOSE: 100 mg/dL — AB (ref 70–99)
POTASSIUM: 4.3 meq/L (ref 3.7–5.3)
Sodium: 133 mEq/L — ABNORMAL LOW (ref 137–147)

## 2013-02-02 LAB — CBC
HEMATOCRIT: 42.8 % (ref 36.0–46.0)
HEMOGLOBIN: 14.5 g/dL (ref 12.0–15.0)
MCH: 27.6 pg (ref 26.0–34.0)
MCHC: 33.9 g/dL (ref 30.0–36.0)
MCV: 81.5 fL (ref 78.0–100.0)
Platelets: 279 10*3/uL (ref 150–400)
RBC: 5.25 MIL/uL — ABNORMAL HIGH (ref 3.87–5.11)
RDW: 17.5 % — ABNORMAL HIGH (ref 11.5–15.5)
WBC: 12.4 10*3/uL — ABNORMAL HIGH (ref 4.0–10.5)

## 2013-02-02 MED ORDER — SODIUM CHLORIDE 0.9 % IV BOLUS (SEPSIS)
1000.0000 mL | Freq: Once | INTRAVENOUS | Status: AC
  Administered 2013-02-02: 1000 mL via INTRAVENOUS

## 2013-02-02 MED ORDER — MORPHINE SULFATE 4 MG/ML IJ SOLN
4.0000 mg | Freq: Once | INTRAMUSCULAR | Status: AC
  Administered 2013-02-02: 4 mg via INTRAVENOUS
  Filled 2013-02-02: qty 1

## 2013-02-02 MED ORDER — TRAMADOL HCL 50 MG PO TABS: 50.0000 mg | ORAL_TABLET | Freq: Four times a day (QID) | ORAL | Status: DC | PRN

## 2013-02-02 MED ORDER — ONDANSETRON HCL 4 MG/2ML IJ SOLN
4.0000 mg | Freq: Once | INTRAMUSCULAR | Status: AC
  Administered 2013-02-02: 4 mg via INTRAVENOUS
  Filled 2013-02-02: qty 2

## 2013-02-02 MED ORDER — KETOROLAC TROMETHAMINE 30 MG/ML IJ SOLN
30.0000 mg | Freq: Once | INTRAMUSCULAR | Status: AC
  Administered 2013-02-02: 30 mg via INTRAVENOUS
  Filled 2013-02-02 (×2): qty 1

## 2013-02-02 NOTE — Consult Note (Signed)
Pt. Seen and examined. Agree with the NP/PA-C note as written. 43 yo female with history outlined above. 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 She has been complaining of "electric shocks" to the heart. Interrogation of the device shows normal function. She is hypotensive and complaining of abdominal pain for the past few days. She has also had diarrhea today. She ran out of her methadone - ?withdrawl. I would recommend medicine admission.  She may need abdominal imaging.  Thanks for consulting [place]. We will be available as needed.  Chrystie Nose, MD, [facility] Attending Cardiologist [facility]

## 2013-02-02 NOTE — ED Notes (Signed)
Pt primarily seen at [facility].  Pt has a pacemaker that is in her L abdominal cavity.  Pt states that last night she began having severe pain and "shock feelings".  Pt with extensive cardiac hx.

## 2013-02-02 NOTE — Discharge Instructions (Signed)
Abdominal Pain, Adult °Many things can cause abdominal pain. Usually, abdominal pain is not caused by a disease and will improve without treatment. It can often be observed and treated at home. Your health care provider will do a physical exam and possibly order blood tests and X-rays to help determine the seriousness of your pain. However, in many cases, more time must pass before a clear cause of the pain can be found. Before that point, your health care provider may not know if you need more testing or further treatment. °HOME CARE INSTRUCTIONS  °Monitor your abdominal pain for any changes. The following actions may help to alleviate any discomfort you are experiencing: °· Only take over-the-counter or prescription medicines as directed by your health care provider. °· Do not take laxatives unless directed to do so by your health care provider. °· Try a clear liquid diet (broth, tea, or water) as directed by your health care provider. Slowly move to a bland diet as tolerated. °SEEK MEDICAL CARE IF: °· You have unexplained abdominal pain. °· You have abdominal pain associated with nausea or diarrhea. °· You have pain when you urinate or have a bowel movement. °· You experience abdominal pain that wakes you in the night. °· You have abdominal pain that is worsened or improved by eating food. °· You have abdominal pain that is worsened with eating fatty foods. °SEEK IMMEDIATE MEDICAL CARE IF:  °· Your pain does not go away within 2 hours. °· You have a fever. °· You keep throwing up (vomiting). °· Your pain is felt only in portions of the abdomen, such as the right side or the left lower portion of the abdomen. °· You pass bloody or black tarry stools. °MAKE SURE YOU: °· Understand these instructions.   °· Will watch your condition.   °· Will get help right away if you are not doing well or get worse.   °Document Released: 10/09/2004 Document Revised: 10/20/2012 Document Reviewed: 09/08/2012 °Exit[employer]® Patient  Information ©2014 [employer]. ° °

## 2013-02-02 NOTE — ED Provider Notes (Signed)
Medical screening examination/treatment/procedure(s) were conducted as a shared visit with non-physician practitioner(s) and myself.  I personally evaluated the patient during the encounter.  EKG Interpretation    Date/Time:  Wednesday February 02 2013 13:07:09 EST Ventricular Rate:  115 PR Interval:  134 QRS Duration: 144 QT Interval:  372 QTC Calculation: 514 R Axis:   -110 Text Interpretation:  Electronic ventricular pacemaker Confirmed by POLLINA  MD, CHRISTOPHER ([id number]) on 02/02/2013 1:29:04 PM           Patient with previous significant cardiac history presents with chest pain. Patient is extremely noncompliant, has not followed up with any of her doctors. Primary care is performed at [facility]. Patient will require further evaluation and hospitalization secondary to her previous cardiac history.  Gilda Crease, MD 02/02/13 [phone]

## 2013-02-02 NOTE — ED Provider Notes (Signed)
CSN: [id number]     Arrival date & time 02/02/13  1301 History   First MD Initiated Contact with Patient 02/02/13 1325     Chief Complaint  Patient presents with  . Chest Pain  . Pacemaker Problem   (Consider location/radiation/quality/duration/timing/severity/associated sxs/prior Treatment) HPI Comments: Patient is a 43 year old female with past medical history of bacterial endocarditis, hepatitis C, hypothyroidism, stroke, anxiety, mental disorder and chronic pain who presents to the emergency department with concerns about her pacemaker not working right. Patient states 3 days ago she bent over and felt her pacemaker "twist", since then she has felt shocks up into her chest every 3-5 minutes. Pacemaker is located in her left upper abdomen, pacemaker unable to be placed on her heart because of scar tissue. 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 Patient never had her followup visits with the cardiologist at [facility], does not have a PCP, states she does not have insurance. Admits to associated nausea with one episode of vomiting earlier today. Admits to associated shortness of breath. Denies fever, chills, diarrhea. She is unsure what type of pacemaker she has.  Patient is a 43 y.o. female presenting with chest pain. The history is provided by the patient.  Chest Pain Associated symptoms: nausea and shortness of breath     Past Medical History  Diagnosis Date  . Bacterial endocarditis     s/p MVR and TVR at [facility] 06/2010; normal cors by cath 06/2010 at [facility]  . DJD (degenerative joint disease)   . Hepatitis C     history of  . Anemia   . Hypothyroidism   . Stroke   . Hepatitis C   . Anxiety   . Mental disorder   . PYYFRTMY(111.7)    Past Surgical History  Procedure Laterality Date  . Cholecystectomy    . Tonsillectomy and adenoidectomy    . Valve replacement    . Appendectomy    . Coronary artery bypass graft      [facility]  [place]  . Cardiac catheterization    . Cataract extraction    . Multiple extractions with alveoloplasty  09/01/2011    Procedure: MULTIPLE EXTRACION WITH ALVEOLOPLASTY;  Surgeon: Georgia Lopes, DDS;  Location: [facility] OR;  Service: Oral Surgery;  Laterality: N/A;   Family History  Problem Relation Age of Onset  . Cancer    . Coronary artery disease     History  Substance Use Topics  . Smoking status: Current Every Day Smoker -- 1.00 packs/day for 30 years    Types: Cigarettes  . Smokeless tobacco: Not on file     Comment: somkes about 1/2 pack a day  . Alcohol Use: No   OB History   Grav Para Term Preterm Abortions TAB SAB Ect Mult Living                 Review of Systems  Respiratory: Positive for shortness of breath.   Cardiovascular: Positive for chest pain.  Gastrointestinal: Positive for nausea.  All other systems reviewed and are negative.    Allergies  Review of patient's allergies indicates no known allergies.  Home Medications   Current Outpatient Rx  Name  Route  Sig  Dispense  Refill  . aspirin 81 MG chewable tablet   Oral   Chew 81 mg by mouth daily.         [name]  levothyroxine (SYNTHROID, LEVOTHROID) 75 MCG tablet   Oral   Take 75 mcg by mouth daily before breakfast.         . gabapentin (NEURONTIN) 600 MG tablet   Oral   Take 600 mg by mouth daily.          BP 115/82  Pulse 113  Temp(Src) 98.9 F (37.2 C) (Oral)  Resp 16  SpO2 100%  LMP 01/03/2013 Physical Exam  Nursing note and vitals reviewed. Constitutional: She is oriented to person, place, and time. She appears well-developed and well-nourished. No distress.  HENT:  Head: Normocephalic and atraumatic.  Mouth/Throat: Oropharynx is clear and moist.  Eyes: Conjunctivae are normal.  Neck: Normal range of motion. Neck supple.  Cardiovascular: Regular rhythm, normal heart sounds and intact distal pulses.  Tachycardia present.   Pulmonary/Chest: Effort  normal and breath sounds normal.  Abdominal: Soft. Normal appearance and bowel sounds are normal. She exhibits no distension. There is tenderness in the right upper quadrant, epigastric area and left upper quadrant.    No peritoneal signs.  Musculoskeletal: Normal range of motion. She exhibits no edema.  Neurological: She is alert and oriented to person, place, and time.  Skin: Skin is warm and dry. She is not diaphoretic.  Psychiatric: She has a normal mood and affect. Her behavior is normal.    ED Course  Procedures (including critical care time) Labs Review Labs Reviewed  CBC - Abnormal; Notable for the following:    WBC 12.4 (*)    RBC 5.25 (*)    RDW 17.5 (*)    All other components within normal limits  BASIC METABOLIC PANEL - Abnormal; Notable for the following:    Sodium 133 (*)    CO2 18 (*)    Glucose, Bld 100 (*)    GFR calc non Af Amer 86 (*)    All other components within normal limits  POCT I-STAT TROPONIN I   Imaging Review Dg Chest 2 View  02/02/2013   CLINICAL DATA:  Chest pain, short of breath  EXAM: CHEST  2 VIEW  COMPARISON:  DG CHEST 1 VIEW dated 09/25/2012; DG CHEST 1V PORT dated 09/25/2012  FINDINGS: Sternotomy wires overlie normal cardiac silhouette. Prosthetic valves are noted. There is mild linear atelectasis in the right lower lobe. Improvement in the pulmonary edema pattern seen on comparison exam. A pacemaker from an inferior approach.  IMPRESSION: 1. Mild platelike atelectasis in the right lower lobe. Cannot completely exclude infiltrate although less favored. 2. Improvement in pulmonary edema pattern following triple valve replacement.   Electronically Signed   By: Genevive Bi M.D.   On: 02/02/2013 15:22    EKG Interpretation    Date/Time:  Wednesday February 02 2013 13:07:09 EST Ventricular Rate:  115 PR Interval:  134 QRS Duration: 144 QT Interval:  372 QTC Calculation: 514 R Axis:   -110 Text Interpretation:  Electronic ventricular  pacemaker Confirmed by POLLINA  MD, CHRISTOPHER ([id number]) on 02/02/2013 1:29:04 PM            MDM   1. Chest pain   2. Cardiac pacemaker   3. Shortness of breath     Pt presenting with CP, sob, concern of pacemaker firing shocks from her abdomen. She appears in NAD, VSS. She is not knowledgeable about her pacemaker, never had f/u after her surgery for valve replacements. Does not have a PCP. EKG showing electronic ventricular paced rhythm. Leukocytosis of 12.4, troponin negative. Pacemaker palpable in LUQ of abdomen.  No peritoneal signs. Pacemaker interrogated. Given pt's significant cardiac history and surgeries, pacemaker in abdomen due to cardiac scar tissue, I spoke with Trish, cardiology PA who will have pt evaluated for admission. Case discussed with attending Dr. Blinda LeatherwoodPollina who also evaluated patient and agrees with plan of care.   Trevor MaceRobyn M Albert, PA-C 02/02/13 1553

## 2013-02-02 NOTE — Consult Note (Signed)
History and Physical  Patient ID: [name] MRN: [id number], DOB: 03/11/1970 Date of Encounter: 02/02/2013, 6:41 PM Primary Physician: Provider Not In System Primary Cardiologist: Does not regularly see one now. Prev seen by Eden Emms here, was admitted 09/2012 at [facility] Complaint: abdominal pain, not sleeping well, something wrong with my pacemaker  HPI: [name] is a 43 y/o F with history of endocarditis s/p MVR/TVR, chronic opioid abuse, polysubstance abuse including IVDA, hepatitis C, hypothyroidism, and noncompliance who presented to [facility] with concerns that her pacemaker is not working right. 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 Course was complicated by atrial flutter post-op and compliance with followup was spotty per notes. [facility] indicate she was recently admitted 09/2012-11/12/12 with hemoptysis and SOB following injection of IV opioid analgesic pills. 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 Underwent echocardiogram on initial admission which demonstrated masses on bioprosthetic mitral valve leaflets with significantly increased MVR and TVR gradients (full details below), concerning for endocarditis in the setting of sepsis and IVDU. Blood cultures from OSH obtained on 9/13 demonstrated strep viridans (2/2). She was started on ceftriaxone, azithromycin, vancomycin, gentamycin and rifampin at the OSH. This was converted to vancomycin and subsequently ceftriaxone once OSH susceptibilities were obtained. TEE on 9/16 demonstrated large, extensive obstructive vegetations on the MV, as well as vegetations on the tricuspid valve, and large abscess extending from anterior mitral  leaflet to the LA wall and posterior peri-aortic wall. She underwent re-do mitral valve replacement, tricuspid valve replacement, and aortic valve replacement (all bioprosthetic) on 09/26/2012 with Dr. Nathaniel Man. Notes also indicate she underwent reconstruction of SVC with a bovine patch. Her post-operative course was complicated by AKI, retinitis felt due to the bacteria, AMS, lice, and CHB. She underwent temporary-permanent pacemaker placement on 9/22, and subsequently Medtronic epicardial permanent pacemaker placement on 9/26. Due to concerns over discharge with indwelling IV line for prolonged antibiotic administration in the setting of chronic IVDU, patient remained hospitalized to complete her course of ceftriaxone which completed on 10/31.  She has not followed up with anyone since her discharge. She states she doesn't have a cardiologist and was waiting for Medicaid to kick in. She reports abdominal discomfort in her abdomen since pacemaker was placed. She felt like she was being shocked by a 9V battery last night. She has not been sleeping well. SOB waxes and wanes chronically. Had nausea this morning and just wasn't feeling well. Has chronic chest pain around her incision site, no radiation or exertional component. No syncope, fever, orthopnea, cough, LEE. Does endorse occasional hot flashes. Still using street narcotics and marijuana that she gets "here and there." Denies any further IV drug use. Has a lot of social stress. Pacemaker functioning normally by interrogation. Lead trends within expected range. No arrhythmias noted. No diagnostic observations noted. BPs in the 70s-80s in the ER.  Past Medical History  Diagnosis Date  . Bacterial endocarditis     a. s/p MVR and TVR at [facility] 06/2010 secondary to endocarditis related to IVDA; normal cors by cath 06/2010 at [facility]. b. re-do mitral valve replacement, tricuspid valve replacement, and aortic valve replacement (all bioprosthetic).  [name] DJD  (degenerative joint disease)   . Hepatitis C  history of  . Anemia   . Hypothyroidism   . Stroke   . Hepatitis C   . Anxiety   . Mental disorder   . Chronic pain   . Noncompliance   . Polysubstance abuse   . Sepsis     a. 05/2010 with sepsis, vegegations on valve replacements, ARDS, VDRF, multiorgan failure with elevated cardiac enzymes, suffered a left fronto-parietal subarachnoid hemorrhage, pulmonary infarcts, ARF requiring brief hemodialysis. s/p perciardial MVR/TVR.  [name] Atrial flutter     a. Post-op from valve replacement 06/2010.  [name] CHB (complete heart block)     a. After valve replacement 09/2012 - pacer placement.  . Opiate abuse, episodic      Most Recent Cardiac Studies: Pacemaker 10/08/12 OPERATIVE REPORT: The patient's chest and abdomen were prepped and draped in a sterile fashion. A time out was performed. The previous median sternotomy was reopened. The two previously placed epicardial leads were retested and again found to have too high a threshold for pacing. They were removed.. Two epicardial leads were eventually placed on the diaphragmatic surface as attempts anywhere on the RV free wall were not associated with low enough thresholds. The RV threshold was 0.5. A steroid eluding bilead atrial pacing lead was sewn onto the right atrium. The threshold was 2.5. These leads were capped and then tunneled to a right upper quadrant pocket. The leads were connected to the Medtronic Adapta [id number]. The second RV lead was placed under the box. The patient was paced in a DDD mode at 90. The sternum and pacemaker pocket was closed in a routine fashion.    Studies Transthoracic echocardiogram (09/27/2012) INTERPRETATION --------------------------------------------------------------- NORMAL LEFT VENTRICULAR SYSTOLIC FUNCTION WITH MILD LVH NORMAL RIGHT VENTRICULAR SYSTOLIC FUNCTION VALVULAR REGURGITATION: MODERATE AR, MODERATE TR PROSTHETIC VALVE(S): BIOPROSTHETIC MV, BIOPROSTHETIC  TV TEE SUGGESTED IF CLINICALLY INDICATED 3D acquisition and reconstructions were performed as part of this examination to more accurately quantify the effects of a cardiac mass either known or found as part of the exam. Compared with prior Echo study on 12/09/2010: NEW LARGE MASSES SEEN ON MV LEAFLETS. MASSES ON BIOPROSTHETIC MITRAL VALVE LEAFLETS; ONE OF THE MASSES ON MV MEASURES 2.9cm X 1.7cm. MEAN GRADIENT ACROSS BIO-MV IS 28 mmHG. MVR AND TVR GRADIENTS SIGNIFICANTLY INCREASED. TV LEAFLETS NOT WELL SEEN; TR ATLEAST MODERATE   Transesophageal Echocardiogram (09/28/2012) CONCLUSIONS ------------------------------------------------------------------ -- Large, extensive obstructive vegetations encasing the entirety of both leaflets of the mitral valve causing mild MR and a mean gradient of (Encasement depth of vegetation is 1.6cm. The vegetation is contiguous with adjacent abscess so no discrete length) -- Vegetations seen on the bioprosthetic tricuspid valve with severe TR. TV area by 3D 1.02cm^2, TR EROA by 3D 0.21cm^2 -- Vegetation extending from prosthetic mitral valve ring and prolapsing into left atrial appendage(LAA), -- At least mild AR, though no vegetations on the aortic valve leaflets. --Posterior peri-aortic plus LA wall thickening contiguous with anterior mitral leaflet vegetation consistent with large abscess. -- Normal LV& RV function    Surgical History:  Past Surgical History  Procedure Laterality Date  . Cholecystectomy    . Tonsillectomy and adenoidectomy    . Valve replacement    . Appendectomy    . Coronary artery bypass graft      2012    [facility] Je[facility]  [place]  . Cardiac catheterization    . Cataract extraction    . Multiple extractions with alveoloplasty  09/01/2011  Procedure: MULTIPLE EXTRACION WITH ALVEOLOPLASTY;  Surgeon: Georgia Lopes, DDS;  Location: [facility] OR;  Service: Oral Surgery;  Laterality: N/A;     Home Meds: Prior to  Admission medications   Medication Sig Start Date End Date Taking? Authorizing Provider  aspirin 81 MG chewable tablet Chew 81 mg by mouth daily.   Yes Historical Provider, MD  levothyroxine (SYNTHROID, LEVOTHROID) 75 MCG tablet Take 75 mcg by mouth daily before breakfast.   Yes Historical Provider, MD  gabapentin (NEURONTIN) 600 MG tablet Take 600 mg by mouth daily.    Historical Provider, MD    Allergies: No Known Allergies  History   Social History  . Marital Status: Legally Separated    Spouse Name: N/A    Number of Children: N/A  . Years of Education: N/A   Occupational History  . Not on file.   Social History Main Topics  . Smoking status: Current Every Day Smoker -- 1.00 packs/day for 30 years    Types: Cigarettes  . Smokeless tobacco: Not on file     Comment: 1 pack every 3 days - smoking vapor cigs   . Alcohol Use: No  . Drug Use: Yes    Special: Marijuana, Cocaine     Comment: prior IVDA, cocaine. + occasional narcotics  . Sexual Activity: Not on file   Other Topics Concern  . Not on file   Social History Narrative  . No narrative on file     Family History  Problem Relation Age of Onset  . Cancer    . Coronary artery disease      Review of Systems: All other systems reviewed and are otherwise negative except as noted above.  Labs:   Lab Results  Component Value Date   WBC 12.4* 02/02/2013   HGB 14.5 02/02/2013   HCT 42.8 02/02/2013   MCV 81.5 02/02/2013   PLT 279 02/02/2013     Recent Labs Lab 02/02/13 1334  NA 133*  K 4.3  CL 96  CO2 18*  BUN 13  CREATININE 0.82  CALCIUM 9.6  GLUCOSE 100*   Troponin neg x 1  Radiology/Studies:  Dg Chest 2 View 02/02/2013   CLINICAL DATA:  Chest pain, short of breath  EXAM: CHEST  2 VIEW  COMPARISON:  DG CHEST 1 VIEW dated 09/25/2012; DG CHEST 1V PORT dated 09/25/2012  FINDINGS: Sternotomy wires overlie normal cardiac silhouette. Prosthetic valves are noted. There is mild linear atelectasis in the right  lower lobe. Improvement in the pulmonary edema pattern seen on comparison exam. A pacemaker from an inferior approach.  IMPRESSION: 1. Mild platelike atelectasis in the right lower lobe. Cannot completely exclude infiltrate although less favored. 2. Improvement in pulmonary edema pattern following triple valve replacement.   Electronically Signed   By: Genevive Bi M.D.   On: 02/02/2013 15:22    EKG: sinus tach 115bpm V paced  Physical Exam Blood pressure 93/48, pulse 96, temperature 98.1 F (36.7 C), temperature source Oral, resp. rate 18, last menstrual period 01/03/2013, SpO2 97.00%. General: Well developed thin WF in no acute distress. Head: Normocephalic, atraumatic, sclera non-icteric, no xanthomas, nares are without discharge.  Neck:  JVD not elevated. Lungs: Clear bilaterally to auscultation without wheezes, rales, or rhonchi. Breathing is unlabored. Heart: RRR with S1 S2. Minimal SEM. No rubs rubs or gallops appreciated. Sternal scar without dehiscence. Abdomen: Soft, non-tender, non-distended with normoactive bowel sounds. No hepatomegaly. No rebound/guarding. No obvious abdominal masses.Epicardial pacemaker. Incision well healed. Mild peri-pacemaker  tenderness but no obvious abnormality or hematoma. No ecchymosis Msk:  Strength and tone appear normal for age. Extremities: No clubbing or cyanosis. No edema.  Distal pedal pulses are 2+ and equal bilaterally. Neuro: Alert and oriented X 3. No focal deficit. No facial asymmetry. Moves all extremities spontaneously. Psych:  Responds to questions appropriately with a normal affect.    ASSESSMENT AND PLAN:   1. Abdominal pain/nausea 2. Peri-pacemaker team with sensation of shock last night, but no evidence of pacemaker malfunction by interrogation (h/o CHB following valve surgery 09/2012) 3. Bacterial endocarditis s/p bioprosthetic valve replacement x 3 in Sept 2014 4. Polysubstance abuse with ongoing narcotic abuse, prior IVDA 5.  Untreated hepatitis C 6. History of anxiety and mental disorder 7. Noncompliance  Pacemaker interrogation reviewed by Dr. Rennis GoldenHilty and no evidence of malfunction. Symptoms are not clearly cardiac in nature. Recommend medicine admission to further evaluate abdominal pain in setting of leukocytosis. We will follow with you. Compliance with followup was encouraged. She should follow up with Dr. Eden EmmsNishan in our office. I have left a message on our office's scheduling voicemail requesting a follow-up appointment, and our office will call the patient with this appointment.  Signed, Ronie Spiesayna Dunn PA-C 02/02/2013, 6:41 PM

## 2013-02-02 NOTE — Discharge Planning (Signed)
[facility] Tivis Ringer, Community Liaison  Follow up appointment was made for Thursday Feb 17, 2013 at 12:00 with the [facility] to establish primary care. Patient does have medicaid pending in the system. My contact information given.

## 2013-02-02 NOTE — ED Notes (Signed)
Spoke with [employer] interrogator and pt does have a [employer] Pacemaker

## 2013-02-06 ENCOUNTER — Inpatient Hospital Stay (HOSPITAL_COMMUNITY)
Admission: EM | Admit: 2013-02-06 | Discharge: 2013-02-14 | DRG: 314 | Disposition: A | Discharge status: Home Health | Payer: Medicaid Other | Attending: Internal Medicine | Admitting: Internal Medicine

## 2013-02-06 ENCOUNTER — Encounter (HOSPITAL_COMMUNITY): Payer: Self-pay | Admitting: Emergency Medicine

## 2013-02-06 ENCOUNTER — Emergency Department (HOSPITAL_COMMUNITY): Payer: Medicaid Other

## 2013-02-06 DIAGNOSIS — Z91199 Patient's noncompliance with other medical treatment and regimen due to unspecified reason: Secondary | ICD-10-CM

## 2013-02-06 DIAGNOSIS — I272 Pulmonary hypertension, unspecified: Secondary | ICD-10-CM | POA: Diagnosis present

## 2013-02-06 DIAGNOSIS — G894 Chronic pain syndrome: Secondary | ICD-10-CM | POA: Diagnosis present

## 2013-02-06 DIAGNOSIS — A419 Sepsis, unspecified organism: Secondary | ICD-10-CM | POA: Diagnosis present

## 2013-02-06 DIAGNOSIS — I38 Endocarditis, valve unspecified: Secondary | ICD-10-CM

## 2013-02-06 DIAGNOSIS — Z79899 Other long term (current) drug therapy: Secondary | ICD-10-CM

## 2013-02-06 DIAGNOSIS — R519 Headache, unspecified: Secondary | ICD-10-CM | POA: Diagnosis present

## 2013-02-06 DIAGNOSIS — R51 Headache: Secondary | ICD-10-CM

## 2013-02-06 DIAGNOSIS — G969 Disorder of central nervous system, unspecified: Secondary | ICD-10-CM

## 2013-02-06 DIAGNOSIS — J80 Acute respiratory distress syndrome: Secondary | ICD-10-CM

## 2013-02-06 DIAGNOSIS — I5031 Acute diastolic (congestive) heart failure: Secondary | ICD-10-CM

## 2013-02-06 DIAGNOSIS — Z950 Presence of cardiac pacemaker: Secondary | ICD-10-CM | POA: Diagnosis present

## 2013-02-06 DIAGNOSIS — F1910 Other psychoactive substance abuse, uncomplicated: Secondary | ICD-10-CM | POA: Diagnosis present

## 2013-02-06 DIAGNOSIS — T826XXA Infection and inflammatory reaction due to cardiac valve prosthesis, initial encounter: Secondary | ICD-10-CM

## 2013-02-06 DIAGNOSIS — Z951 Presence of aortocoronary bypass graft: Secondary | ICD-10-CM

## 2013-02-06 DIAGNOSIS — I079 Rheumatic tricuspid valve disease, unspecified: Secondary | ICD-10-CM | POA: Diagnosis present

## 2013-02-06 DIAGNOSIS — I071 Rheumatic tricuspid insufficiency: Secondary | ICD-10-CM | POA: Diagnosis present

## 2013-02-06 DIAGNOSIS — R0789 Other chest pain: Secondary | ICD-10-CM | POA: Diagnosis present

## 2013-02-06 DIAGNOSIS — R6510 Systemic inflammatory response syndrome (SIRS) of non-infectious origin without acute organ dysfunction: Secondary | ICD-10-CM | POA: Diagnosis present

## 2013-02-06 DIAGNOSIS — B182 Chronic viral hepatitis C: Secondary | ICD-10-CM

## 2013-02-06 DIAGNOSIS — R7881 Bacteremia: Secondary | ICD-10-CM | POA: Diagnosis present

## 2013-02-06 DIAGNOSIS — Z953 Presence of xenogenic heart valve: Secondary | ICD-10-CM

## 2013-02-06 DIAGNOSIS — Z7982 Long term (current) use of aspirin: Secondary | ICD-10-CM

## 2013-02-06 DIAGNOSIS — R195 Other fecal abnormalities: Secondary | ICD-10-CM | POA: Diagnosis present

## 2013-02-06 DIAGNOSIS — B1920 Unspecified viral hepatitis C without hepatic coma: Secondary | ICD-10-CM

## 2013-02-06 DIAGNOSIS — F411 Generalized anxiety disorder: Secondary | ICD-10-CM | POA: Diagnosis present

## 2013-02-06 DIAGNOSIS — Z23 Encounter for immunization: Secondary | ICD-10-CM

## 2013-02-06 DIAGNOSIS — T827XXA Infection and inflammatory reaction due to other cardiac and vascular devices, implants and grafts, initial encounter: Principal | ICD-10-CM | POA: Diagnosis present

## 2013-02-06 DIAGNOSIS — A409 Streptococcal sepsis, unspecified: Secondary | ICD-10-CM | POA: Diagnosis present

## 2013-02-06 DIAGNOSIS — I959 Hypotension, unspecified: Secondary | ICD-10-CM | POA: Diagnosis present

## 2013-02-06 DIAGNOSIS — I330 Acute and subacute infective endocarditis: Secondary | ICD-10-CM

## 2013-02-06 DIAGNOSIS — I270 Primary pulmonary hypertension: Secondary | ICD-10-CM | POA: Diagnosis present

## 2013-02-06 DIAGNOSIS — E876 Hypokalemia: Secondary | ICD-10-CM | POA: Diagnosis present

## 2013-02-06 DIAGNOSIS — T8201XA Breakdown (mechanical) of heart valve prosthesis, initial encounter: Secondary | ICD-10-CM | POA: Diagnosis present

## 2013-02-06 DIAGNOSIS — Z8673 Personal history of transient ischemic attack (TIA), and cerebral infarction without residual deficits: Secondary | ICD-10-CM

## 2013-02-06 DIAGNOSIS — F1210 Cannabis abuse, uncomplicated: Secondary | ICD-10-CM | POA: Diagnosis present

## 2013-02-06 DIAGNOSIS — Y831 Surgical operation with implant of artificial internal device as the cause of abnormal reaction of the patient, or of later complication, without mention of misadventure at the time of the procedure: Secondary | ICD-10-CM | POA: Diagnosis present

## 2013-02-06 DIAGNOSIS — E039 Hypothyroidism, unspecified: Secondary | ICD-10-CM | POA: Diagnosis present

## 2013-02-06 DIAGNOSIS — D649 Anemia, unspecified: Secondary | ICD-10-CM | POA: Diagnosis present

## 2013-02-06 DIAGNOSIS — E860 Dehydration: Secondary | ICD-10-CM | POA: Diagnosis present

## 2013-02-06 DIAGNOSIS — I442 Atrioventricular block, complete: Secondary | ICD-10-CM | POA: Diagnosis present

## 2013-02-06 DIAGNOSIS — Z8679 Personal history of other diseases of the circulatory system: Secondary | ICD-10-CM

## 2013-02-06 DIAGNOSIS — B952 Enterococcus as the cause of diseases classified elsewhere: Secondary | ICD-10-CM

## 2013-02-06 DIAGNOSIS — I4892 Unspecified atrial flutter: Secondary | ICD-10-CM

## 2013-02-06 DIAGNOSIS — Z9119 Patient's noncompliance with other medical treatment and regimen: Secondary | ICD-10-CM

## 2013-02-06 DIAGNOSIS — F17200 Nicotine dependence, unspecified, uncomplicated: Secondary | ICD-10-CM | POA: Diagnosis present

## 2013-02-06 HISTORY — DX: Endocarditis, valve unspecified: I38

## 2013-02-06 HISTORY — DX: Infection and inflammatory reaction due to cardiac valve prosthesis, initial encounter: T82.6XXA

## 2013-02-06 HISTORY — DX: Presence of xenogenic heart valve: Z95.3

## 2013-02-06 HISTORY — DX: Occlusion and stenosis of unspecified cerebral artery: I66.9

## 2013-02-06 HISTORY — DX: Septic arterial embolism: I76

## 2013-02-06 HISTORY — DX: Presence of cardiac pacemaker: Z95.0

## 2013-02-06 LAB — URINALYSIS, ROUTINE W REFLEX MICROSCOPIC
BILIRUBIN URINE: NEGATIVE
GLUCOSE, UA: NEGATIVE mg/dL
Ketones, ur: NEGATIVE mg/dL
LEUKOCYTES UA: NEGATIVE
NITRITE: NEGATIVE
PH: 6 (ref 5.0–8.0)
Protein, ur: NEGATIVE mg/dL
SPECIFIC GRAVITY, URINE: 1.015 (ref 1.005–1.030)
Urobilinogen, UA: 0.2 mg/dL (ref 0.0–1.0)

## 2013-02-06 LAB — COMPREHENSIVE METABOLIC PANEL
ALT: 7 U/L (ref 0–35)
AST: 12 U/L (ref 0–37)
Albumin: 3.4 g/dL — ABNORMAL LOW (ref 3.5–5.2)
Alkaline Phosphatase: 93 U/L (ref 39–117)
BUN: 10 mg/dL (ref 6–23)
CO2: 19 meq/L (ref 19–32)
Calcium: 8.6 mg/dL (ref 8.4–10.5)
Chloride: 97 mEq/L (ref 96–112)
Creatinine, Ser: 0.81 mg/dL (ref 0.50–1.10)
GFR calc Af Amer: >90 mL/min (ref >90)
GFR calc non Af Amer: 87 mL/min — ABNORMAL LOW (ref >90)
Glucose, Bld: 79 mg/dL (ref 70–99)
Potassium: 3.2 mEq/L — ABNORMAL LOW (ref 3.7–5.3)
SODIUM: 132 meq/L — AB (ref 137–147)
Total Bilirubin: 0.8 mg/dL (ref 0.3–1.2)
Total Protein: 8.1 g/dL (ref 6.0–8.3)

## 2013-02-06 LAB — CBC WITH DIFFERENTIAL/PLATELET
BASOS ABS: 0 10*3/uL (ref 0.0–0.1)
Basophils Relative: 0 % (ref 0–1)
Eosinophils Absolute: 0 10*3/uL (ref 0.0–0.7)
Eosinophils Relative: 0 % (ref 0–5)
HCT: 33.2 % — ABNORMAL LOW (ref 36.0–46.0)
Hemoglobin: 11.3 g/dL — ABNORMAL LOW (ref 12.0–15.0)
LYMPHS PCT: 9 % — AB (ref 12–46)
Lymphs Abs: 0.8 10*3/uL (ref 0.7–4.0)
MCH: 26.7 pg (ref 26.0–34.0)
MCHC: 34 g/dL (ref 30.0–36.0)
MCV: 78.5 fL (ref 78.0–100.0)
Monocytes Absolute: 0.9 10*3/uL (ref 0.1–1.0)
Monocytes Relative: 10 % (ref 3–12)
NEUTROS PCT: 82 % — AB (ref 43–77)
Neutro Abs: 7.5 10*3/uL (ref 1.7–7.7)
PLATELETS: 151 10*3/uL (ref 150–400)
RBC: 4.23 MIL/uL (ref 3.87–5.11)
RDW: 17.4 % — ABNORMAL HIGH (ref 11.5–15.5)
WBC: 9.2 10*3/uL (ref 4.0–10.5)

## 2013-02-06 LAB — URINE MICROSCOPIC-ADD ON

## 2013-02-06 LAB — TROPONIN I: Troponin I: <0.3 ng/mL (ref <0.30)

## 2013-02-06 LAB — PRO B NATRIURETIC PEPTIDE: PRO B NATRI PEPTIDE: 1450 pg/mL — AB (ref 0–125)

## 2013-02-06 LAB — LACTIC ACID, PLASMA: LACTIC ACID, VENOUS: 1.2 mmol/L (ref 0.5–2.2)

## 2013-02-06 LAB — PROTIME-INR
INR: 1.23 (ref 0.00–1.49)
Prothrombin Time: 15.2 seconds (ref 11.6–15.2)

## 2013-02-06 LAB — FIBRINOGEN: Fibrinogen: 463 mg/dL (ref 204–475)

## 2013-02-06 MED ORDER — DIPHENHYDRAMINE HCL 50 MG/ML IJ SOLN
25.0000 mg | Freq: Once | INTRAMUSCULAR | Status: AC
  Administered 2013-02-06: 25 mg via INTRAVENOUS
  Filled 2013-02-06: qty 1

## 2013-02-06 MED ORDER — DIPHENHYDRAMINE HCL 50 MG/ML IJ SOLN
25.0000 mg | Freq: Once | INTRAMUSCULAR | Status: AC
  Administered 2013-02-06: 25 mg via INTRAVENOUS

## 2013-02-06 MED ORDER — ACETAMINOPHEN 325 MG PO TABS
650.0000 mg | ORAL_TABLET | Freq: Once | ORAL | Status: AC
  Administered 2013-02-06: 650 mg via ORAL
  Filled 2013-02-06: qty 2

## 2013-02-06 MED ORDER — SODIUM CHLORIDE 0.9 % IV BOLUS (SEPSIS)
500.0000 mL | Freq: Once | INTRAVENOUS | Status: AC
  Administered 2013-02-06: 500 mL via INTRAVENOUS

## 2013-02-06 MED ORDER — METOCLOPRAMIDE HCL 5 MG/ML IJ SOLN
10.0000 mg | Freq: Once | INTRAMUSCULAR | Status: AC
  Administered 2013-02-06: 10 mg via INTRAVENOUS
  Filled 2013-02-06: qty 2

## 2013-02-06 MED ORDER — VANCOMYCIN HCL 10 G IV SOLR
1500.0000 mg | Freq: Once | INTRAVENOUS | Status: AC
  Administered 2013-02-06: 1500 mg via INTRAVENOUS
  Filled 2013-02-06: qty 1500

## 2013-02-06 MED ORDER — DIPHENHYDRAMINE HCL 50 MG/ML IJ SOLN
INTRAMUSCULAR | Status: AC
  Filled 2013-02-06: qty 1

## 2013-02-06 MED ORDER — PIPERACILLIN-TAZOBACTAM 3.375 G IVPB 30 MIN
3.3750 g | Freq: Once | INTRAVENOUS | Status: AC
  Administered 2013-02-06: 3.375 g via INTRAVENOUS
  Filled 2013-02-06: qty 50

## 2013-02-06 NOTE — ED Notes (Signed)
ED EMT, made RN aware that pt was having some redness and around IV site and ithcing to the site.  MD made aware

## 2013-02-06 NOTE — ED Provider Notes (Signed)
CSN: [id number]     Arrival date & time 02/06/13  1914 History   First MD Initiated Contact with Patient 02/06/13 1916     Chief Complaint  Patient presents with  . Chest Pain    HPI: Ms. 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 She was seen in our ED four days ago for possible pacemaker malfunction, her pacemaker was interrogated and noted to be functioning appropriately. Since returning home she has had worsening pain between her shoulder blades, described as aching, radiates to her left shoulder, worse with movement and walking, relieved partially with laying still. She has also had chest pain described as "shocking, like if you put your tongue on a 9V battery." Associated with SOB, chills, decreased appetite and malaise. She also complains of pain localized to her LUQ where her pacemaker is located. Pain on arrival is 10/10. Noted to be hypotensive and febrile on arrival.    Past Medical History  Diagnosis Date  . Bacterial endocarditis     a. s/p MVR and TVR at [facility] 06/2010 secondary to endocarditis related to IVDA; normal cors by cath 06/2010 at [facility]. b. re-do mitral valve replacement, tricuspid valve replacement, and aortic valve replacement (all bioprosthetic).  [name] DJD (degenerative joint disease)   . Hepatitis C     history of  . Anemia   . Hypothyroidism   . Stroke   . Anxiety   . Mental disorder   . Chronic pain   . Noncompliance   . Polysubstance abuse   . Sepsis     a. 05/2010 with sepsis, vegegations on valve replacements, ARDS, VDRF, multiorgan failure with elevated cardiac enzymes, suffered a left fronto-parietal subarachnoid hemorrhage, pulmonary infarcts, ARF requiring brief hemodialysis. s/p perciardial MVR/TVR.  [name] Atrial flutter     a. Post-op from valve replacement 06/2010.  [name] CHB (complete heart block)     a. After valve replacement 09/2012 - pacer placement.  . Opiate abuse, episodic    Past  Surgical History  Procedure Laterality Date  . Cholecystectomy    . Tonsillectomy and adenoidectomy    . Valve replacement    . Appendectomy    . Coronary artery bypass graft      2012    [facility]          [place] Jeannetta Nap  [facility]  . Cardiac catheterization    . Cataract extraction    . Multiple extractions with alveoloplasty  09/01/2011    Procedure: MULTIPLE EXTRACION WITH ALVEOLOPLASTY;  Surgeon: Georgia Lopes, DDS;  Location: [facility] OR;  Service: Oral Surgery;  Laterality: N/A;   Family History  Problem Relation Age of Onset  . Cancer    . Coronary artery disease     History  Substance Use Topics  . Smoking status: Current Every Day Smoker -- 1.00 packs/day for 30 years    Types: Cigarettes  . Smokeless tobacco: Not on file     Comment: 1 pack every 3 days - smoking vapor cigs   . Alcohol Use: No   OB History   Grav Para Term Preterm Abortions TAB SAB Ect Mult Living                 Review of Systems  Constitutional: Positive for fever (measured here today), chills, activity change (decreased), appetite change (decreased) and fatigue.  Eyes: Negative for photophobia and visual disturbance.  Respiratory: Positive for shortness of breath.  Negative for cough.   Cardiovascular: Positive for chest pain. Negative for leg swelling.  Gastrointestinal: Positive for nausea. Negative for vomiting, abdominal pain, diarrhea and constipation.  Genitourinary: Negative for dysuria, frequency and decreased urine volume.  Musculoskeletal: Positive for back pain. Negative for arthralgias, gait problem and myalgias.  Skin: Negative for color change and wound.  Neurological: Negative for dizziness, syncope, light-headedness and headaches.  Psychiatric/Behavioral: Negative for confusion and agitation.  All other systems reviewed and are negative.    Allergies  Review of patient's allergies indicates no known allergies.  Home Medications   Current Outpatient Rx  Name  Route  Sig  Dispense   Refill  . acetaminophen (TYLENOL) 500 MG tablet   Oral   Take 1,000 mg by mouth every 6 (six) hours as needed for moderate pain.          [name]. aspirin 81 MG chewable tablet   Oral   Chew 81 mg by mouth daily.         [name]. levothyroxine (SYNTHROID, LEVOTHROID) 75 MCG tablet   Oral   Take 75 mcg by mouth daily before breakfast.          BP 93/57  Pulse 97  Temp(Src) 100.8 F (38.2 C) (Oral)  Resp 23  SpO2 98%  LMP 01/03/2013 Physical Exam  Nursing note and vitals reviewed. Constitutional: She is oriented to person, place, and time. No distress.  Chronically ill appearing female, laying in bed, appears uncomfortable.   HENT:  Head: Normocephalic and atraumatic.  Mouth/Throat: Mucous membranes are dry.  Eyes: Conjunctivae and EOM are normal. Pupils are equal, round, and reactive to light.  Neck: Normal range of motion. Neck supple.  Cardiovascular: Regular rhythm and intact distal pulses.  Tachycardia present.   Murmur heard.  Systolic murmur is present with a grade of 2/6  Pulmonary/Chest: Effort normal and breath sounds normal. No respiratory distress.  Abdominal: Soft. Bowel sounds are normal. There is tenderness (mild without guarding. ) in the left upper quadrant. There is no rebound and no guarding.  Musculoskeletal: Normal range of motion. She exhibits no edema and no tenderness.  Neurological: She is alert and oriented to person, place, and time. No cranial nerve deficit. Coordination normal.  Skin: Skin is warm and dry. No rash noted.  Psychiatric: She has a normal mood and affect. Her behavior is normal.    ED Course  Procedures (including critical care time) Labs Review Labs Reviewed  CULTURE, BLOOD (ROUTINE X 2)  CULTURE, BLOOD (ROUTINE X 2)  URINE CULTURE  CBC WITH DIFFERENTIAL  COMPREHENSIVE METABOLIC PANEL  INFLUENZA PANEL BY PCR (TYPE A & B, H1N1)  URINALYSIS, ROUTINE W REFLEX MICROSCOPIC  LACTIC ACID, PLASMA  TROPONIN I  PRO B NATRIURETIC PEPTIDE    Imaging Review No results found.  EKG Interpretation    Date/Time:  Sunday February 06 2013 19:59:53 EST Ventricular Rate:  93 PR Interval:  158 QRS Duration: 153 QT Interval:  398 QTC Calculation: 495 R Axis:   -36 Text Interpretation:  Atrial-sensed ventricular-paced rhythm No further analysis attempted due to paced rhythm Confirmed by DOCHERTY  MD, MEGAN [phone]) on 02/06/2013 8:05:02 PM            MDM  43 yo F with history of MVR and TVR secondary to endocarditis from IVD, who presents with malaise, chest pain and chills. Febrile to 100.8, associated tachycardia. Chronically ill appearing but in no distress. Given fever and history of recurrent endocarditis, sepsis work up initiated.  Doubt PNA as CXR is clear, no significant cough. Low suspicion for ACS as initial troponin negative, ECG without acute ischemic changes. Lactic acid is 1.2, K 3.2, Na 132, CO2 19, otherwise normal lytes and cr. Interestingly her Hgb is 11.3, which is down from 11.5 four days ago. Concern for DIC so fibrinogen added which was normal, making DIC less likely. UA without infection. She was hypotensive which only minimally improved with IVF's. She was started on empiric antibiotics given fever, tachycardia and tachypnea. I do not have an obvious source of infection for the patient. Given her history and persistent hypotension she was admitted to the Hospitalist service. I spoke to the patient about her diagnosis and need for admission, she was in agreement with plan.   Reviewed imaging, labs, ECG and previous medical records, utilized in MDM  Discussed case with Dr. Micheline Maze.   Clinical Impression 1. SIRS without source of infection 2. Hypotension 3. Acute anemia, unknown cause    Margie Billet, MD 02/08/13 [phone]

## 2013-02-06 NOTE — ED Notes (Signed)
Per EMS: pt coming from with c/o pacemaker firing since the 21st. Pacemaker was placed at [facility]. Pt c/o headache, pain between her shoulders with no radiation and nausea. Pt given 4 mg zofran. Pt states she took a 81 mg asa today. Pt is A&Ox4, respirations equal and unlabored, skin slightly diaphoretic and warm.

## 2013-02-06 NOTE — ED Notes (Signed)
MD at bedside. 

## 2013-02-07 ENCOUNTER — Encounter (HOSPITAL_COMMUNITY): Payer: Self-pay | Admitting: Radiology

## 2013-02-07 ENCOUNTER — Inpatient Hospital Stay (HOSPITAL_COMMUNITY): Payer: Medicaid Other

## 2013-02-07 DIAGNOSIS — F17200 Nicotine dependence, unspecified, uncomplicated: Secondary | ICD-10-CM | POA: Diagnosis present

## 2013-02-07 DIAGNOSIS — I330 Acute and subacute infective endocarditis: Secondary | ICD-10-CM | POA: Diagnosis present

## 2013-02-07 DIAGNOSIS — E860 Dehydration: Secondary | ICD-10-CM | POA: Diagnosis present

## 2013-02-07 DIAGNOSIS — Z79899 Other long term (current) drug therapy: Secondary | ICD-10-CM | POA: Diagnosis not present

## 2013-02-07 DIAGNOSIS — I359 Nonrheumatic aortic valve disorder, unspecified: Secondary | ICD-10-CM

## 2013-02-07 DIAGNOSIS — Z950 Presence of cardiac pacemaker: Secondary | ICD-10-CM | POA: Diagnosis not present

## 2013-02-07 DIAGNOSIS — Y831 Surgical operation with implant of artificial internal device as the cause of abnormal reaction of the patient, or of later complication, without mention of misadventure at the time of the procedure: Secondary | ICD-10-CM | POA: Diagnosis present

## 2013-02-07 DIAGNOSIS — E039 Hypothyroidism, unspecified: Secondary | ICD-10-CM

## 2013-02-07 DIAGNOSIS — R6510 Systemic inflammatory response syndrome (SIRS) of non-infectious origin without acute organ dysfunction: Secondary | ICD-10-CM | POA: Diagnosis present

## 2013-02-07 DIAGNOSIS — A419 Sepsis, unspecified organism: Secondary | ICD-10-CM | POA: Diagnosis present

## 2013-02-07 DIAGNOSIS — I270 Primary pulmonary hypertension: Secondary | ICD-10-CM | POA: Diagnosis present

## 2013-02-07 DIAGNOSIS — R5383 Other fatigue: Secondary | ICD-10-CM | POA: Diagnosis present

## 2013-02-07 DIAGNOSIS — R195 Other fecal abnormalities: Secondary | ICD-10-CM | POA: Diagnosis present

## 2013-02-07 DIAGNOSIS — Z91199 Patient's noncompliance with other medical treatment and regimen due to unspecified reason: Secondary | ICD-10-CM | POA: Diagnosis not present

## 2013-02-07 DIAGNOSIS — B1920 Unspecified viral hepatitis C without hepatic coma: Secondary | ICD-10-CM | POA: Diagnosis present

## 2013-02-07 DIAGNOSIS — I079 Rheumatic tricuspid valve disease, unspecified: Secondary | ICD-10-CM | POA: Diagnosis present

## 2013-02-07 DIAGNOSIS — Z7982 Long term (current) use of aspirin: Secondary | ICD-10-CM | POA: Diagnosis not present

## 2013-02-07 DIAGNOSIS — E876 Hypokalemia: Secondary | ICD-10-CM | POA: Diagnosis present

## 2013-02-07 DIAGNOSIS — D649 Anemia, unspecified: Secondary | ICD-10-CM

## 2013-02-07 DIAGNOSIS — I959 Hypotension, unspecified: Secondary | ICD-10-CM | POA: Diagnosis present

## 2013-02-07 DIAGNOSIS — I272 Pulmonary hypertension, unspecified: Secondary | ICD-10-CM | POA: Diagnosis present

## 2013-02-07 DIAGNOSIS — F1210 Cannabis abuse, uncomplicated: Secondary | ICD-10-CM | POA: Diagnosis present

## 2013-02-07 DIAGNOSIS — A409 Streptococcal sepsis, unspecified: Secondary | ICD-10-CM | POA: Diagnosis present

## 2013-02-07 DIAGNOSIS — R5381 Other malaise: Secondary | ICD-10-CM | POA: Diagnosis present

## 2013-02-07 DIAGNOSIS — R0789 Other chest pain: Secondary | ICD-10-CM | POA: Diagnosis present

## 2013-02-07 DIAGNOSIS — Z8673 Personal history of transient ischemic attack (TIA), and cerebral infarction without residual deficits: Secondary | ICD-10-CM | POA: Diagnosis not present

## 2013-02-07 DIAGNOSIS — Z8679 Personal history of other diseases of the circulatory system: Secondary | ICD-10-CM

## 2013-02-07 DIAGNOSIS — G894 Chronic pain syndrome: Secondary | ICD-10-CM | POA: Diagnosis present

## 2013-02-07 DIAGNOSIS — T827XXA Infection and inflammatory reaction due to other cardiac and vascular devices, implants and grafts, initial encounter: Secondary | ICD-10-CM | POA: Diagnosis present

## 2013-02-07 DIAGNOSIS — Z951 Presence of aortocoronary bypass graft: Secondary | ICD-10-CM | POA: Diagnosis not present

## 2013-02-07 DIAGNOSIS — F411 Generalized anxiety disorder: Secondary | ICD-10-CM | POA: Diagnosis present

## 2013-02-07 DIAGNOSIS — Z23 Encounter for immunization: Secondary | ICD-10-CM | POA: Diagnosis not present

## 2013-02-07 LAB — CORTISOL: CORTISOL PLASMA: 14 ug/dL

## 2013-02-07 LAB — CBC WITH DIFFERENTIAL/PLATELET
BASOS ABS: 0 10*3/uL (ref 0.0–0.1)
BASOS PCT: 0 % (ref 0–1)
EOS ABS: 0 10*3/uL (ref 0.0–0.7)
Eosinophils Relative: 0 % (ref 0–5)
HCT: 30.7 % — ABNORMAL LOW (ref 36.0–46.0)
Hemoglobin: 10.2 g/dL — ABNORMAL LOW (ref 12.0–15.0)
Lymphocytes Relative: 13 % (ref 12–46)
Lymphs Abs: 0.9 10*3/uL (ref 0.7–4.0)
MCH: 26.3 pg (ref 26.0–34.0)
MCHC: 33.2 g/dL (ref 30.0–36.0)
MCV: 79.1 fL (ref 78.0–100.0)
MONO ABS: 0.7 10*3/uL (ref 0.1–1.0)
MONOS PCT: 11 % (ref 3–12)
NEUTROS PCT: 76 % (ref 43–77)
Neutro Abs: 5.3 10*3/uL (ref 1.7–7.7)
Platelets: 128 10*3/uL — ABNORMAL LOW (ref 150–400)
RBC: 3.88 MIL/uL (ref 3.87–5.11)
RDW: 17.5 % — AB (ref 11.5–15.5)
WBC: 7 10*3/uL (ref 4.0–10.5)

## 2013-02-07 LAB — COMPREHENSIVE METABOLIC PANEL
ALBUMIN: 2.9 g/dL — AB (ref 3.5–5.2)
ALT: 6 U/L (ref 0–35)
AST: 10 U/L (ref 0–37)
Alkaline Phosphatase: 87 U/L (ref 39–117)
BILIRUBIN TOTAL: 0.9 mg/dL (ref 0.3–1.2)
BUN: 9 mg/dL (ref 6–23)
CHLORIDE: 105 meq/L (ref 96–112)
CO2: 16 mEq/L — ABNORMAL LOW (ref 19–32)
CREATININE: 0.65 mg/dL (ref 0.50–1.10)
Calcium: 7.8 mg/dL — ABNORMAL LOW (ref 8.4–10.5)
GFR calc Af Amer: >90 mL/min (ref >90)
GFR calc non Af Amer: >90 mL/min (ref >90)
Glucose, Bld: 85 mg/dL (ref 70–99)
POTASSIUM: 3.3 meq/L — AB (ref 3.7–5.3)
Sodium: 138 mEq/L (ref 137–147)
TOTAL PROTEIN: 7.4 g/dL (ref 6.0–8.3)

## 2013-02-07 LAB — TSH: TSH: 25.123 u[IU]/mL — AB (ref 0.350–4.500)

## 2013-02-07 LAB — INFLUENZA PANEL BY PCR (TYPE A & B)
H1N1 flu by pcr: NOT DETECTED
Influenza A By PCR: NEGATIVE
Influenza B By PCR: NEGATIVE

## 2013-02-07 LAB — TYPE AND SCREEN
ABO/RH(D): B POS
Antibody Screen: NEGATIVE

## 2013-02-07 LAB — LACTATE DEHYDROGENASE: LDH: 180 U/L (ref 94–250)

## 2013-02-07 LAB — T4, FREE: FREE T4: 1.1 ng/dL (ref 0.80–1.80)

## 2013-02-07 LAB — FERRITIN: Ferritin: 80 ng/mL (ref 10–291)

## 2013-02-07 LAB — RAPID URINE DRUG SCREEN, HOSP PERFORMED
AMPHETAMINES: NOT DETECTED
BARBITURATES: NOT DETECTED
Benzodiazepines: NOT DETECTED
Cocaine: NOT DETECTED
Opiates: NOT DETECTED
Tetrahydrocannabinol: POSITIVE — AB

## 2013-02-07 LAB — TROPONIN I
Troponin I: <0.3 ng/mL (ref <0.30)
Troponin I: <0.3 ng/mL (ref <0.30)

## 2013-02-07 LAB — LIPASE, BLOOD: LIPASE: 16 U/L (ref 11–59)

## 2013-02-07 LAB — IRON AND TIBC
Iron: 18 ug/dL — ABNORMAL LOW (ref 42–135)
Saturation Ratios: 6 % — ABNORMAL LOW (ref 20–55)
TIBC: 294 ug/dL (ref 250–470)
UIBC: 276 ug/dL (ref 125–400)

## 2013-02-07 LAB — OCCULT BLOOD, POC DEVICE: FECAL OCCULT BLD: POSITIVE — AB

## 2013-02-07 LAB — RETICULOCYTES
RBC.: 3.84 MIL/uL — AB (ref 3.87–5.11)
RETIC COUNT ABSOLUTE: 38.4 10*3/uL (ref 19.0–186.0)
Retic Ct Pct: 1 % (ref 0.4–3.1)

## 2013-02-07 LAB — SEDIMENTATION RATE: Sed Rate: 50 mm/hr — ABNORMAL HIGH (ref 0–22)

## 2013-02-07 LAB — FOLATE: FOLATE: 14.7 ng/mL

## 2013-02-07 LAB — PREGNANCY, URINE: Preg Test, Ur: NEGATIVE

## 2013-02-07 LAB — VITAMIN B12: Vitamin B-12: 355 pg/mL (ref 211–911)

## 2013-02-07 LAB — ABO/RH: ABO/RH(D): B POS

## 2013-02-07 LAB — PROCALCITONIN

## 2013-02-07 LAB — HAPTOGLOBIN: Haptoglobin: 260 mg/dL — ABNORMAL HIGH (ref 45–215)

## 2013-02-07 LAB — MRSA PCR SCREENING: MRSA BY PCR: NEGATIVE

## 2013-02-07 MED ORDER — OXYCODONE-ACETAMINOPHEN 5-325 MG PO TABS
1.0000 | ORAL_TABLET | ORAL | Status: DC | PRN
  Administered 2013-02-08 – 2013-02-09 (×7): 1 via ORAL
  Filled 2013-02-07 (×7): qty 1

## 2013-02-07 MED ORDER — CYANOCOBALAMIN 1000 MCG/ML IJ SOLN
1000.0000 ug | Freq: Once | INTRAMUSCULAR | Status: AC
  Administered 2013-02-07: 1000 ug via SUBCUTANEOUS
  Filled 2013-02-07: qty 1

## 2013-02-07 MED ORDER — IOHEXOL 350 MG/ML SOLN
100.0000 mL | Freq: Once | INTRAVENOUS | Status: AC | PRN
  Administered 2013-02-07: 100 mL via INTRAVENOUS

## 2013-02-07 MED ORDER — TRAMADOL HCL 50 MG PO TABS
50.0000 mg | ORAL_TABLET | Freq: Four times a day (QID) | ORAL | Status: DC | PRN
  Administered 2013-02-07 – 2013-02-13 (×10): 50 mg via ORAL
  Filled 2013-02-07 (×10): qty 1

## 2013-02-07 MED ORDER — HYDROMORPHONE HCL PF 1 MG/ML IJ SOLN
1.0000 mg | Freq: Once | INTRAMUSCULAR | Status: AC
  Administered 2013-02-07: 1 mg via INTRAVENOUS
  Filled 2013-02-07: qty 1

## 2013-02-07 MED ORDER — PNEUMOCOCCAL 13-VAL CONJ VACC IM SUSP: 0.5000 mL | INTRAMUSCULAR | Status: DC

## 2013-02-07 MED ORDER — ONDANSETRON HCL 4 MG/2ML IJ SOLN
4.0000 mg | Freq: Four times a day (QID) | INTRAMUSCULAR | Status: DC | PRN
Start: 2013-02-07 — End: 2013-02-14
  Administered 2013-02-07 – 2013-02-14 (×3): 4 mg via INTRAVENOUS
  Filled 2013-02-07 (×3): qty 2

## 2013-02-07 MED ORDER — POTASSIUM CHLORIDE CRYS ER 20 MEQ PO TBCR
40.0000 meq | EXTENDED_RELEASE_TABLET | Freq: Once | ORAL | Status: AC
  Administered 2013-02-07: 40 meq via ORAL
  Filled 2013-02-07: qty 2

## 2013-02-07 MED ORDER — SODIUM CHLORIDE 0.9 % IV SOLN
300.0000 mg | Freq: Three times a day (TID) | INTRAVENOUS | Status: DC
  Administered 2013-02-07 – 2013-02-08 (×2): 300 mg via INTRAVENOUS
  Filled 2013-02-07 (×4): qty 300

## 2013-02-07 MED ORDER — SODIUM CHLORIDE 0.9 % IJ SOLN
3.0000 mL | Freq: Two times a day (BID) | INTRAMUSCULAR | Status: DC
  Administered 2013-02-07 – 2013-02-13 (×8): 3 mL via INTRAVENOUS

## 2013-02-07 MED ORDER — VANCOMYCIN HCL IN DEXTROSE 1-5 GM/200ML-% IV SOLN
1000.0000 mg | Freq: Two times a day (BID) | INTRAVENOUS | Status: DC
  Filled 2013-02-07: qty 200

## 2013-02-07 MED ORDER — SODIUM CHLORIDE 0.9 % IV SOLN
INTRAVENOUS | Status: AC
  Administered 2013-02-07: 1000 mL via INTRAVENOUS
  Administered 2013-02-07: 16:00:00 via INTRAVENOUS

## 2013-02-07 MED ORDER — KETOROLAC TROMETHAMINE 30 MG/ML IJ SOLN
30.0000 mg | Freq: Once | INTRAMUSCULAR | Status: AC
  Administered 2013-02-07: 30 mg via INTRAVENOUS
  Filled 2013-02-07: qty 1

## 2013-02-07 MED ORDER — OXYCODONE-ACETAMINOPHEN 5-325 MG PO TABS
2.0000 | ORAL_TABLET | Freq: Once | ORAL | Status: AC
  Administered 2013-02-07: 2 via ORAL
  Filled 2013-02-07: qty 2

## 2013-02-07 MED ORDER — ACETAMINOPHEN 650 MG RE SUPP: 650.0000 mg | Freq: Four times a day (QID) | RECTAL | Status: DC | PRN

## 2013-02-07 MED ORDER — ACETAMINOPHEN 325 MG PO TABS
650.0000 mg | ORAL_TABLET | Freq: Four times a day (QID) | ORAL | Status: DC | PRN
  Administered 2013-02-07 – 2013-02-10 (×4): 650 mg via ORAL
  Filled 2013-02-07 (×5): qty 2

## 2013-02-07 MED ORDER — VANCOMYCIN HCL IN DEXTROSE 1-5 GM/200ML-% IV SOLN
1000.0000 mg | Freq: Three times a day (TID) | INTRAVENOUS | Status: DC
  Administered 2013-02-07 – 2013-02-09 (×5): 1000 mg via INTRAVENOUS
  Filled 2013-02-07 (×7): qty 200

## 2013-02-07 MED ORDER — PANTOPRAZOLE SODIUM 40 MG PO TBEC
40.0000 mg | DELAYED_RELEASE_TABLET | Freq: Every day | ORAL | Status: DC
  Administered 2013-02-07 – 2013-02-09 (×3): 40 mg via ORAL
  Filled 2013-02-07 (×3): qty 1

## 2013-02-07 MED ORDER — ONDANSETRON HCL 4 MG PO TABS
4.0000 mg | ORAL_TABLET | Freq: Four times a day (QID) | ORAL | Status: DC | PRN
  Administered 2013-02-08 – 2013-02-11 (×5): 4 mg via ORAL
  Filled 2013-02-07 (×5): qty 1

## 2013-02-07 MED ORDER — PIPERACILLIN-TAZOBACTAM 3.375 G IVPB
3.3750 g | Freq: Three times a day (TID) | INTRAVENOUS | Status: DC
  Filled 2013-02-07 (×3): qty 50

## 2013-02-07 MED ORDER — INFLUENZA VAC SPLIT QUAD 0.5 ML IM SUSP
0.5000 mL | INTRAMUSCULAR | Status: AC
Start: 2013-02-08 — End: 2013-02-08
  Administered 2013-02-08: 0.5 mL via INTRAMUSCULAR
  Filled 2013-02-07: qty 0.5

## 2013-02-07 MED ORDER — LEVOTHYROXINE SODIUM 75 MCG PO TABS
75.0000 ug | ORAL_TABLET | Freq: Every day | ORAL | Status: DC
  Administered 2013-02-07 – 2013-02-14 (×8): 75 ug via ORAL
  Filled 2013-02-07 (×10): qty 1

## 2013-02-07 MED ORDER — ALUM & MAG HYDROXIDE-SIMETH 200-200-20 MG/5ML PO SUSP: 30.0000 mL | ORAL | Status: DC | PRN

## 2013-02-07 MED ORDER — GENTAMICIN IN SALINE 1.2-0.9 MG/ML-% IV SOLN
60.0000 mg | Freq: Three times a day (TID) | INTRAVENOUS | Status: DC
  Administered 2013-02-07 – 2013-02-14 (×20): 60 mg via INTRAVENOUS
  Filled 2013-02-07 (×24): qty 50

## 2013-02-07 MED ORDER — SODIUM CHLORIDE 0.9 % IV SOLN
Freq: Once | INTRAVENOUS | Status: AC
Start: 2013-02-07 — End: 2013-02-11
  Administered 2013-02-07: 1000 mL via INTRAVENOUS

## 2013-02-07 NOTE — Progress Notes (Addendum)
Positive blood culture results obtained from samples taken on 02/06/2013.  Anaerobic on first set resulted in gram (+) cocci in pairs.  Aerobic on second set resulted in gram (+) cocci in pairs.  Junious Silk, NP paged and notified of results.

## 2013-02-07 NOTE — Progress Notes (Signed)
  Echocardiogram 2D Echocardiogram has been performed.  Georgian Co 02/07/2013, 4:14 PM

## 2013-02-07 NOTE — Progress Notes (Signed)
Patient complains of pain in the left chest/shoulder/axilla area (rate 8/10).  Described as constant, heartburn type sensation that does not radiate.  Pt BP is 93/61, HR is V paced at 75, O2 sat 96% on room air.  Patient appears calm and relaxed at this time.  Dr Sharon Seller paged and notified of this finding.

## 2013-02-07 NOTE — Progress Notes (Signed)
Pt has been experiencing chest pain in the left shoulder/axilla region since the shift assessment this morning.  Pt is now rating that pain at 9.5/10 with radiation to the neck and across the chest to the right.  Junious Silk, NP paged and notified of this finding.  Will continue to monitor.

## 2013-02-07 NOTE — Progress Notes (Signed)
ANTIBIOTIC CONSULT NOTE - INITIAL  Pharmacy Consult for Vancomycin/Zosyn Indication: rule out sepsis  No Known Allergies  Patient Measurements: 76.6kg  Vital Signs: Temp: 99.1 F (37.3 C) (01/25 2151) Temp src: Oral (01/25 2151) BP: 98/59 mmHg (01/26 0143) Pulse Rate: 78 (01/26 0143)  Labs:  Recent Labs  02/06/13 1950  WBC 9.2  HGB 11.3*  PLT 151  CREATININE 0.81   Medical History: Past Medical History  Diagnosis Date  . Bacterial endocarditis     a. s/p MVR and TVR at [facility] 06/2010 secondary to endocarditis related to IVDA; normal cors by cath 06/2010 at [facility]. b. re-do mitral valve replacement, tricuspid valve replacement, and aortic valve replacement (all bioprosthetic).  [name] DJD (degenerative joint disease)   . Hepatitis C     history of  . Anemia   . Hypothyroidism   . Stroke   . Anxiety   . Mental disorder   . Chronic pain   . Noncompliance   . Polysubstance abuse   . Sepsis     a. 05/2010 with sepsis, vegegations on valve replacements, ARDS, VDRF, multiorgan failure with elevated cardiac enzymes, suffered a left fronto-parietal subarachnoid hemorrhage, pulmonary infarcts, ARF requiring brief hemodialysis. s/p perciardial MVR/TVR.  [name] Atrial flutter     a. Post-op from valve replacement 06/2010.  [name] CHB (complete heart block)     a. After valve replacement 09/2012 - pacer placement.  . Opiate abuse, episodic    Assessment: 42 y/o F with h/o on of endocarditis with valve replacement here with weakness and chest pain, to start broad spectrum antibiotic for r/o sepsis. WBC 9.2, renal function ok, hypotensive, Tmax 100.8, other labs as above.   ED Antibiotics Vancomycin 1500 mg IV x 1 at 0040 Zosyn 3.375g IV x 1 at 2229 (1/25)  Goal of Therapy:  Vancomycin trough level 15-20 mcg/ml  Plan:  -Vancomycin 1000 mg IV q12h -Zosyn 3.375G IV q8h to be infused over 4 hours -Trend WBC, temp, renal function  -F/U cultures, imaging -Drug levels as indicated  Abran Duke 02/07/2013,3:31 AM

## 2013-02-07 NOTE — ED Notes (Signed)
MD at bedside. 

## 2013-02-07 NOTE — Plan of Care (Signed)
K low so repleted orally.  Junious Silk, ANP

## 2013-02-07 NOTE — Progress Notes (Signed)
[facility]  [name] ZOX:[id number] DOB: 03-Oct-1970 DOA: 02/06/2013 PCP: Provider Not In System  Brief narrative: 43 year old female patient with history of recurrent gram + cocci endocarditis. She previously had undergone bioprosthetic mitral tricuspid valve replacements but had recurrent endocarditis in Sept 2014 and subsequently underwent redo mitral and tricuspid valve replacements and also aortic valve replacement all with bioprosthetic devices. During that [facility] ([facility]) she developed complete heart block and a pacemaker was placed.   She presented to the emergency department because of weakness and chest pain as well as dizziness. States had been ongoing for 4 days and felt like shocks. In the emergency department the patient's pacemaker was interrogated and was functioning appropriately.  After arrival to the ER she was also found to be hypotensive with systolic blood pressures in the 80s. 3 days prior patient has had blood work checked in the ER and now her hemoglobin had decreased by 3 g. Stool for occult blood was positive without any frank rectal bleeding or melena seen on rectal exam per ER physician. She subsequently received 1 L of normal saline. Because of the hypotension a sepsis workup was initiated so urine and blood cultures were obtained and influenza PCR was sent off. She was empirically started on vancomycin and Zosyn. Cardiologist was also consulted.  HPI/Subjective: Patient alert and continuing to complain of constant midsternal chest discomfort. No apparent melena. No reported hematemesis. No shortness of breath  Assessment/Plan:  SIRS  -No obvious source of infection thus far therefore no indication to continue empiric antibiotics so we'll discontinue -Followup on cultures obtained in the ER -Complaints of generalized malaise and myalgias likely related to inadequately treated hypothyroidism but as precaution check  full respiratory viral panel  Dehydration -Continue IV fluids and boluses when necessary  Chest pain, non-cardiac -Appreciate Cardiology assistance -Normal coronaries per cath 2012 therefore no additional ischemic evaluation indicated -Given heme-positive anemia concerning for GI etiology -When necessary Mylanta and Ultram -Begin PPI (see below)  Pulmonary HTN - 62 mmHg -Noted on echo prior to recent valve replacement  Hypotension -Suspect combination of volume depletion, recent progression of anemia -Cortisol 14  Anemia/Heme positive stool -Hemoglobin has decreased from 13 to 10 with previous readings in the fall of 2014 much higher in between 14 and 15 -Check anemia panel - no evidence of large volume gross blood loss   Hypokalemia -Oral replete  Hypothyroidism -TSH 25.123 - suspect noncompliance w/ medications  -Check free T4 and T3  Substance abuse  -UDS pos for THC only  History of bacterial endocarditis / s/p recurrent bioprosthetic valve replacements -Followup on blood cultures and if positive likely would need to repeat TEE  DVT prophylaxis: SCDs Code Status: Full Family Communication: no family at bedside Disposition Plan/Expected LOS: Step down  Consultants: Cardiology  Procedures: None  Antibiotics: Zosyn 1/25 >>> 1/26 Vancomycin 1/25 >>> 1/26  Objective: Blood pressure 103/66, pulse 80, temperature 97.9 F (36.6 C), temperature source Oral, resp. rate 20, height 5\' 4"  (1.626 m), weight 169 lb 1.5 oz (76.7 kg), last menstrual period 02/05/2013, SpO2 100.00%.  Intake/Output Summary (Last 24 hours) at 02/07/13 1250 Last data filed at 02/07/13 1201  Gross per 24 hour  Intake   1431 ml  Output   1550 ml  Net   -119 ml   Exam: Followup exam completed. Patient admitted today at 2:39 AM.  Scheduled Meds:  Scheduled Meds: . [START ON 02/08/2013] influenza vac split  quadrivalent PF  0.5 mL Intramuscular Tomorrow-1000  . levothyroxine  75 mcg Oral  QAC breakfast  . pantoprazole  40 mg Oral Daily  . sodium chloride  3 mL Intravenous Q12H   Data Reviewed: Basic Metabolic Panel:  Recent Labs Lab 02/02/13 1334 02/06/13 1950 02/07/13 0350  NA 133* 132* 138  K 4.3 3.2* 3.3*  CL 96 97 105  CO2 18* 19 16*  GLUCOSE 100* 79 85  BUN 13 10 9   CREATININE 0.82 0.81 0.65  CALCIUM 9.6 8.6 7.8*   Liver Function Tests:  Recent Labs Lab 02/06/13 1950 02/07/13 0350  AST 12 10  ALT 7 6  ALKPHOS 93 87  BILITOT 0.8 0.9  PROT 8.1 7.4  ALBUMIN 3.4* 2.9*    Recent Labs Lab 02/07/13 0350  LIPASE 16   CBC:  Recent Labs Lab 02/02/13 1334 02/06/13 1950 02/07/13 0350  WBC 12.4* 9.2 7.0  NEUTROABS  --  7.5 5.3  HGB 14.5 11.3* 10.2*  HCT 42.8 33.2* 30.7*  MCV 81.5 78.5 79.1  PLT 279 151 128*   Cardiac Enzymes:  Recent Labs Lab 02/06/13 1951 02/07/13 0350 02/07/13 1002  TROPONINI <0.30 <0.30 <0.30   BNP (last 3 results)  Recent Labs  09/25/12 0936 02/06/13 1951  PROBNP 6304.0* 1450.0*    Recent Results (from the past 240 hour(s))  MRSA PCR SCREENING     Status: None   Collection Time    02/07/13  2:28 AM      Result Value Range Status   MRSA by PCR NEGATIVE  NEGATIVE Final   Comment:            The GeneXpert MRSA Assay (FDA     approved for NASAL specimens     only), is one component of a     comprehensive MRSA colonization     surveillance program. It is not     intended to diagnose MRSA     infection nor to guide or     monitor treatment for     MRSA infections.     Studies:  Recent x-ray studies have been reviewed in detail by the Attending Physician  Time spent :     Junious Silk, ANP [facility] Office  [phone] Pager [phone]  **If unable to reach the above provider after paging please contact the Flow Manager @ [phone]  On-Call/Text Page:      [email or web address]      password [email or web address]  If 7PM-7AM, please contact night-coverage [email or web address] Password [email or web address] 02/07/2013, 12:50  PM   LOS: 1 day   I have personally examined this patient and reviewed the entire database. I have reviewed the above note, made any necessary editorial changes, and agree with its content.  Lonia Blood, MD [facility]

## 2013-02-07 NOTE — H&P (Addendum)
History and Physical  Patient ID: AMA MCMASTER MRN: [id number], DOB: November 12, 1970 Date of Encounter: 02/07/2013, 5:34 AM Primary Physician: Provider Not In System Primary Cardiologist: Charlton Haws   Chief Complaint: headache  Reason for Admission: possible sepsis / anemia   HPI: 43 yr old female with hx of HCV and polysubstance abuse. She had mitral valve and tricuspid valve replacement( pericardial tissue valves)  on 07/05/10 by Dr. Silvestre Mesi at [facility] secondary to MSSA endocarditis ( normal cors by cath 06/2010 at [facility] stay at the time was complicated by [facility] , AKI requiring brief hemodialysis, post op Aflutter requiring amiodarone . Per notes pt thereafter had a redo AVR, and MVR ( bioprostehtic valves) and PPM. She initially came to the ER 3 days ago with symptoms of sharp ' battery shock ' in the center of her chest . Her PPM was interrogated at the time and deemed to be functioning appropriately. Since then pt continued to have symptoms of headaches , lighheade ness, bilateral shoulder aches , diffuse bloating abd pain. In the Er pt was found to be hypotensive with SBP in 80's , Temp 100.8 and Hg decrease of 14.5--> 10.2. For this reason pt was admitted to the hospital    Past Medical History  Diagnosis Date  . Bacterial endocarditis     a. 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 b. re-do mitral valve replacement, tricuspid valve replacement, and aortic valve replacement (all bioprosthetic).  [name] DJD (degenerative joint disease)   . Hepatitis C     history of  . Anemia   . Hypothyroidism   . Stroke   . Anxiety   . Mental disorder   . Chronic pain   . Noncompliance   . Polysubstance abuse   . Sepsis     a. 05/2010 with sepsis, vegegations on valve replacements, ARDS, VDRF, multiorgan failure with elevated cardiac enzymes, suffered a left fronto-parietal subarachnoid hemorrhage, pulmonary infarcts, ARF requiring  brief hemodialysis. s/p perciardial MVR/TVR.  [name] Atrial flutter     a. Post-op from valve replacement 06/2010.  [name] CHB (complete heart block)     a. After valve replacement 09/2012 - pacer placement.  . Opiate abuse, episodic      Most Recent Cardiac Studies:  Echo 09/2012  - Left ventricle: The cavity size was normal. There was mild concentric hypertrophy. Systolic function was normal. The estimated ejection fraction was in the range of 55% to 60%. Wall motion was normal; there were no regional wall motion abnormalities. - Aortic valve: A bioprosthesis was present. Mild regurgitation. - Mitral valve: A bioprosthesis was present. There was an apparent, large vegetation on the left ventricular aspect; the appearance is consistent with vegetation. Valve area by pressure half-time: 0.68cm^2. - Left atrium: The atrium was mildly dilated. - Atrial septum: No defect or patent foramen ovale was identified. - Tricuspid valve: Moderate regurgitation. - Pulmonary arteries: PA peak pressure: 62mm Hg (S). Impressions:  - The right ventricular systolic pressure was increased consistent with moderate pulmonary hypertension.     Surgical History:  Past Surgical History  Procedure Laterality Date  . Cholecystectomy    . Tonsillectomy and adenoidectomy    . Valve replacement    . Appendectomy    . Coronary artery bypass graft      [place]    [facility]          [place] [facility]  .  Cardiac catheterization    . Cataract extraction    . Multiple extractions with alveoloplasty  09/01/2011    Procedure: MULTIPLE EXTRACION WITH ALVEOLOPLASTY;  Surgeon: Georgia Lopes, DDS;  Location: [facility] OR;  Service: Oral Surgery;  Laterality: N/A;     Home Meds: Prior to Admission medications   Medication Sig Start Date End Date Taking? Authorizing Provider  acetaminophen (TYLENOL) 500 MG tablet Take 1,000 mg by mouth every 6 (six) hours as needed for moderate pain.    Yes Historical Provider, MD  aspirin  81 MG chewable tablet Chew 81 mg by mouth daily.   Yes Historical Provider, MD  levothyroxine (SYNTHROID, LEVOTHROID) 75 MCG tablet Take 75 mcg by mouth daily before breakfast.   Yes Historical Provider, MD    Allergies: No Known Allergies  History   Social History  . Marital Status: Legally Separated    Spouse Name: N/A    Number of Children: N/A  . Years of Education: N/A   Occupational History  . Not on file.   Social History Main Topics  . Smoking status: Current Every Day Smoker -- 1.00 packs/day for 30 years    Types: Cigarettes  . Smokeless tobacco: Not on file     Comment: 1 pack every 3 days - smoking vapor cigs   . Alcohol Use: No  . Drug Use: Yes    Special: Marijuana, Cocaine     Comment: prior IVDA, cocaine. + occasional narcotics  . Sexual Activity: Not on file   Other Topics Concern  . Not on file   Social History Narrative  . No narrative on file     Family History  Problem Relation Age of Onset  . Cancer    . Coronary artery disease      Review of Systems: see HPI  General: see HPI  Cardiovascular: positive CP per HPI ,no  edema, orthopnea, palpitations, paroxysmal nocturnal dyspnea, shortness of breath or dyspnea on exertion Dermatological: negative for rash Respiratory: negative for cough or wheezing Urologic: negative for hematuria Abdominal: negative for nausea, vomiting, diarrhea, bright red blood per rectum, melena, or hematemesis Neurologic: negative for visual changes,  All other systems reviewed and are otherwise negative except as noted above.  Labs:   Lab Results  Component Value Date   WBC 7.0 02/07/2013   HGB 10.2* 02/07/2013   HCT 30.7* 02/07/2013   MCV 79.1 02/07/2013   PLT 128* 02/07/2013    Recent Labs Lab 02/06/13 1950  NA 132*  K 3.2*  CL 97  CO2 19  BUN 10  CREATININE 0.81  CALCIUM 8.6  PROT 8.1  BILITOT 0.8  ALKPHOS 93  ALT 7  AST 12  GLUCOSE 79    Recent Labs  02/06/13 1951  TROPONINI <0.30   No  results found for this basename: CHOL, HDL, LDLCALC, TRIG   No results found for this basename: DDIMER    Radiology/Studies:  Dg Chest 2 View  02/06/2013   CLINICAL DATA:  Chest pain  EXAM: CHEST  2 VIEW  COMPARISON:  02/02/2013  FINDINGS: Prior median sternotomy for multiple bowel replacements. Normal heart size. No CHF. Chronic appearing right base atelectasis versus scarring. No new airspace process, collapse or consolidation. No effusion or pneumothorax. Trachea midline. Stable exam.  IMPRESSION: Stable postoperative findings.  Negative for edema.  Chronic right base atelectasis versus scarring   Electronically Signed   By: Ruel Favors M.D.   On: 02/06/2013 21:17   Dg Chest 2 View  02/02/2013   CLINICAL DATA:  Chest pain, short of breath  EXAM: CHEST  2 VIEW  COMPARISON:  DG CHEST 1 VIEW dated 09/25/2012; DG CHEST 1V PORT dated 09/25/2012  FINDINGS: Sternotomy wires overlie normal cardiac silhouette. Prosthetic valves are noted. There is mild linear atelectasis in the right lower lobe. Improvement in the pulmonary edema pattern seen on comparison exam. A pacemaker from an inferior approach.  IMPRESSION: 1. Mild platelike atelectasis in the right lower lobe. Cannot completely exclude infiltrate although less favored. 2. Improvement in pulmonary edema pattern following triple valve replacement.   Electronically Signed   By: Genevive Bi M.D.   On: 02/02/2013 15:22   Ct Angio Chest Aortic Dissect W &/or W/o  02/07/2013   CLINICAL DATA:  Pain, pacemaker firing  EXAM: CT ANGIOGRAPHY CHEST, ABDOMEN AND PELVIS  TECHNIQUE: Multidetector CT imaging through the chest, abdomen and pelvis was performed using the standard protocol during bolus administration of intravenous contrast. Multiplanar reconstructed images and MIPs were obtained and reviewed to evaluate the vascular anatomy.  CONTRAST:  OMNIPAQUE IOHEXOL 350 MG/ML SOLN  COMPARISON:  02/06/2013 radiograph  FINDINGS: CTA CHEST FINDINGS  Normal  caliber aorta. Aortic, tricuspid and mitral valve replacements. Heart size upper normal to mildly enlarged. Left anterior abdomen subcutaneous battery pack with lead tips projecting epicardial.  Respiratory motion degrades detailed evaluation. The central pulmonary arteries are patent. The more peripheral branches, particularly within the lower lobes, are obscured by motion.  Central airways are grossly patent. Linear opacity within the middle, lobe and right greater than left lower lobes. No pneumothorax.  Median sternotomy.  Review of the MIP images confirms the above findings.  CTA ABDOMEN AND PELVIS FINDINGS  Abdominal organ evaluation is limited without contrast or in the arterial phase. Within this limitation, no appreciable abnormality of the liver. Distended gallbladder. No radiodense stones. No biliary ductal dilatation. No appreciable abnormality of the pancreas. Splenic granuloma and mild splenomegaly. Mild adreniform enlargement. Areas of right renal scarring. Mild areas of scarring on the left as well. No hydroureteronephrosis.  Limited bowel evaluation. No overt colitis. Appendix not identified. No right lower quadrant inflammation. No bowel obstruction. No free intraperitoneal air or fluid. No lymphadenopathy. Subcentimeter short axis retroperitoneal lymph nodes.  Thin walled bladder. No appreciable abnormality of the uterus and adnexa.  Scattered atherosclerosis of the aorta and branch vessels. Patent celiac axis, SMA, single renal arteries, IMA. Advanced atherosclerotic disease of the bilateral common iliac arteries. The internal and external iliac arteries remain patent as do the common femoral arteries and proximal profunda femoral and superficial femoral arteries.  Multilevel degenerative changes.  No acute osseous finding.  Review of the MIP images confirms the above findings.  IMPRESSION: Degraded by respiratory and cardiac motion. Central pulmonary arteries are patent. More peripheral branches  are nondiagnostic.  Left anterior abdominal wall battery pack with epicardial leads.  Normal caliber aorta with scattered atherosclerotic disease. Aortic, mitral, tricuspid valve replacement.  Linear opacities within the right middle lobe and right greater than left lower lobes, favor atelectasis.  Areas of right greater than left renal scarring.  Mild splenomegaly at 14.5 cm.   Electronically Signed   By: Jearld Lesch M.D.   On: 02/07/2013 02:38   Ct Angio Abd/pel W/ And/or W/o  02/07/2013   CLINICAL DATA:  Pain, pacemaker firing  EXAM: CT ANGIOGRAPHY CHEST, ABDOMEN AND PELVIS  TECHNIQUE: Multidetector CT imaging through the chest, abdomen and pelvis was performed using the standard protocol during bolus administration  of intravenous contrast. Multiplanar reconstructed images and MIPs were obtained and reviewed to evaluate the vascular anatomy.  CONTRAST:  100mL OMNIPAQUE IOHEXOL 350 MG/ML SOLN  COMPARISON:  02/06/2013 radiograph  FINDINGS: CTA CHEST FINDINGS  Normal caliber aorta. Aortic, tricuspid and mitral valve replacements. Heart size upper normal to mildly enlarged. Left anterior abdomen subcutaneous battery pack with lead tips projecting epicardial.  Respiratory motion degrades detailed evaluation. The central pulmonary arteries are patent. The more peripheral branches, particularly within the lower lobes, are obscured by motion.  Central airways are grossly patent. Linear opacity within the middle, lobe and right greater than left lower lobes. No pneumothorax.  Median sternotomy.  Review of the MIP images confirms the above findings.  CTA ABDOMEN AND PELVIS FINDINGS  Abdominal organ evaluation is limited without contrast or in the arterial phase. Within this limitation, no appreciable abnormality of the liver. Distended gallbladder. No radiodense stones. No biliary ductal dilatation. No appreciable abnormality of the pancreas. Splenic granuloma and mild splenomegaly. Mild adreniform enlargement.  Areas of right renal scarring. Mild areas of scarring on the left as well. No hydroureteronephrosis.  Limited bowel evaluation. No overt colitis. Appendix not identified. No right lower quadrant inflammation. No bowel obstruction. No free intraperitoneal air or fluid. No lymphadenopathy. Subcentimeter short axis retroperitoneal lymph nodes.  Thin walled bladder. No appreciable abnormality of the uterus and adnexa.  Scattered atherosclerosis of the aorta and branch vessels. Patent celiac axis, SMA, single renal arteries, IMA. Advanced atherosclerotic disease of the bilateral common iliac arteries. The internal and external iliac arteries remain patent as do the common femoral arteries and proximal profunda femoral and superficial femoral arteries.  Multilevel degenerative changes.  No acute osseous finding.  Review of the MIP images confirms the above findings.  IMPRESSION: Degraded by respiratory and cardiac motion. Central pulmonary arteries are patent. More peripheral branches are nondiagnostic.  Left anterior abdominal wall battery pack with epicardial leads.  Normal caliber aorta with scattered atherosclerotic disease. Aortic, mitral, tricuspid valve replacement.  Linear opacities within the right middle lobe and right greater than left lower lobes, favor atelectasis.  Areas of right greater than left renal scarring.  Mild splenomegaly at 14.5 cm.   Electronically Signed   By: Jearld LeschAndrew  DelGaizo M.D.   On: 02/07/2013 02:38     EKG: A sense V paced   Physical Exam: Blood pressure 82/74, pulse 76, temperature 97.6 F (36.4 C), temperature source Oral, resp. rate 19, last menstrual period 02/05/2013, SpO2 96.00%. General: Well developed, well nourished, in no acute distress. Head: Normocephalic, atraumatic, sclera non-icteric, no xanthomas, nares are without discharge.  Neck: Negative for carotid bruits. JVD not elevated. Lungs: Clear bilaterally to auscultation without wheezes, rales, or rhonchi.  Breathing is unlabored. Heart: RRR with S1 S2. 2/6 systolic flow murmur at the base of the heart, no diastolic murmur appreciated  Abdomen: Soft, non-tender, non-distended with normoactive bowel sounds. No hepatomegaly. No rebound/guarding. No obvious abdominal masses. Msk:  Strength and tone appear normal for age. Extremities: No clubbing or cyanosis. No edema.  Distal pedal pulses are 2+ and equal bilaterally. Neuro: Alert and oriented X 3. No focal deficit. No facial asymmetry. Moves all extremities spontaneously. Psych:  Responds to questions appropriately with a normal affect.    ASSESSMENT AND PLAN:  Chest pain - atypical , normal coronaries in 2012 S/p PPM for heart block  Hx of MVR and TVR    Monitor on tele, PPM seems to be functioning appropraitely based on surfce EKG  If pt has persistent bacteremia can consider repeat Echo    Signed, Deborah Chalk, A , M.D 02/07/2013, 5:34 AM

## 2013-02-07 NOTE — H&P (Addendum)
[facility] History and Physical  Deborah Novemberancy A Fugate ZOX:[id number] DOB: 10-16-1970 DOA: 02/06/2013  Referring physician: ER physician. PCP: Provider Not In System   Chief Complaint: Weakness and chest pain.  HPI: [name] is a 43 y.o. female with history of bacterial endocarditis on bioprosthetic mitral and tricuspid bioprosthetic valve and had redo mitral valve aortic valve and tricuspid valve bioprosthetic replacement last October during which patient had complications with complete heart block and had pacemaker placed presents to the ER because of weakness feeling dizzy and chest pain and upper back pain. Patient had come to the ER 4 days ago with having chest pain which patient felt like shocks. At that time patient's pacemaker was interrogated. Patient states that over the last few days patient has been having chest pain like a shock going through with upper back pain and also has been having diffuse abdominal discomfort with nausea but denies any diarrhea or vomiting. Patient has been having subjective feeling of fever chills. Denies any shortness of breath productive cough. Patient also has been having headaches. In the ER patient was initially found to be hypotensive with blood pressure in the 80s systolics. Patient's hemoglobin level has decreased by 3 g from prior 3 days ago. Stool for occult blood is positive the patient denies having seen any frank rectal bleeding. There was no obvious melena seen by ER physician on rectal exam. Patient's blood pressure improved with 1 L normal saline bolus. Blood cultures, influenza PCR and urine cultures had been sent. Patient was empirically started on vancomycin and Zosyn for possible sepsis. Patient admitted for further workup. Cardiologist on call consulted. Patient denies having any IV drug abuse since her recent valve replacement though she admits to marijuana abuse.  Review of Systems: As presented in the history of presenting illness, rest  negative.  Past Medical History  Diagnosis Date  . Bacterial endocarditis     a. 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 b. re-do mitral valve replacement, tricuspid valve replacement, and aortic valve replacement (all bioprosthetic).  [name]. DJD (degenerative joint disease)   . Hepatitis C     history of  . Anemia   . Hypothyroidism   . Stroke   . Anxiety   . Mental disorder   . Chronic pain   . Noncompliance   . Polysubstance abuse   . Sepsis     a. 05/2010 with sepsis, vegegations on valve replacements, ARDS, VDRF, multiorgan failure with elevated cardiac enzymes, suffered a left fronto-parietal subarachnoid hemorrhage, pulmonary infarcts, ARF requiring brief hemodialysis. s/p perciardial MVR/TVR.  [name]. Atrial flutter     a. Post-op from valve replacement 06/2010.  [name]. CHB (complete heart block)     a. After valve replacement 09/2012 - pacer placement.  . Opiate abuse, episodic    Past Surgical History  Procedure Laterality Date  . Cholecystectomy    . Tonsillectomy and adenoidectomy    . Valve replacement    . Appendectomy    . Coronary artery bypass graft      2012    [facility] Jeannetta [facility]  [facility]  . Cardiac catheterization    . Cataract extraction    . Multiple extractions with alveoloplasty  09/01/2011    Procedure: MULTIPLE EXTRACION WITH ALVEOLOPLASTY;  Surgeon: Georgia LopesScott M Jensen, DDS;  Location: [facility] OR;  Service: Oral Surgery;  Laterality: N/A;  Social History:  reports that she has been smoking Cigarettes.  She has a 30 pack-year smoking history. She does not have any smokeless tobacco history on file. She reports that she uses illicit drugs (Marijuana and Cocaine). She reports that she does not drink alcohol. Where does patient live home. Can patient participate in ADLs? Yes.  No Known Allergies  Family History:  Family History  Problem Relation Age of Onset  . Cancer    . Coronary artery  disease        Prior to Admission medications   Medication Sig Start Date End Date Taking? Authorizing Provider  acetaminophen (TYLENOL) 500 MG tablet Take 1,000 mg by mouth every 6 (six) hours as needed for moderate pain.    Yes Historical Provider, MD  aspirin 81 MG chewable tablet Chew 81 mg by mouth daily.   Yes Historical Provider, MD  levothyroxine (SYNTHROID, LEVOTHROID) 75 MCG tablet Take 75 mcg by mouth daily before breakfast.   Yes Historical Provider, MD    Physical Exam: Filed Vitals:   02/07/13 0030 02/07/13 0045 02/07/13 0115 02/07/13 0143  BP: 88/43 90/49 95/50  98/59  Pulse: 158 78 79 78  Temp:      TempSrc:      Resp: 23 21 14 23   SpO2: 99% 97% 98% 94%     General:  Well-developed and nourished  Eyes: Anicteric no pallor.  ENT: No discharge from the ears eyes nose mouth.  Neck: No mass felt.  Cardiovascular: S1-S2 heard.  Respiratory: No rhonchi or crepitations.  Abdomen: Soft mild tenderness mostly in the left upper quadrant around the pacemaker area.  Skin: No rash.  Musculoskeletal: No edema.  Psychiatric: Appears normal.  Neurologic: Alert awake oriented to time place and person. Moves all extremities.  Labs on Admission:  Basic Metabolic Panel:  Recent Labs Lab 02/02/13 1334 02/06/13 1950  NA 133* 132*  K 4.3 3.2*  CL 96 97  CO2 18* 19  GLUCOSE 100* 79  BUN 13 10  CREATININE 0.82 0.81  CALCIUM 9.6 8.6   Liver Function Tests:  Recent Labs Lab 02/06/13 1950  AST 12  ALT 7  ALKPHOS 93  BILITOT 0.8  PROT 8.1  ALBUMIN 3.4*   No results found for this basename: LIPASE, AMYLASE,  in the last 168 hours No results found for this basename: AMMONIA,  in the last 168 hours CBC:  Recent Labs Lab 02/02/13 1334 02/06/13 1950  WBC 12.4* 9.2  NEUTROABS  --  7.5  HGB 14.5 11.3*  HCT 42.8 33.2*  MCV 81.5 78.5  PLT 279 151   Cardiac Enzymes:  Recent Labs Lab 02/06/13 1951  TROPONINI <0.30    BNP (last 3 results)  Recent  Labs  09/25/12 0936 02/06/13 1951  PROBNP 6304.0* 1450.0*   CBG: No results found for this basename: GLUCAP,  in the last 168 hours  Radiological Exams on Admission: Dg Chest 2 View  02/06/2013   CLINICAL DATA:  Chest pain  EXAM: CHEST  2 VIEW  COMPARISON:  02/02/2013  FINDINGS: Prior median sternotomy for multiple bowel replacements. Normal heart size. No CHF. Chronic appearing right base atelectasis versus scarring. No new airspace process, collapse or consolidation. No effusion or pneumothorax. Trachea midline. Stable exam.  IMPRESSION: Stable postoperative findings.  Negative for edema.  Chronic right base atelectasis versus scarring   Electronically Signed   By: Ruel Favors M.D.   On: 02/06/2013 21:17    EKG: Paced rhythm.  Assessment/Plan Principal Problem:  Sepsis Active Problems:   Hypothyroidism   Substance abuse   History of bacterial endocarditis   Anemia   1. Possible sepsis - patient's symptoms are concerning for sepsis for which patient has empirically been started on vancomycin and Zosyn. Follow blood cultures and urine cultures and flu PCR. Continue with IV hydration. Follow procalcitonin. Sensation is been complaining of upper back pain and abdominal pain CT chest abdomen and pelvis has been ordered. 2. Metabolic acidosis - lactic acid level is normal. Recheck metabolic panel after hydration. 3. Anemia with possible GI bleed - hold aspirin for now. Closely follow CBC. I have placed patient on Protonix IV. Since patient heart valve replacement we will check LDH and haptoglobin for any hemolytic process. 4. Status post redo mitral aortic and tricuspid valve replacement for recent rectal endocarditis - cardiologist has been consulted. We'll follow their recommendations. 5. Tobacco abuse - patient advised to quit smoking. 6. History of IVDA - patient denies any further use of IV drugs since her valve replaced recently.  I have reviewed patient's charts and  labs.  Code Status: Full code.  Family Communication: None.  Disposition Plan: Admit to inpatient.    Aislin Onofre N. [facility] Pager [phone].  If 7PM-7AM, please contact night-coverage [email or web address] Password [facility] 02/07/2013, 2:39 AM

## 2013-02-07 NOTE — Progress Notes (Signed)
     SUBJECTIVE: Still feeling weak.   BP 93/58  Pulse 75  Temp(Src) 97.8 F (36.6 C) (Oral)  Resp 20  Ht 5\' 4"  (1.626 m)  Wt 169 lb 1.5 oz (76.7 kg)  BMI 29.01 kg/m2  SpO2 97%  LMP 02/05/2013  Intake/Output Summary (Last 24 hours) at 02/07/13 9604 Last data filed at 02/07/13 0700  Gross per 24 hour  Intake    803 ml  Output    600 ml  Net    203 ml    PHYSICAL EXAM General: Well developed, well nourished, in no acute distress. Alert and oriented x 3.  Psych:  Good affect, responds appropriately Neck: No JVD. No masses noted.  Lungs: Clear bilaterally with no wheezes or rhonci noted.  Heart: RRR with no murmurs noted. Abdomen: Bowel sounds are present. Soft, non-tender.  Extremities: No lower extremity edema.   LABS: Basic Metabolic Panel:  Recent Labs  54/09/81 1950 02/07/13 0350  NA 132* 138  K 3.2* 3.3*  CL 97 105  CO2 19 16*  GLUCOSE 79 85  BUN 10 9  CREATININE 0.81 0.65  CALCIUM 8.6 7.8*   CBC:  Recent Labs  02/06/13 1950 02/07/13 0350  WBC 9.2 7.0  NEUTROABS 7.5 5.3  HGB 11.3* 10.2*  HCT 33.2* 30.7*  MCV 78.5 79.1  PLT 151 128*   Cardiac Enzymes:  Recent Labs  02/06/13 1951 02/07/13 0350  TROPONINI <0.30 <0.30   Current Meds: . [START ON 02/08/2013] influenza vac split quadrivalent PF  0.5 mL Intramuscular Tomorrow-1000  . levothyroxine  75 mcg Oral QAC breakfast  . piperacillin-tazobactam (ZOSYN)  IV  3.375 g Intravenous Q8H  . [START ON 02/08/2013] pneumococcal 13-valent conjugate vaccine  0.5 mL Intramuscular Tomorrow-1000  . sodium chloride  3 mL Intravenous Q12H  . vancomycin  1,000 mg Intravenous Q12H   ASSESSMENT AND PLAN:  1. Chest pain: Her pain is atypical. Cardiac markers are negative x 2. She is known to have normal coronary arteries by cath 2012 per records. No ischemic evaluation at this time.   2. S/p Mitral valve replacement, Triscuspid valve replacement, aortic valve replacement:  Most recent procedures September  2014 at [facility]. Denies IV drug use since then. Presenting with fevers, abdominal pain, nausea, possibly c/w sepsis. Will arrange echo today.   3. Sepsis: Empiric antibiotics per primary team. Blood cultures pending.     Judi Jaffe  1/26/20158:28 AM

## 2013-02-07 NOTE — Care Management Note (Addendum)
    Page 1 of 1   02/09/2013     12:19:53 PM   CARE MANAGEMENT NOTE 02/09/2013  Patient:  [name], [name]   Account Number:  [id number]  Date Initiated:  02/07/2013  Documentation initiated by:  [name]  Subjective/Objective Assessment:   adm w sepsis     Action/Plan:   lives alone   Anticipated DC Date:     Anticipated DC Plan:        DC Planning Services  CM consult      Choice offered to / List presented to:             Status of service:   Medicare Important Message given?   (If response is "NO", the following Medicare IM given date fields will be blank) Date Medicare IM given:   Date Additional Medicare IM given:    Discharge Disposition:    Per UR Regulation:  Reviewed for med. necessity/level of care/duration of stay  If discussed at Long Length of Stay Meetings, dates discussed:    Comments:  1/28 1218p Deborah Allee Busk rn,bsn spoke w pt. she has medicaid pending and should hear something soon. if medicaid approved they will assign her new pcp. did give her the [place] [facility] resourse list if needed.

## 2013-02-07 NOTE — Progress Notes (Addendum)
CRITICAL VALUE ALERT  Critical value received:  Positive blood cultures:  Anaerobic on first set from 02/06/2013 gram (+) cocci in pairs and aerobic on second set from 02/06/2013 gram (+) cocci in pairs  Date of notification:  02/07/2013  Time of notification:  1435  Critical value read back:yes  Nurse who received alert:  Nicole Cella, RN  MD notified (1st page):  Junious Silk, NP  Time of first page:  1447  MD notified (2nd page):  Time of second page:  Responding MD:  No verbal response from NP. Orders placed for treatment. Time MD responded:  1624

## 2013-02-07 NOTE — Progress Notes (Signed)
ANTIBIOTIC CONSULT NOTE - INITIAL  Pharmacy Consult for Vancomycin, rifampin and gentamicin Indication: endocarditis  No Known Allergies  Patient Measurements: Height: 5\' 4"  (162.6 cm) Weight: 169 lb 1.5 oz (76.7 kg) IBW/kg (Calculated) : 54.7 Adjusted Body Weight: 63.5kg  Vital Signs: Temp: 97.9 F (36.6 C) (01/26 1158) Temp src: Oral (01/26 1158) BP: 103/66 mmHg (01/26 1158) Pulse Rate: 80 (01/26 1158) Intake/Output from previous day: 01/25 0701 - 01/26 0700 In: 803 [P.O.:300; I.V.:503] Out: 600 [Urine:600] Intake/Output from this shift: Total I/O In: 1003 [I.V.:1003] Out: 950 [Urine:950]  Labs:  Recent Labs  02/06/13 1950 02/07/13 0350  WBC 9.2 7.0  HGB 11.3* 10.2*  PLT 151 128*  CREATININE 0.81 0.65   Estimated Creatinine Clearance: 90 ml/min (by C-G formula based on Cr of 0.65). No results found for this basename: VANCOTROUGH, Leodis BinetVANCOPEAK, VANCORANDOM, GENTTROUGH, GENTPEAK, GENTRANDOM, TOBRATROUGH, TOBRAPEAK, TOBRARND, AMIKACINPEAK, AMIKACINTROU, AMIKACIN,  in the last 72 hours   Microbiology: Recent Results (from the past 720 hour(s))  CULTURE, BLOOD (ROUTINE X 2)     Status: None   Collection Time    02/06/13  8:20 PM      Result Value Range Status   Specimen Description BLOOD RIGHT ARM   Final   Special Requests BOTTLES DRAWN AEROBIC AND ANAEROBIC 10CC EA   Final   Culture  Setup Time     Final   Value: 02/07/2013 01:15     Performed at [facility]   Culture     Final   Value: GRAM POSITIVE COCCI IN PAIRS     Note: Gram Stain Report Called to,Read Back By and Verified With: STEPHANIE SHAW@1420  ON [id number] BY [facility]     Performed at [facility]   Report Status PENDING   Incomplete  CULTURE, BLOOD (ROUTINE X 2)     Status: None   Collection Time    02/06/13  8:30 PM      Result Value Range Status   Specimen Description BLOOD LEFT HAND   Final   Special Requests BOTTLES DRAWN AEROBIC ONLY 8CC   Final   Culture  Setup Time     Final   Value: 02/07/2013 01:15     Performed at [facility]   Culture     Final   Value: GRAM POSITIVE COCCI IN PAIRS     Note: CRITICAL RESULT CALLED TO, READ BACK BY AND VERIFIED WITH: STEPHANIE SHAW@1440  ON [id number] BY [facility]     Performed at [facility]   Report Status PENDING   Incomplete  MRSA PCR SCREENING     Status: None   Collection Time    02/07/13  2:28 AM      Result Value Range Status   MRSA by PCR NEGATIVE  NEGATIVE Final   Comment:            The GeneXpert MRSA Assay (FDA     approved for NASAL specimens     only), is one component of a     comprehensive MRSA colonization     surveillance program. It is not     intended to diagnose MRSA     infection nor to guide or     monitor treatment for     MRSA infections.    Medical History: Past Medical History  Diagnosis Date  . Bacterial endocarditis     a. 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  b. re-do mitral valve replacement, tricuspid valve replacement, and aortic valve replacement (all bioprosthetic).  [name] DJD (degenerative joint disease)   . Hepatitis C     history of  . Anemia   . Hypothyroidism   . Stroke   . Anxiety   . Mental disorder   . Chronic pain   . Noncompliance   . Polysubstance abuse   . Sepsis     a. 05/2010 with sepsis, vegegations on valve replacements, ARDS, VDRF, multiorgan failure with elevated cardiac enzymes, suffered a left fronto-parietal subarachnoid hemorrhage, pulmonary infarcts, ARF requiring brief hemodialysis. s/p perciardial MVR/TVR.  [name] Atrial flutter     a. Post-op from valve replacement 06/2010.  [name] CHB (complete heart block)     a. After valve replacement 09/2012 - pacer placement.  . 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 Admitted with hypotension, weakness and chest pain. Blood cultures drawn  last night are 2/2 with gm + cocci growing. Per MAR, there are 3 doses of vancomycin 1500mg  IV charted since last evening ~2300-- however, pharmacy only ever dispensed 1 bag, therefore this is likely a charting error. Patient also has received 1 dose of Zosyn 3.375gm IV. SCr 0.7mg /dL and CrCL ~00PQ/ZRA.  Goal of Therapy:  Vancomycin trough level 15-20 mcg/ml Gentamicin peak level 3-4mg /ml Gentamicin trough level <1 mcg/ml  Plan:  1. Vancomycin 1000mg  IV q8h 2. Gentamicin 60mg  IV q8h (1mg /kg synergy dosing) 3. Rifampin 300mg  IV q8h 4. Follow renal function, echo, clinical progression, ID recommendations and peaks/troughs as indicated   Suzzette Gasparro D. Zac Torti, PharmD, BCPS Clinical Pharmacist Pager: [phone] 02/07/2013 4:49 PM

## 2013-02-08 ENCOUNTER — Encounter: Payer: Self-pay | Admitting: *Deleted

## 2013-02-08 DIAGNOSIS — I4892 Unspecified atrial flutter: Secondary | ICD-10-CM

## 2013-02-08 DIAGNOSIS — B182 Chronic viral hepatitis C: Secondary | ICD-10-CM

## 2013-02-08 DIAGNOSIS — R7881 Bacteremia: Secondary | ICD-10-CM

## 2013-02-08 DIAGNOSIS — R195 Other fecal abnormalities: Secondary | ICD-10-CM

## 2013-02-08 DIAGNOSIS — R0789 Other chest pain: Secondary | ICD-10-CM

## 2013-02-08 DIAGNOSIS — E860 Dehydration: Secondary | ICD-10-CM

## 2013-02-08 DIAGNOSIS — E876 Hypokalemia: Secondary | ICD-10-CM

## 2013-02-08 LAB — BASIC METABOLIC PANEL
BUN: 6 mg/dL (ref 6–23)
CO2: 17 mEq/L — ABNORMAL LOW (ref 19–32)
Calcium: 7.4 mg/dL — ABNORMAL LOW (ref 8.4–10.5)
Chloride: 106 mEq/L (ref 96–112)
Creatinine, Ser: 0.68 mg/dL (ref 0.50–1.10)
Glucose, Bld: 147 mg/dL — ABNORMAL HIGH (ref 70–99)
POTASSIUM: 3.4 meq/L — AB (ref 3.7–5.3)
SODIUM: 135 meq/L — AB (ref 137–147)

## 2013-02-08 LAB — RESPIRATORY VIRUS PANEL
ADENOVIRUS: NOT DETECTED
INFLUENZA A H1: NOT DETECTED
INFLUENZA A H3: NOT DETECTED
Influenza A: NOT DETECTED
Influenza B: NOT DETECTED
Metapneumovirus: NOT DETECTED
Parainfluenza 1: NOT DETECTED
Parainfluenza 2: NOT DETECTED
Parainfluenza 3: NOT DETECTED
RESPIRATORY SYNCYTIAL VIRUS A: NOT DETECTED
RESPIRATORY SYNCYTIAL VIRUS B: NOT DETECTED
RHINOVIRUS: NOT DETECTED

## 2013-02-08 LAB — URINE CULTURE
Colony Count: NO GROWTH
Culture: NO GROWTH
Special Requests: NORMAL

## 2013-02-08 LAB — CBC
HCT: 27 % — ABNORMAL LOW (ref 36.0–46.0)
Hemoglobin: 9 g/dL — ABNORMAL LOW (ref 12.0–15.0)
MCH: 26.5 pg (ref 26.0–34.0)
MCHC: 33.3 g/dL (ref 30.0–36.0)
MCV: 79.4 fL (ref 78.0–100.0)
Platelets: 94 10*3/uL — ABNORMAL LOW (ref 150–400)
RBC: 3.4 MIL/uL — ABNORMAL LOW (ref 3.87–5.11)
RDW: 17.7 % — AB (ref 11.5–15.5)
WBC: 4.5 10*3/uL (ref 4.0–10.5)

## 2013-02-08 LAB — GENTAMICIN LEVEL, TROUGH: Gentamicin Trough: 0.5 ug/mL (ref 0.5–2.0)

## 2013-02-08 MED ORDER — POTASSIUM CHLORIDE CRYS ER 20 MEQ PO TBCR
60.0000 meq | EXTENDED_RELEASE_TABLET | Freq: Once | ORAL | Status: AC
  Administered 2013-02-08: 60 meq via ORAL
  Filled 2013-02-08: qty 3

## 2013-02-08 MED ORDER — SODIUM CHLORIDE 0.9 % IV BOLUS (SEPSIS)
500.0000 mL | Freq: Once | INTRAVENOUS | Status: AC
  Administered 2013-02-08: 500 mL via INTRAVENOUS

## 2013-02-08 MED ORDER — SODIUM CHLORIDE 0.9 % IV SOLN
INTRAVENOUS | Status: DC
Start: 2013-02-08 — End: 2013-02-08

## 2013-02-08 MED ORDER — LORAZEPAM 0.5 MG PO TABS
0.5000 mg | ORAL_TABLET | Freq: Four times a day (QID) | ORAL | Status: DC | PRN
  Administered 2013-02-12 – 2013-02-14 (×5): 0.5 mg via ORAL
  Filled 2013-02-08 (×5): qty 1

## 2013-02-08 MED ORDER — SODIUM CHLORIDE 0.9 % IV SOLN
INTRAVENOUS | Status: DC
  Administered 2013-02-08: 11:00:00 via INTRAVENOUS

## 2013-02-08 NOTE — Progress Notes (Signed)
SUBJECTIVE: Feeling ok this am. No SOB or chest pain. Low grade fevers overnight.   BP 92/60  Pulse 70  Temp(Src) 97.8 F (36.6 C) (Oral)  Resp 19  Ht 5\' 4"  (1.626 m)  Wt 170 lb 6.7 oz (77.3 kg)  BMI 29.24 kg/m2  SpO2 96%  LMP 02/05/2013  Intake/Output Summary (Last 24 hours) at 02/08/13 0650 Last data filed at 02/07/13 2100  Gross per 24 hour  Intake   1878 ml  Output   1400 ml  Net    478 ml    PHYSICAL EXAM General: Well developed, well nourished, in no acute distress. Alert and oriented x 3.  Psych:  Good affect, responds appropriately Neck: No JVD. No masses noted.  Lungs: Clear bilaterally with no wheezes or rhonci noted.  Heart: RRR with no murmurs noted. Abdomen: Bowel sounds are present. Soft, non-tender.  Extremities: No lower extremity edema.   LABS: Basic Metabolic Panel:  Recent Labs  78/29/5601/26/15 0350 02/08/13 0357  NA 138 135*  K 3.3* 3.4*  CL 105 106  CO2 16* 17*  GLUCOSE 85 147*  BUN 9 6  CREATININE 0.65 0.68  CALCIUM 7.8* 7.4*   CBC:  Recent Labs  02/06/13 1950 02/07/13 0350 02/08/13 0357  WBC 9.2 7.0 4.5  NEUTROABS 7.5 5.3  --   HGB 11.3* 10.2* 9.0*  HCT 33.2* 30.7* 27.0*  MCV 78.5 79.1 79.4  PLT 151 128* 94*   Cardiac Enzymes:  Recent Labs  02/07/13 0350 02/07/13 1002 02/07/13 1625  TROPONINI <0.30 <0.30 <0.30    Current Meds: . gentamicin  60 mg Intravenous Q8H  . influenza vac split quadrivalent PF  0.5 mL Intramuscular Tomorrow-1000  . levothyroxine  75 mcg Oral QAC breakfast  . pantoprazole  40 mg Oral Daily  . rifampin (RIFADIN) IVPB  300 mg Intravenous Q8H  . sodium chloride  3 mL Intravenous Q12H  . vancomycin  1,000 mg Intravenous Q8H   Echo 02/07/13: Left ventricle: Poor acoustic windows. Overall LVEF is normal with septal hypokinesis. The cavity size was normal. Wall thickness was normal. - Aortic valve: AV is not well seen. Peak and mean gradients are 26 and 14 mm Hg respectively consistent with  mild stenosis. - Mitral valve: MV prosthesis was difficult to see well Peak and mean gradients are 14 and 6 mm Hg respectively. Trivial MR  ASSESSMENT AND PLAN: 43 yo female with history of IV drug abuse, TVR and MVR followed by endocarditis and then [facility]. Now admitted with sepsis, positive blood cultures.   1. Chest pain: Her pain is atypical. Cardiac markers are negative x 3. She is known to have normal coronary arteries by cath 2012 per records. No ischemic evaluation at this time.   2. S/p Mitral valve replacement, Triscuspid valve replacement, aortic valve replacement: [facility]. Denies IV drug use since then. Presenting with fevers, abdominal pain, nausea, possibly c/w sepsis. Blood cultures are positive for gam + cocci. Echo with normal LV function. No significant valvular regurgitation but with positive blood cultures, history of IV drug abuse with bioprosthetic valve replacement in the tricuspid, aortic and mitral position, will need to arrange TEE. Would make NPO at midnight tonight. I will place on the schedule for TEE tomorrow.   3. Sepsis: Empiric antibiotics per primary team. Blood cultures with gram + cocci.   4. Hypokalemia: Replace K this am.  [name]  1/27/20156:50 AM

## 2013-02-08 NOTE — Progress Notes (Signed)
[name] - Stepdown / ICU Progress Note  [name] UJW:[id number] DOB: 08/19/1970 DOA: [name]/25/2015 PCP: Provider Not In System  Brief narrative: 43 year old female patient with history of recurrent gram + cocci endocarditis. She previously had undergone bioprosthetic mitral tricuspid valve replacements but had recurrent endocarditis in Sept 2014 and subsequently underwent redo mitral and tricuspid valve replacements and also aortic valve replacement all with bioprosthetic devices. [facility] she developed complete heart block and a pacemaker was placed.   She presented to the emergency department because of weakness and chest pain as well as dizziness. States had been ongoing for 4 days and felt like shocks. In the emergency department the patient's pacemaker was interrogated and was functioning appropriately.  After arrival to the ER she was also found to be hypotensive with systolic blood pressures in the 80s. 3 days prior patient has had blood work checked in the ER and now her hemoglobin had decreased by 3 g. Stool for occult blood was positive without any frank rectal bleeding or melena seen on rectal exam per ER physician. She subsequently received [name] L of normal saline. Because of the hypotension a sepsis workup was initiated so urine and blood cultures were obtained and influenza PCR was sent off. She was empirically started on vancomycin and Zosyn. Cardiologist was also consulted.  HPI/Subjective: Patient alert and continuing to complain of constant midsternal chest discomfort. No apparent melena. No reported hematemesis. No shortness of breath  Assessment/Plan:  SIRS/Enterococcus Bacteremia (recurrent) -2/2 blood cx's positive -pharmacy dosing anbx's - have adjusted anbx's to gent and vanco - ID to follow -Cards plans TEE for [name]/28  Dehydration -Continue IV fluids and boluses when necessary -continue OVS  Chest pain, non-cardiac -Appreciate  Cardiology assistance -Normal coronaries per cath 2012 therefore no additional ischemic evaluation indicated -Given heme-positive anemia concerning for GI etiology  -When necessary Mylanta and Ultram and Percocet (see below) -Begin PPI (see below)  Chronic pain -pt admits was dc'd on Methadone [facility] -had been using sparingly due to inability to afford refill- ran out 2 weeks ago so possibly narcotic w/d  Pulmonary HTN - 62 mmHg -Noted on echo prior to recent valve replacement  Hypotension -Suspect combination of volume depletion, recent progression of anemia -Cortisol 14  Anemia/Heme positive stool -Hemoglobin has decreased from 13 to 9 with previous readings in the fall of 2014 much higher in between 14 and 15 -consult GI  -Anemia panel c/w low iron so consider IV dosing - no evidence of large volume gross blood loss   Hypokalemia -Oral replete  Hypothyroidism -TSH 25.123 - suspect noncompliance w/ medications  -Check free T4 normal  Substance abuse  -UDS pos for THC only  History of bacterial endocarditis / s/p recurrent bioprosthetic valve replacements -blood cx's here positive (see above)- pt denies IVDA  DVT prophylaxis: SCDs Code Status: Full Family Communication: no family at bedside Disposition Plan/Expected LOS: Step down  Consultants: Cardiology ID GI  Procedures: None  Antibiotics: Zosyn [name]/25 >>> [name]/26 Vancomycin [name]/25 >>> [name]/26  Objective: Blood pressure 91/47, pulse 75, temperature 97.8 F (36.6 C), temperature source Oral, resp. rate 15, height 5\' 4"  ([name].626 m), weight 170 lb 6.7 oz (77.3 kg), last menstrual period 02/05/2013, SpO2 96.00%.  Intake/Output Summary (Last 24 hours) at 02/08/13 0853 Last data filed at 02/07/13 2100  Gross per 24 hour  Intake   1628 ml  Output   1400 ml  Net    228 ml   Exam: General: No acute respiratory distress Lungs: Clear to auscultation bilaterally without wheezes or  crackles, RA Cardiovascular: Regular rate and rhythm without murmur gallop or rub normal S1 and S2, no peripheral edema or JVD Abdomen: Mildly tender epigastrum, nondistended, soft, bowel sounds positive, no rebound, no ascites, no appreciable mass Musculoskeletal: No significant cyanosis, clubbing of bilateral lower extremities Neurological: Alert and oriented x 3, moves all extremities x 4 without focal neurological deficits, CN 2-12 intact  Scheduled Meds:  Scheduled Meds: . gentamicin  60 mg Intravenous Q8H  . influenza vac split quadrivalent PF  0.5 mL Intramuscular Tomorrow-1000  . levothyroxine  75 mcg Oral QAC breakfast  . pantoprazole  40 mg Oral Daily  . potassium chloride  60 mEq Oral Once  . rifampin (RIFADIN) IVPB  300 mg Intravenous Q8H  . sodium chloride  3 mL Intravenous Q12H  . vancomycin  [name],000 mg Intravenous Q8H   Data Reviewed: Basic Metabolic Panel:  Recent Labs Lab 02/02/13 1334 02/06/13 1950 02/07/13 0350 02/08/13 0357  NA 133* 132* 138 135*  K 4.3 3.2* 3.3* 3.4*  CL 96 97 105 106  CO2 18* 19 16* 17*  GLUCOSE 100* 79 85 147*  BUN 13 10 9 6   CREATININE 0.82 0.81 0.65 0.68  CALCIUM 9.6 8.6 7.8* 7.4*   Liver Function Tests:  Recent Labs Lab 02/06/13 1950 02/07/13 0350  AST 12 10  ALT 7 6  ALKPHOS 93 87  BILITOT 0.8 0.9  PROT 8.[name] 7.4  ALBUMIN 3.4* 2.9*    Recent Labs Lab 02/07/13 0350  LIPASE 16   CBC:  Recent Labs Lab 02/02/13 1334 02/06/13 1950 02/07/13 0350 02/08/13 0357  WBC 12.4* 9.2 7.0 4.5  NEUTROABS  --  7.5 5.3  --   HGB 14.5 11.3* 10.2* 9.0*  HCT 42.8 33.2* 30.7* 27.0*  MCV 81.5 78.5 79.[name] 79.4  PLT 279 151 128* 94*   Cardiac Enzymes:  Recent Labs Lab 02/06/13 1951 02/07/13 0350 02/07/13 1002 02/07/13 1625  TROPONINI <0.30 <0.30 <0.30 <0.30   BNP (last 3 results)  Recent Labs  09/25/12 0936 02/06/13 1951  PROBNP 6304.0* 1450.0*    Recent Results (from the past 240 hour(s))  CULTURE, BLOOD (ROUTINE X  2)     Status: None   Collection Time    02/06/13  8:20 PM      Result Value Range Status   Specimen Description BLOOD RIGHT ARM   Final   Special Requests BOTTLES DRAWN AEROBIC AND ANAEROBIC 10CC EA   Final   Culture  Setup Time     Final   Value: 02/07/2013 01:15     Performed at [facility]   Culture     Final   Value: GRAM POSITIVE COCCI IN PAIRS     Note: Gram Stain Report Called to,Read Back By and Verified With: STEPHANIE SHAW@1420  ON [id number] BY [facility]     Performed at [facility]   Report Status PENDING   Incomplete  CULTURE, BLOOD (ROUTINE X 2)     Status: None   Collection Time    02/06/13  8:30 PM      Result Value Range Status   Specimen Description BLOOD LEFT HAND   Final   Special Requests BOTTLES DRAWN AEROBIC ONLY 8CC   Final   Culture  Setup Time     Final   Value: 02/07/2013 01:15     Performed at [facility]  Culture     Final   Value: GRAM POSITIVE COCCI IN PAIRS     Note: CRITICAL RESULT CALLED TO, READ BACK BY AND VERIFIED WITH: STEPHANIE SHAW@1440  ON [id number] BY [facility]     Performed at [facility]   Report Status PENDING   Incomplete  URINE CULTURE     Status: None   Collection Time    02/06/13 10:16 PM      Result Value Range Status   Specimen Description URINE, CATHETERIZED   Final   Special Requests Normal   Final   Culture  Setup Time     Final   Value: 02/07/2013 04:04     Performed at [employer] Count     Final   Value: NO GROWTH     Performed at [facility]   Culture     Final   Value: NO GROWTH     Performed at [facility]   Report Status 02/08/2013 FINAL   Final  MRSA PCR SCREENING     Status: None   Collection Time    02/07/13  2:28 AM      Result Value Range Status   MRSA by PCR NEGATIVE  NEGATIVE Final   Comment:            The GeneXpert MRSA Assay (FDA     approved for NASAL specimens     only), is one component of a     comprehensive MRSA colonization      surveillance program. It is not     intended to diagnose MRSA     infection nor to guide or     monitor treatment for     MRSA infections.     Studies:  Recent x-ray studies have been reviewed in detail by the Attending Physician  Time spent :     Junious Silk, ANP [facility] Office  [phone] Pager [phone]  **If unable to reach the above provider after paging please contact the Flow Manager @ [phone]  On-Call/Text Page:      [email or web address]      password [email or web address]  If 7PM-7AM, please contact night-coverage [email or web address] Password [facility] [name]/27/2015, 8:53 AM   LOS: 2 days    I have examined the patient, reviewed the chart and modified the above note which I agree with.   Ellamae Lybeck,MD [phone] [name]/27/2015, 3:43 PM

## 2013-02-08 NOTE — Consult Note (Signed)
[place] [facility]  Total days of antibiotics 3        Day 3 vanco        Day 2 gen        ( 1 day of piptazo,rif-both d/c'd)       Reason for Consult: enterococcal bacteremia, concern for PV-cardiac device endocarditis    Referring Physician: rizwan  Active Problems:   Hypothyroidism   Tricuspid regurgitation   Substance abuse   History of bacterial endocarditis   Anemia   SIRS (systemic inflammatory response syndrome)   Dehydration   Chest pain, non-cardiac   Heme positive stool   Pulmonary HTN/62 mmHg   Hypotension   Hypokalemia   Gram-positive bacteremia    HPI: [name] is a 43 y.o. 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 She is now admitted on 1/26 with 5 day history of chest pain, dizziness that corresponded to hypotension, subjective fever, chills, abdominal discomfort with nausea. She was found to be febrile, hypotensive with sBP in 80s.with leukocytosis. Infectious work up revealed 2 sets of blood cultures with enterococcus. She was initially started on vancomycin and piptazo then changed to vanco, gent, rifampin when blood cx showed gpc in prs. tox screen only positive for THC. She is found to have enterococcal bacteremia. She denies any recent GI illness. Only had 1 sick contact, friend with influenza. She reports doing well otherwise .denies IV drug use  Past Medical History  Diagnosis Date  . Bacterial endocarditis     a. 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 b. re-do mitral valve replacement, tricuspid valve replacement, and aortic valve replacement (all bioprosthetic).  [name]  DJD (degenerative joint disease)   . Hepatitis C     history of  . Anemia   . Hypothyroidism   . Stroke   . Anxiety   . Mental disorder   . Chronic pain   . Noncompliance   . Polysubstance abuse   . Sepsis     a. 05/2010 with sepsis, vegegations on valve replacements, ARDS, VDRF, multiorgan failure with elevated cardiac enzymes, suffered a left fronto-parietal subarachnoid hemorrhage, pulmonary infarcts, ARF requiring brief hemodialysis. s/p perciardial MVR/TVR.  [name] Atrial flutter     a. Post-op from valve replacement 06/2010.  [name] CHB (complete heart block)     a. After valve replacement 09/2012 - pacer placement.  . Opiate abuse, episodic     Allergies: No Known Allergies  MEDICATIONS: . gentamicin  60 mg Intravenous Q8H  . levothyroxine  75 mcg Oral QAC breakfast  . pantoprazole  40 mg Oral Daily  . sodium chloride  3 mL Intravenous Q12H  . vancomycin  1,000 mg Intravenous Q8H    History  Substance Use Topics  . Smoking status: Current Every Day Smoker -- 1.00 packs/day for 30 years    Types: Cigarettes  . Smokeless tobacco: Not on file     Comment: 1 pack every 3 days - smoking vapor cigs   . Alcohol Use: No    Family History  Problem Relation Age of Onset  . Cancer    . Coronary artery  disease      Review of Systems  Constitutional: positive for fever, chills, diaphoresis, activity change, appetite change, fatigue and unexpected weight change.  HENT: Negative for congestion, sore throat, rhinorrhea, sneezing, trouble swallowing and sinus pressure.  Eyes: Negative for photophobia and visual disturbance.  Respiratory: Negative for cough, chest tightness, shortness of breath, wheezing and stridor.  Cardiovascular: Negative for chest pain, palpitations and leg swelling.  Gastrointestinal: positive for nausea, negative for vomiting, abdominal pain, diarrhea, constipation, blood in stool, abdominal distention and anal bleeding.  Genitourinary: Negative for dysuria,  hematuria, flank pain and difficulty urinating.  Musculoskeletal: Negative for myalgias, back pain, joint swelling, arthralgias and gait problem.  Skin: Negative for color change, pallor, rash and wound.  Neurological: Negative for dizziness, tremors, weakness and light-headedness.  Hematological: Negative for adenopathy. Does not bruise/bleed easily.  Psychiatric/Behavioral: Negative for behavioral problems, confusion, sleep disturbance, dysphoric mood, decreased concentration and agitation.     OBJECTIVE: Temp:  [97.8 F (36.6 C)-100.1 F (37.8 C)] 98.2 F (36.8 C) (01/27 1231) Pulse Rate:  [70-101] 78 (01/27 1231) Resp:  [10-26] 19 (01/27 1231) BP: (80-115)/(47-67) 104/60 mmHg (01/27 1231) SpO2:  [92 %-100 %] 100 % (01/27 1231) Weight:  [170 lb 6.7 oz (77.3 kg)] 170 lb 6.7 oz (77.3 kg) (01/27 0500)  Constitutional:  oriented to person, place, and time.  appears well-developed and well-nourished. No distress.  HENT:  Mouth/Throat: Oropharynx is clear and moist. No oropharyngeal exudate.  Cardiovascular: Normal rate, regular rhythm and normal heart sounds. Exam reveals no gallop and no friction rub. 2/6 flow murmur Pulmonary/Chest: Effort normal and breath sounds normal. No respiratory distress.  has no wheezes.  Chest wall: sternotomy scar Abdominal: Soft. Bowel sounds are normal.  exhibits no distension. There is no tenderness.  Lymphadenopathy:  no cervical adenopathy.  Neurological: alert and oriented to person, place, and time.  Skin: Skin is warm and dry. No rash noted. No erythema.  Psychiatric:  a normal mood and affect.  behavior is normal.    LABS: Results for orders placed during the hospital encounter of 02/06/13 (from the past 48 hour(s))  CBC WITH DIFFERENTIAL     Status: Abnormal   Collection Time    02/06/13  7:50 PM      Result Value Range   WBC 9.2  4.0 - 10.5 K/uL   RBC 4.23  3.87 - 5.11 MIL/uL   Hemoglobin 11.3 (*) 12.0 - 15.0 g/dL   HCT 33.2 (*) 36.0 -  46.0 %   MCV 78.5  78.0 - 100.0 fL   MCH 26.7  26.0 - 34.0 pg   MCHC 34.0  30.0 - 36.0 g/dL   RDW 17.4 (*) 11.5 - 15.5 %   Platelets 151  150 - 400 K/uL   Neutrophils Relative % 82 (*) 43 - 77 %   Neutro Abs 7.5  1.7 - 7.7 K/uL   Lymphocytes Relative 9 (*) 12 - 46 %   Lymphs Abs 0.8  0.7 - 4.0 K/uL   Monocytes Relative 10  3 - 12 %   Monocytes Absolute 0.9  0.1 - 1.0 K/uL   Eosinophils Relative 0  0 - 5 %   Eosinophils Absolute 0.0  0.0 - 0.7 K/uL   Basophils Relative 0  0 - 1 %   Basophils Absolute 0.0  0.0 - 0.1 K/uL  COMPREHENSIVE METABOLIC PANEL     Status: Abnormal   Collection Time    02/06/13  7:50 PM  Result Value Range   Sodium 132 (*) 137 - 147 mEq/L   Potassium 3.2 (*) 3.7 - 5.3 mEq/L   Chloride 97  96 - 112 mEq/L   CO2 19  19 - 32 mEq/L   Glucose, Bld 79  70 - 99 mg/dL   BUN 10  6 - 23 mg/dL   Creatinine, Ser 0.81  0.50 - 1.10 mg/dL   Calcium 8.6  8.4 - 10.5 mg/dL   Total Protein 8.1  6.0 - 8.3 g/dL   Albumin 3.4 (*) 3.5 - 5.2 g/dL   AST 12  0 - 37 U/L   ALT 7  0 - 35 U/L   Alkaline Phosphatase 93  39 - 117 U/L   Total Bilirubin 0.8  0.3 - 1.2 mg/dL   GFR calc non Af Amer 87 (*) >90 mL/min   GFR calc Af Amer >90  >90 mL/min   Comment: (NOTE)     The eGFR has been calculated using the CKD EPI equation.     This calculation has not been validated in all clinical situations.     eGFR's persistently <90 mL/min signify possible Chronic Kidney     Disease.  LACTIC ACID, PLASMA     Status: None   Collection Time    02/06/13  7:50 PM      Result Value Range   Lactic Acid, Venous 1.2  0.5 - 2.2 mmol/L  PROTIME-INR     Status: None   Collection Time    02/06/13  7:50 PM      Result Value Range   Prothrombin Time 15.2  11.6 - 15.2 seconds   INR 1.23  0.00 - 1.49  FIBRINOGEN     Status: None   Collection Time    02/06/13  7:50 PM      Result Value Range   Fibrinogen 463  204 - 475 mg/dL  TROPONIN I     Status: None   Collection Time    02/06/13  7:51 PM        Result Value Range   Troponin I <0.30  <0.30 ng/mL   Comment:            Due to the release kinetics of cTnI,     a negative result within the first hours     of the onset of symptoms does not rule out     myocardial infarction with certainty.     If myocardial infarction is still suspected,     repeat the test at appropriate intervals.  PRO B NATRIURETIC PEPTIDE     Status: Abnormal   Collection Time    02/06/13  7:51 PM      Result Value Range   Pro B Natriuretic peptide (BNP) 1450.0 (*) 0 - 125 pg/mL  CULTURE, BLOOD (ROUTINE X 2)     Status: None   Collection Time    02/06/13  8:20 PM      Result Value Range   Specimen Description BLOOD RIGHT ARM     Special Requests BOTTLES DRAWN AEROBIC AND ANAEROBIC 10CC EA     Culture  Setup Time       Value: 02/07/2013 01:15     Performed at [facility]   Culture       Value: ENTEROCOCCUS SPECIES     Note: Gram Stain Report Called to,Read Back By and Verified With: STEPHANIE SHAW@[email or web address]  ON [id number] BY [facility]     Performed at [facility]  Report Status PENDING    CULTURE, BLOOD (ROUTINE X 2)     Status: None   Collection Time    02/06/13  8:30 PM      Result Value Range   Specimen Description BLOOD LEFT HAND     Special Requests BOTTLES DRAWN AEROBIC ONLY 8CC     Culture  Setup Time       Value: 02/07/2013 01:15     Performed at [facility]   Culture       Value: ENTEROCOCCUS SPECIES     Note: CRITICAL RESULT CALLED TO, READ BACK BY AND VERIFIED WITH: STEPHANIE SHAW@[email or web address]  ON [id number] BY [facility]     Performed at [facility]   Report Status PENDING    INFLUENZA PANEL BY PCR (TYPE A & B, H1N1)     Status: None   Collection Time    02/06/13  9:37 PM      Result Value Range   Influenza A By PCR NEGATIVE  NEGATIVE   Influenza B By PCR NEGATIVE  NEGATIVE   H1N1 flu by pcr NOT DETECTED  NOT DETECTED   Comment:            The Xpert Flu assay (FDA approved for     nasal aspirates or washes and      nasopharyngeal swab specimens), is     intended as an aid in the diagnosis of     influenza and should not be used as     a sole basis for treatment.  URINALYSIS, ROUTINE W REFLEX MICROSCOPIC     Status: Abnormal   Collection Time    02/06/13 10:16 PM      Result Value Range   Color, Urine YELLOW  YELLOW   APPearance CLEAR  CLEAR   Specific Gravity, Urine 1.015  1.005 - 1.030   pH 6.0  5.0 - 8.0   Glucose, UA NEGATIVE  NEGATIVE mg/dL   Hgb urine dipstick SMALL (*) NEGATIVE   Bilirubin Urine NEGATIVE  NEGATIVE   Ketones, ur NEGATIVE  NEGATIVE mg/dL   Protein, ur NEGATIVE  NEGATIVE mg/dL   Urobilinogen, UA 0.2  0.0 - 1.0 mg/dL   Nitrite NEGATIVE  NEGATIVE   Leukocytes, UA NEGATIVE  NEGATIVE  URINE CULTURE     Status: None   Collection Time    02/06/13 10:16 PM      Result Value Range   Specimen Description URINE, CATHETERIZED     Special Requests Normal     Culture  Setup Time       Value: 02/07/2013 04:04     Performed at [place]       Value: NO GROWTH     Performed at [facility]   Culture       Value: NO GROWTH     Performed at [facility]   Report Status 02/08/2013 FINAL    URINE MICROSCOPIC-ADD ON     Status: Abnormal   Collection Time    02/06/13 10:16 PM      Result Value Range   Squamous Epithelial / LPF FEW (*) RARE   WBC, UA 0-2  <3 WBC/hpf   RBC / HPF 0-2  <3 RBC/hpf  OCCULT BLOOD, POC DEVICE     Status: Abnormal   Collection Time    02/07/13 12:35 AM      Result Value Range   Fecal Occult Bld POSITIVE (*) NEGATIVE  MRSA PCR SCREENING  Status: None   Collection Time    02/07/13  2:28 AM      Result Value Range   MRSA by PCR NEGATIVE  NEGATIVE   Comment:            The GeneXpert MRSA Assay (FDA     approved for NASAL specimens     only), is one component of a     comprehensive MRSA colonization     surveillance program. It is not     intended to diagnose MRSA     infection nor to guide or     monitor  treatment for     MRSA infections.  PREGNANCY, URINE     Status: None   Collection Time    02/07/13  3:40 AM      Result Value Range   Preg Test, Ur NEGATIVE  NEGATIVE   Comment:            THE SENSITIVITY OF THIS     METHODOLOGY IS >20 mIU/mL.  URINE RAPID DRUG SCREEN (HOSP PERFORMED)     Status: Abnormal   Collection Time    02/07/13  3:41 AM      Result Value Range   Opiates NONE DETECTED  NONE DETECTED   Cocaine NONE DETECTED  NONE DETECTED   Benzodiazepines NONE DETECTED  NONE DETECTED   Amphetamines NONE DETECTED  NONE DETECTED   Tetrahydrocannabinol POSITIVE (*) NONE DETECTED   Barbiturates NONE DETECTED  NONE DETECTED   Comment:            DRUG SCREEN FOR MEDICAL PURPOSES     ONLY.  IF CONFIRMATION IS NEEDED     FOR ANY PURPOSE, NOTIFY LAB     WITHIN 5 DAYS.                LOWEST DETECTABLE LIMITS     FOR URINE DRUG SCREEN     Drug Class       Cutoff (ng/mL)     Amphetamine      1000     Barbiturate      200     Benzodiazepine   254     Tricyclics       270     Opiates          300     Cocaine          300     THC              50  CBC WITH DIFFERENTIAL     Status: Abnormal   Collection Time    02/07/13  3:50 AM      Result Value Range   WBC 7.0  4.0 - 10.5 K/uL   RBC 3.88  3.87 - 5.11 MIL/uL   Hemoglobin 10.2 (*) 12.0 - 15.0 g/dL   HCT 30.7 (*) 36.0 - 46.0 %   MCV 79.1  78.0 - 100.0 fL   MCH 26.3  26.0 - 34.0 pg   MCHC 33.2  30.0 - 36.0 g/dL   RDW 17.5 (*) 11.5 - 15.5 %   Platelets 128 (*) 150 - 400 K/uL   Neutrophils Relative % 76  43 - 77 %   Neutro Abs 5.3  1.7 - 7.7 K/uL   Lymphocytes Relative 13  12 - 46 %   Lymphs Abs 0.9  0.7 - 4.0 K/uL   Monocytes Relative 11  3 - 12 %   Monocytes Absolute 0.7  0.1 - 1.0  K/uL   Eosinophils Relative 0  0 - 5 %   Eosinophils Absolute 0.0  0.0 - 0.7 K/uL   Basophils Relative 0  0 - 1 %   Basophils Absolute 0.0  0.0 - 0.1 K/uL  COMPREHENSIVE METABOLIC PANEL     Status: Abnormal   Collection Time    02/07/13   3:50 AM      Result Value Range   Sodium 138  137 - 147 mEq/L   Potassium 3.3 (*) 3.7 - 5.3 mEq/L   Chloride 105  96 - 112 mEq/L   Comment: DELTA CHECK NOTED   CO2 16 (*) 19 - 32 mEq/L   Glucose, Bld 85  70 - 99 mg/dL   BUN 9  6 - 23 mg/dL   Creatinine, Ser 0.65  0.50 - 1.10 mg/dL   Calcium 7.8 (*) 8.4 - 10.5 mg/dL   Total Protein 7.4  6.0 - 8.3 g/dL   Albumin 2.9 (*) 3.5 - 5.2 g/dL   AST 10  0 - 37 U/L   ALT 6  0 - 35 U/L   Alkaline Phosphatase 87  39 - 117 U/L   Total Bilirubin 0.9  0.3 - 1.2 mg/dL   GFR calc non Af Amer >90  >90 mL/min   GFR calc Af Amer >90  >90 mL/min   Comment: (NOTE)     The eGFR has been calculated using the CKD EPI equation.     This calculation has not been validated in all clinical situations.     eGFR's persistently <90 mL/min signify possible Chronic Kidney     Disease.  LACTATE DEHYDROGENASE     Status: None   Collection Time    02/07/13  3:50 AM      Result Value Range   LDH 180  94 - 250 U/L  HAPTOGLOBIN     Status: Abnormal   Collection Time    02/07/13  3:50 AM      Result Value Range   Haptoglobin 260 (*) 45 - 215 mg/dL   Comment: Performed at [facility]  TROPONIN I     Status: None   Collection Time    02/07/13  3:50 AM      Result Value Range   Troponin I <0.30  <0.30 ng/mL   Comment:            Due to the release kinetics of cTnI,     a negative result within the first hours     of the onset of symptoms does not rule out     myocardial infarction with certainty.     If myocardial infarction is still suspected,     repeat the test at appropriate intervals.  TYPE AND SCREEN     Status: None   Collection Time    02/07/13  3:50 AM      Result Value Range   ABO/RH(D) B POS     Antibody Screen NEG     Sample Expiration 02/10/2013    TSH     Status: Abnormal   Collection Time    02/07/13  3:50 AM      Result Value Range   TSH 25.123 (*) 0.350 - 4.500 uIU/mL   Comment: Performed at [place]      Status: None   Collection Time    02/07/13  3:50 AM      Result Value Range   Cortisol, Plasma 14.0     Comment: (  NOTE)     AM:  4.3 - 22.4 ug/dL     PM:  3.1 - 16.7 ug/dL     Performed at [place]     Status: Abnormal   Collection Time    02/07/13  3:50 AM      Result Value Range   Sed Rate 50 (*) 0 - 22 mm/hr  LIPASE, BLOOD     Status: None   Collection Time    02/07/13  3:50 AM      Result Value Range   Lipase 16  11 - 59 U/L  PROCALCITONIN     Status: None   Collection Time    02/07/13  3:50 AM      Result Value Range   Procalcitonin <0.10     Comment:            Interpretation:     PCT (Procalcitonin) <= 0.5 ng/mL:     Systemic infection (sepsis) is not likely.     Local bacterial infection is possible.     (NOTE)             ICU PCT Algorithm               Non ICU PCT Algorithm        ----------------------------     ------------------------------             PCT < 0.25 ng/mL                 PCT < 0.1 ng/mL         Stopping of antibiotics            Stopping of antibiotics           strongly encouraged.               strongly encouraged.        ----------------------------     ------------------------------           PCT level decrease by               PCT < 0.25 ng/mL           >= 80% from peak PCT           OR PCT 0.25 - 0.5 ng/mL          Stopping of antibiotics                                                 encouraged.         Stopping of antibiotics               encouraged.        ----------------------------     ------------------------------           PCT level decrease by              PCT >= 0.25 ng/mL           < 80% from peak PCT            AND PCT >= 0.5 ng/mL            Continuing antibiotics  encouraged.           Continuing antibiotics                encouraged.        ----------------------------     ------------------------------         PCT level increase compared           PCT > 0.5 ng/mL             with peak PCT AND              PCT >= 0.5 ng/mL             Escalation of antibiotics                                              strongly encouraged.          Escalation of antibiotics            strongly encouraged.  ABO/RH     Status: None   Collection Time    02/07/13  3:50 AM      Result Value Range   ABO/RH(D) B POS    TROPONIN I     Status: None   Collection Time    02/07/13 10:02 AM      Result Value Range   Troponin I <0.30  <0.30 ng/mL   Comment:            Due to the release kinetics of cTnI,     a negative result within the first hours     of the onset of symptoms does not rule out     myocardial infarction with certainty.     If myocardial infarction is still suspected,     repeat the test at appropriate intervals.  VITAMIN B12     Status: None   Collection Time    02/07/13 10:02 AM      Result Value Range   Vitamin B-12 355  211 - 911 pg/mL   Comment: Performed at [place]     Status: None   Collection Time    02/07/13 10:02 AM      Result Value Range   Folate 14.7     Comment: (NOTE)     Reference Ranges            Deficient:       0.4 - 3.3 ng/mL            Indeterminate:   3.4 - 5.4 ng/mL            Normal:              > 5.4 ng/mL     Performed at [place] [facility]     Status: Abnormal   Collection Time    02/07/13 10:02 AM      Result Value Range   Iron 18 (*) 42 - 135 ug/dL   TIBC 294  250 - 470 ug/dL   Saturation Ratios 6 (*) 20 - 55 %   UIBC 276  125 - 400 ug/dL   Comment: Performed at [place]     Status: None   Collection Time    02/07/13 10:02 AM      Result Value Range   Ferritin 80  10 -  291 ng/mL   Comment: Performed at [place]     Status: Abnormal   Collection Time    02/07/13 10:02 AM      Result Value Range   Retic Ct Pct 1.0  0.4 - 3.1 %   RBC. 3.84 (*) 3.87 - 5.11 MIL/uL   Retic Count, Manual 38.4   19.0 - 186.0 K/uL  T4, FREE     Status: None   Collection Time    02/07/13  2:00 PM      Result Value Range   Free T4 1.10  0.80 - 1.80 ng/dL   Comment: Performed at [facility]  TROPONIN I     Status: None   Collection Time    02/07/13  4:25 PM      Result Value Range   Troponin I <0.30  <0.30 ng/mL   Comment:            Due to the release kinetics of cTnI,     a negative result within the first hours     of the onset of symptoms does not rule out     myocardial infarction with certainty.     If myocardial infarction is still suspected,     repeat the test at appropriate intervals.  CBC     Status: Abnormal   Collection Time    02/08/13  3:57 AM      Result Value Range   WBC 4.5  4.0 - 10.5 K/uL   RBC 3.40 (*) 3.87 - 5.11 MIL/uL   Hemoglobin 9.0 (*) 12.0 - 15.0 g/dL   HCT 27.0 (*) 36.0 - 46.0 %   MCV 79.4  78.0 - 100.0 fL   MCH 26.5  26.0 - 34.0 pg   MCHC 33.3  30.0 - 36.0 g/dL   RDW 17.7 (*) 11.5 - 15.5 %   Platelets 94 (*) 150 - 400 K/uL   Comment: REPEATED TO VERIFY     PLATELET COUNT CONFIRMED BY SMEAR     DELTA CHECK NOTED  BASIC METABOLIC PANEL     Status: Abnormal   Collection Time    02/08/13  3:57 AM      Result Value Range   Sodium 135 (*) 137 - 147 mEq/L   Potassium 3.4 (*) 3.7 - 5.3 mEq/L   Chloride 106  96 - 112 mEq/L   CO2 17 (*) 19 - 32 mEq/L   Glucose, Bld 147 (*) 70 - 99 mg/dL   BUN 6  6 - 23 mg/dL   Creatinine, Ser 0.68  0.50 - 1.10 mg/dL   Calcium 7.4 (*) 8.4 - 10.5 mg/dL   GFR calc non Af Amer >90  >90 mL/min   GFR calc Af Amer >90  >90 mL/min   Comment: (NOTE)     The eGFR has been calculated using the CKD EPI equation.     This calculation has not been validated in all clinical situations.     eGFR's persistently <90 mL/min signify possible Chronic Kidney     Disease.    MICRO:  IMAGING: Dg Chest 2 View  02/06/2013   CLINICAL DATA:  Chest pain  EXAM: CHEST  2 VIEW  COMPARISON:  02/02/2013  FINDINGS: Prior median sternotomy  for multiple bowel replacements. Normal heart size. No CHF. Chronic appearing right base atelectasis versus scarring. No new airspace process, collapse or consolidation. No effusion or pneumothorax. Trachea midline. Stable exam.  IMPRESSION: Stable postoperative findings.  Negative for  edema.  Chronic right base atelectasis versus scarring   Electronically Signed   By: Daryll Brod M.D.   On: 02/06/2013 21:17   Ct Angio Chest Aortic Dissect W &/or W/o  02/07/2013   CLINICAL DATA:  Pain, pacemaker firing  EXAM: CT ANGIOGRAPHY CHEST, ABDOMEN AND PELVIS  TECHNIQUE: Multidetector CT imaging through the chest, abdomen and pelvis was performed using the standard protocol during bolus administration of intravenous contrast. Multiplanar reconstructed images and MIPs were obtained and reviewed to evaluate the vascular anatomy.  CONTRAST:  171mL OMNIPAQUE IOHEXOL 350 MG/ML SOLN  COMPARISON:  02/06/2013 radiograph  FINDINGS: CTA CHEST FINDINGS  Normal caliber aorta. Aortic, tricuspid and mitral valve replacements. Heart size upper normal to mildly enlarged. Left anterior abdomen subcutaneous battery pack with lead tips projecting epicardial.  Respiratory motion degrades detailed evaluation. The central pulmonary arteries are patent. The more peripheral branches, particularly within the lower lobes, are obscured by motion.  Central airways are grossly patent. Linear opacity within the middle, lobe and right greater than left lower lobes. No pneumothorax.  Median sternotomy.  Review of the MIP images confirms the above findings.  CTA ABDOMEN AND PELVIS FINDINGS  Abdominal organ evaluation is limited without contrast or in the arterial phase. Within this limitation, no appreciable abnormality of the liver. Distended gallbladder. No radiodense stones. No biliary ductal dilatation. No appreciable abnormality of the pancreas. Splenic granuloma and mild splenomegaly. Mild adreniform enlargement. Areas of right renal scarring.  Mild areas of scarring on the left as well. No hydroureteronephrosis.  Limited bowel evaluation. No overt colitis. Appendix not identified. No right lower quadrant inflammation. No bowel obstruction. No free intraperitoneal air or fluid. No lymphadenopathy. Subcentimeter short axis retroperitoneal lymph nodes.  Thin walled bladder. No appreciable abnormality of the uterus and adnexa.  Scattered atherosclerosis of the aorta and branch vessels. Patent celiac axis, SMA, single renal arteries, IMA. Advanced atherosclerotic disease of the bilateral common iliac arteries. The internal and external iliac arteries remain patent as do the common femoral arteries and proximal profunda femoral and superficial femoral arteries.  Multilevel degenerative changes.  No acute osseous finding.  Review of the MIP images confirms the above findings.  IMPRESSION: Degraded by respiratory and cardiac motion. Central pulmonary arteries are patent. More peripheral branches are nondiagnostic.  Left anterior abdominal wall battery pack with epicardial leads.  Normal caliber aorta with scattered atherosclerotic disease. Aortic, mitral, tricuspid valve replacement.  Linear opacities within the right middle lobe and right greater than left lower lobes, favor atelectasis.  Areas of right greater than left renal scarring.  Mild splenomegaly at 14.5 cm.   Electronically Signed   By: Carlos Levering M.D.   On: 02/07/2013 02:38   Ct Angio Abd/pel W/ And/or W/o  02/07/2013   CLINICAL DATA:  Pain, pacemaker firing  EXAM: CT ANGIOGRAPHY CHEST, ABDOMEN AND PELVIS  TECHNIQUE: Multidetector CT imaging through the chest, abdomen and pelvis was performed using the standard protocol during bolus administration of intravenous contrast. Multiplanar reconstructed images and MIPs were obtained and reviewed to evaluate the vascular anatomy.  CONTRAST:  167mL OMNIPAQUE IOHEXOL 350 MG/ML SOLN  COMPARISON:  02/06/2013 radiograph  FINDINGS: CTA CHEST FINDINGS   Normal caliber aorta. Aortic, tricuspid and mitral valve replacements. Heart size upper normal to mildly enlarged. Left anterior abdomen subcutaneous battery pack with lead tips projecting epicardial.  Respiratory motion degrades detailed evaluation. The central pulmonary arteries are patent. The more peripheral branches, particularly within the lower lobes, are obscured by motion.  Central airways are grossly patent. Linear opacity within the middle, lobe and right greater than left lower lobes. No pneumothorax.  Median sternotomy.  Review of the MIP images confirms the above findings.  CTA ABDOMEN AND PELVIS FINDINGS  Abdominal organ evaluation is limited without contrast or in the arterial phase. Within this limitation, no appreciable abnormality of the liver. Distended gallbladder. No radiodense stones. No biliary ductal dilatation. No appreciable abnormality of the pancreas. Splenic granuloma and mild splenomegaly. Mild adreniform enlargement. Areas of right renal scarring. Mild areas of scarring on the left as well. No hydroureteronephrosis.  Limited bowel evaluation. No overt colitis. Appendix not identified. No right lower quadrant inflammation. No bowel obstruction. No free intraperitoneal air or fluid. No lymphadenopathy. Subcentimeter short axis retroperitoneal lymph nodes.  Thin walled bladder. No appreciable abnormality of the uterus and adnexa.  Scattered atherosclerosis of the aorta and branch vessels. Patent celiac axis, SMA, single renal arteries, IMA. Advanced atherosclerotic disease of the bilateral common iliac arteries. The internal and external iliac arteries remain patent as do the common femoral arteries and proximal profunda femoral and superficial femoral arteries.  Multilevel degenerative changes.  No acute osseous finding.  Review of the MIP images confirms the above findings.  IMPRESSION: Degraded by respiratory and cardiac motion. Central pulmonary arteries are patent. More peripheral  branches are nondiagnostic.  Left anterior abdominal wall battery pack with epicardial leads.  Normal caliber aorta with scattered atherosclerotic disease. Aortic, mitral, tricuspid valve replacement.  Linear opacities within the right middle lobe and right greater than left lower lobes, favor atelectasis.  Areas of right greater than left renal scarring.  Mild splenomegaly at 14.5 cm.   Electronically Signed   By: Carlos Levering M.D.   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  - would treat presumptively as PVE with gentamicin plus vancomycin for now. If her enterococcus speciation returns as amp S, would switch from vancomycin and ampicillin to treat for a total of 6 wks - agree with getting TEE to see if there is vegetation on PM lead to account for chest pain. If +vegetation on PM, will need to discuss device removal. - await results from repeat blood cx today to document clearance of bacteremia.  Elzie Rings [facility] [phone]

## 2013-02-08 NOTE — ED Provider Notes (Signed)
Medical screening examination/treatment/procedure(s) were conducted as a shared visit with resident-physician practitioner(s) and myself.  I personally evaluated the patient during the encounter.  Pt is a 43 y.o. female with pmhx as above presenting with malaise, CP, back pain, and ongoing sensation of "shocking" chest pain.  Pt found to have no pacemaker dysfunction or defibrillations on recent interrogation for similar complaint.  Hx not c/w defibrillation.   On PE, pt has persisently borderline low BPs, tachycardia, is febrile, ill, but non-toxic appearing. Systolic murmur hear w/ clear lung fields. +ttp LUQ w/o rebound or guarding.  Sepsis w/u initiated, started on empiric abx, but no clear source found.  Pt admitted to triad.     EKG Interpretation    Date/Time:  Sunday February 06 2013 19:59:53 EST Ventricular Rate:  93 PR Interval:  158 QRS Duration: 153 QT Interval:  398 QTC Calculation: 495 R Axis:   -36 Text Interpretation:  Atrial-sensed ventricular-paced rhythm No further analysis attempted due to paced rhythm Confirmed by DOCHERTY  MD, MEGAN ([id number]) on 02/06/2013 8:05:02 PM             Shanna Cisco, MD 02/08/13 1105

## 2013-02-08 NOTE — Progress Notes (Signed)
ANTIBIOTIC CONSULT NOTE - Follow Up  Pharmacy Consult for Vancomycin, rifampin and gentamicin Indication: endocarditis  No Known Allergies  Patient Measurements: Height: 5\' 4"  (162.6 cm) Weight: 170 lb 6.7 oz (77.3 kg) IBW/kg (Calculated) : 54.7 Adjusted Body Weight: 63.5kg  Vital Signs: Temp: 98.4 F (36.9 C) (01/27 2000) Temp src: Oral (01/27 2000) BP: 103/68 mmHg (01/27 2000) Pulse Rate: 89 (01/27 2000) Intake/Output from previous day: 01/26 0701 - 01/27 0700 In: 1753 [I.V.:1753] Out: 1400 [Urine:1400] Intake/Output from this shift: Total I/O In: 480 [P.O.:480] Out: -   Labs:  Recent Labs  02/06/13 1950 02/07/13 0350 02/08/13 0357  WBC 9.2 7.0 4.5  HGB 11.3* 10.2* 9.0*  PLT 151 128* 94*  CREATININE 0.81 0.65 0.68   Estimated Creatinine Clearance: 90.2 ml/min (by C-G formula based on Cr of 0.68).  Recent Labs  02/08/13 1900  GENTTROUGH 0.5     Microbiology: Recent Results (from the past 720 hour(s))  CULTURE, BLOOD (ROUTINE X 2)     Status: None   Collection Time    02/06/13  8:20 PM      Result Value Range Status   Specimen Description BLOOD RIGHT ARM   Final   Special Requests BOTTLES DRAWN AEROBIC AND ANAEROBIC 10CC EA   Final   Culture  Setup Time     Final   Value: 02/07/2013 01:15     [facility] at [facility]   Culture     Final   Value: ENTEROCOCCUS SPECIES     Note: Gram Stain Report Called to,Read Back By and Verified With: STEPHANIE SHAW@1420  ON [id number] BY [facility]     Performed at [facility]   Report Status PENDING   Incomplete  CULTURE, BLOOD (ROUTINE X 2)     Status: None   Collection Time    02/06/13  8:30 PM      Result Value Range Status   Specimen Description BLOOD LEFT HAND   Final   Special Requests BOTTLES DRAWN AEROBIC ONLY 8CC   Final   Culture  Setup Time     Final   Value: 02/07/2013 01:15     Performed at [facility]   Culture     Final   Value: ENTEROCOCCUS SPECIES     Note: CRITICAL RESULT  CALLED TO, READ BACK BY AND VERIFIED WITH: STEPHANIE SHAW@1440  ON [id number] BY [facility]     Performed at [facility]   Report Status PENDING   Incomplete  URINE CULTURE     Status: None   Collection Time    02/06/13 10:16 PM      Result Value Range Status   Specimen Description URINE, CATHETERIZED   Final   Special Requests Normal   Final   Culture  Setup Time     Final   Value: 02/07/2013 04:04     Performed at [facility]   Colony Count     Final   Value: NO GROWTH     Performed at [facility]   Culture     Final   Value: NO GROWTH     Performed at [facility]   Report Status 02/08/2013 FINAL   Final  MRSA PCR SCREENING     Status: None   Collection Time    02/07/13  2:28 AM      Result Value Range Status   MRSA by PCR NEGATIVE  NEGATIVE Final   Comment:  The GeneXpert MRSA Assay (FDA     approved for NASAL specimens     only), is one component of a     comprehensive MRSA colonization     surveillance program. It is not     intended to diagnose MRSA     infection nor to guide or     monitor treatment for     MRSA infections.    Medical History: Past Medical History  Diagnosis Date  . Bacterial endocarditis     a. 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 b. re-do mitral valve replacement, tricuspid valve replacement, and aortic valve replacement (all bioprosthetic).  [name] DJD (degenerative joint disease)   . Hepatitis C     history of  . Anemia   . Hypothyroidism   . Stroke   . Anxiety   . Mental disorder   . Chronic pain   . Noncompliance   . Polysubstance abuse   . Sepsis     a. 05/2010 with sepsis, vegegations on valve replacements, ARDS, VDRF, multiorgan failure with elevated cardiac enzymes, suffered a left fronto-parietal subarachnoid hemorrhage, pulmonary infarcts, ARF requiring brief hemodialysis. s/p perciardial MVR/TVR.  [name] Atrial flutter     a.  Post-op from valve replacement 06/2010.  [name] CHB (complete heart block)     a. After valve replacement 09/2012 - pacer placement.  . 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 Admitted with hypotension, weakness and chest pain. Blood cultures drawn last night are 2/2 with gm + cocci growing. Per MAR, there are 3 doses of vancomycin 1500mg  IV charted since last evening ~2300-- however, pharmacy only ever dispensed 1 bag, therefore this is likely a charting error. Patient also has received 1 dose of Zosyn 3.375gm IV. SCr 0.7mg /dL and CrCL ~76PP/JKD.    Gentamicin trough - ~ 0.5 and indicates appropriate clearance.  Goal of Therapy:  Vancomycin trough level 15-20 mcg/ml Gentamicin peak level 3-4mg /ml Gentamicin trough level <1 mcg/ml  Plan:  1. Continue Gentamicin 60mg  IV q8h (1mg /kg synergy dosing) 2.  Continue Vancomycin and Rifampin as ordered. 4. Follow renal function, echo, clinical progression, ID recommendations and peaks/troughs as indicated  Nadara Mustard, PharmD., MS Clinical Pharmacist Pager:  [phone] Thank you for allowing pharmacy to be part of this patients care team. 02/08/2013 9:04 PM

## 2013-02-08 NOTE — Consult Note (Signed)
[facility] Consult: 10:24 AM 02/08/2013  LOS: 2 days    Referring Provider: Junious Silkallison ellis NP for hospitalist Primary Care Physician:  None.  Medicaid pending Primary Gastroenterologist: unassigned   Reason for Consultation:  Anemia and FOBT +   HPI: [name] is a 43 y.o. female.  Hx IVDA and gram + bacterial endocarditis.  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  Had pacemaker placed for heart block .  Complications included retinitis, AKI, lice.  No medical follow up since discharge, waiting for Medicaid to kick in.  Diagnosed with Hep C age 43, 3327. Says she has tested negative for HIV on several past assays.    In ED last week with chest pain, pacer interrogated and benign, discharged to out pt cardiac follow up.  Admitted 2 days ago with tingling chest pain, dizzyness.  SBP in 80s.  Pacemaker interrogated and functioning appropriately.   Given IVF, started on Zosyn and Vanc.  Enterococcus in blood cultures  Will undergo TEE 1/28. CT angio abdomen with splenomegaly, patent mesenteric vessels. However advanced atherosclerotic disease of the bilateral common iliac arteries.  Liver unremarkable.   hgb is 3 grams lower than last week.  Stool is FOB + but she has not had melena, BPR , nausea or emesis.  Does not take NSAID except  81 mg ASA.  No previous transfusions, iron supplements, EGD or colonoscopy.  Generally has BMs about once per week.   No ETOH.  PRN tylenol for headaches, up to 8 per day.  No hx jaundice.  Never seen by ID or GI regarding Hep C. Was discharged from [facility] on Methadone, she ran out of this.  She has used narcotics and marijuana since discharge but says last IVDA was before September surgery.   Grandfather, dad:   Cirrhosis Brother with hx jaundice.      Past Medical History  Diagnosis Date  . Bacterial endocarditis     a. s/p [facility] 06/2010 secondary to endocarditis related to IVDA; [facility]. b. re-do mitral valve replacement, tricuspid valve replacement, and aortic valve replacement (all bioprosthetic).  [name]. DJD (degenerative joint disease)   . Hepatitis C     history of  . Anemia   . Hypothyroidism   . Stroke   . Anxiety   . Mental disorder   . Chronic pain   . Noncompliance   . Polysubstance abuse   . Sepsis     a. 05/2010 with sepsis, vegegations on valve replacements, ARDS, VDRF, multiorgan failure with elevated cardiac enzymes, suffered a left fronto-parietal subarachnoid hemorrhage, pulmonary infarcts, ARF requiring brief hemodialysis. s/p perciardial MVR/TVR.  [name]. Atrial flutter     a. Post-op from valve replacement 06/2010.  [name]. CHB (complete heart block)     a. After valve replacement 09/2012 - pacer placement.  . Opiate abuse, episodic     Past Surgical History  Procedure Laterality Date  . Cholecystectomy    .  Tonsillectomy and adenoidectomy    . Valve replacement    . Appendectomy    . Coronary artery bypass graft      2012    [facility]          dr Jeannetta Nap  [place]  . Cardiac catheterization    . Cataract extraction    . Multiple extractions with alveoloplasty  09/01/2011    Procedure: MULTIPLE EXTRACION WITH ALVEOLOPLASTY;  Surgeon: Georgia Lopes, DDS;  Location: [facility] OR;  Service: Oral Surgery;  Laterality: N/A;    Prior to Admission medications   Medication Sig Start Date End Date Taking? Authorizing Provider  acetaminophen (TYLENOL) 500 MG tablet Take 1,000 mg by mouth every 6 (six) hours as needed for moderate pain.    Yes Historical Provider, MD  aspirin 81 MG chewable tablet Chew 81 mg by mouth daily.   Yes Historical Provider, MD  levothyroxine (SYNTHROID, LEVOTHROID) 75 MCG tablet Take 75 mcg by mouth daily before breakfast.    Yes Historical Provider, MD    Scheduled Meds: . gentamicin  60 mg Intravenous Q8H  . influenza vac split quadrivalent PF  0.5 mL Intramuscular Tomorrow-1000  . levothyroxine  75 mcg Oral QAC breakfast  . pantoprazole  40 mg Oral Daily  . sodium chloride  3 mL Intravenous Q12H  . vancomycin  1,000 mg Intravenous Q8H   Infusions: . sodium chloride     PRN Meds: acetaminophen, acetaminophen, alum & mag hydroxide-simeth, LORazepam, ondansetron (ZOFRAN) IV, ondansetron, oxyCODONE-acetaminophen, traMADol   Allergies as of 02/06/2013  . (No Known Allergies)    Family History  Problem Relation Age of Onset  . Cancer    . Coronary artery disease      History   Social History  . Marital Status: Legally Separated    Spouse Name: N/A    Number of Children: N/A  . Years of Education: N/A   Occupational History  . Not on file.   Social History Main Topics  . Smoking status: Current Every Day Smoker -- 1.00 packs/day for 30 years    Types: Cigarettes  . Smokeless tobacco: Not on file     Comment: 1 pack every 3 days - smoking vapor cigs   . Alcohol Use: No  . Drug Use: Yes    Special: Marijuana, Cocaine     Comment: prior IVDA, cocaine. + occasional narcotics  . Sexual Activity: Not on file   Other Topics Concern  . Not on file   Social History Narrative  . No narrative on file    REVIEW OF SYSTEMS: No nose bleeds No etoh No unusual bleeding Unable to read, finished GED Living with a friend 3 children all in their 7s. Insomnia     PHYSICAL EXAM: Vital signs in last 24 hours: Filed Vitals:   02/08/13 0845  BP: 91/47  Pulse: 75  Temp: 97.8 F (36.6 C)  Resp: 15   Wt Readings from Last 3 Encounters:  02/08/13 77.3 kg (170 lb 6.7 oz)  09/26/12 76.6 kg (168 lb 14 oz)  09/20/12 74.844 kg (165 lb)    General: looks chronically unwell Head:  No asymmetry   Eyes:  No icterus Ears:  Not HOH  Nose:  No discharge Mouth:  Clear oral mm.  Full upper  denture Neck:  No mass, no JVD Lungs:  Clear but diminished Heart: RRR.  Intact sternal scar Abdomen:  Pacemaker battery in the left abdomen.  Not tender, no organomegaly, obese.   Rectal:  deferred   Musc/Skeltl: no joint contracture or swelling Extremities:  No CCE  Neurologic:  Oriented x 3.  No limb weakness Skin:  No sores or rash, no telangectasia Tattoos:  Several crude tatoos on arms, trunk Nodes:  No cervical adenopathy   Psych:  Cooperative, relaxed.   Intake/Output from previous day: 01/26 0701 - 01/27 0700 In: 1753 [I.V.:1753] Out: 1400 [Urine:1400] Intake/Output this shift: Total I/O In: -  Out: 2000 [Urine:2000]  LAB RESULTS:  Recent Labs  02/06/13 1950 02/07/13 0350 02/08/13 0357  WBC 9.2 7.0 4.5  HGB 11.3* 10.2* 9.0*  HCT 33.2* 30.7* 27.0*  PLT 151 128* 94*   BMET Lab Results  Component Value Date   NA 135* 02/08/2013   NA 138 02/07/2013   NA 132* 02/06/2013   K 3.4* 02/08/2013   K 3.3* 02/07/2013   K 3.2* 02/06/2013   CL 106 02/08/2013   CL 105 02/07/2013   CL 97 02/06/2013   CO2 17* 02/08/2013   CO2 16* 02/07/2013   CO2 19 02/06/2013   GLUCOSE 147* 02/08/2013   GLUCOSE 85 02/07/2013   GLUCOSE 79 02/06/2013   BUN 6 02/08/2013   BUN 9 02/07/2013   BUN 10 02/06/2013   CREATININE 0.68 02/08/2013   CREATININE 0.65 02/07/2013   CREATININE 0.81 02/06/2013   CALCIUM 7.4* 02/08/2013   CALCIUM 7.8* 02/07/2013   CALCIUM 8.6 02/06/2013   LFT  Recent Labs  02/06/13 1950 02/07/13 0350  PROT 8.1 7.4  ALBUMIN 3.4* 2.9*  AST 12 10  ALT 7 6  ALKPHOS 93 87  BILITOT 0.8 0.9   PT/INR Lab Results  Component Value Date   INR 1.23 02/06/2013   INR 1.22 09/26/2012   INR 1.03 09/01/2011   Hepatitis Panel No results found for this basename: HEPBSAG, HCVAB, HEPAIGM, HEPBIGM,  in the last 72 hours C-Diff No components found with this basename: cdiff   Lipase     Component Value Date/Time   LIPASE 16 02/07/2013 0350    Drugs of Abuse     Component Value  Date/Time   LABOPIA NONE DETECTED 02/07/2013 0341   COCAINSCRNUR NONE DETECTED 02/07/2013 0341   LABBENZ NONE DETECTED 02/07/2013 0341   AMPHETMU NONE DETECTED 02/07/2013 0341   THCU POSITIVE* 02/07/2013 0341   LABBARB NONE DETECTED 02/07/2013 0341     RADIOLOGY STUDIES: Dg Chest 2 View  02/06/2013   CLINICAL DATA:  Chest pain  EXAM: CHEST  2 VIEW  COMPARISON:  02/02/2013  FINDINGS: Prior median sternotomy for multiple bowel replacements. Normal heart size. No CHF. Chronic appearing right base atelectasis versus scarring. No new airspace process, collapse or consolidation. No effusion or pneumothorax. Trachea midline. Stable exam.  IMPRESSION: Stable postoperative findings.  Negative for edema.  Chronic right base atelectasis versus scarring   Electronically Signed   By: Ruel Favors M.D.   On: 02/06/2013 21:17   Ct Angio Chest Aortic Dissect W &/or W/o  02/07/2013   CLINICAL DATA:  Pain, pacemaker firing  EXAM: CT ANGIOGRAPHY CHEST, ABDOMEN AND PELVIS  TECHNIQUE: Multidetector CT imaging through the chest, abdomen and pelvis was performed using the standard protocol during bolus administration of intravenous contrast. Multiplanar reconstructed images and MIPs were obtained and reviewed to evaluate the vascular anatomy.  CONTRAST:  OMNIPAQUE IOHEXOL 350 MG/ML SOLN  COMPARISON:  02/06/2013 radiograph  FINDINGS: CTA CHEST FINDINGS  Normal caliber aorta. Aortic, tricuspid and mitral valve replacements. Heart size upper normal to mildly enlarged. Left anterior abdomen subcutaneous  battery pack with lead tips projecting epicardial.  Respiratory motion degrades detailed evaluation. The central pulmonary arteries are patent. The more peripheral branches, particularly within the lower lobes, are obscured by motion.  Central airways are grossly patent. Linear opacity within the middle, lobe and right greater than left lower lobes. No pneumothorax.  Median sternotomy.  Review of the MIP images confirms the  above findings.  CTA ABDOMEN AND PELVIS FINDINGS  Abdominal organ evaluation is limited without contrast or in the arterial phase. Within this limitation, no appreciable abnormality of the liver. Distended gallbladder. No radiodense stones. No biliary ductal dilatation. No appreciable abnormality of the pancreas. Splenic granuloma and mild splenomegaly. Mild adreniform enlargement. Areas of right renal scarring. Mild areas of scarring on the left as well. No hydroureteronephrosis.  Limited bowel evaluation. No overt colitis. Appendix not identified. No right lower quadrant inflammation. No bowel obstruction. No free intraperitoneal air or fluid. No lymphadenopathy. Subcentimeter short axis retroperitoneal lymph nodes.  Thin walled bladder. No appreciable abnormality of the uterus and adnexa.  Scattered atherosclerosis of the aorta and branch vessels. Patent celiac axis, SMA, single renal arteries, IMA. Advanced atherosclerotic disease of the bilateral common iliac arteries. The internal and external iliac arteries remain patent as do the common femoral arteries and proximal profunda femoral and superficial femoral arteries.  Multilevel degenerative changes.  No acute osseous finding.  Review of the MIP images confirms the above findings.  IMPRESSION: Degraded by respiratory and cardiac motion. Central pulmonary arteries are patent. More peripheral branches are nondiagnostic.  Left anterior abdominal wall battery pack with epicardial leads.  Normal caliber aorta with scattered atherosclerotic disease. Aortic, mitral, tricuspid valve replacement.  Linear opacities within the right middle lobe and right greater than left lower lobes, favor atelectasis.  Areas of right greater than left renal scarring.  Mild splenomegaly at 14.5 cm.   Electronically Signed   By: Jearld Lesch M.D.   On: 02/07/2013 02:38   Ct Angio Abd/pel W/ And/or W/o  02/07/2013   CLINICAL DATA:  Pain, pacemaker firing  EXAM: CT ANGIOGRAPHY  CHEST, ABDOMEN AND PELVIS  TECHNIQUE: Multidetector CT imaging through the chest, abdomen and pelvis was performed using the standard protocol during bolus administration of intravenous contrast. Multiplanar reconstructed images and MIPs were obtained and reviewed to evaluate the vascular anatomy.  CONTRAST:  OMNIPAQUE IOHEXOL 350 MG/ML SOLN  COMPARISON:  02/06/2013 radiograph  FINDINGS: CTA CHEST FINDINGS  Normal caliber aorta. Aortic, tricuspid and mitral valve replacements. Heart size upper normal to mildly enlarged. Left anterior abdomen subcutaneous battery pack with lead tips projecting epicardial.  Respiratory motion degrades detailed evaluation. The central pulmonary arteries are patent. The more peripheral branches, particularly within the lower lobes, are obscured by motion.  Central airways are grossly patent. Linear opacity within the middle, lobe and right greater than left lower lobes. No pneumothorax.  Median sternotomy.  Review of the MIP images confirms the above findings.  CTA ABDOMEN AND PELVIS FINDINGS  Abdominal organ evaluation is limited without contrast or in the arterial phase. Within this limitation, no appreciable abnormality of the liver. Distended gallbladder. No radiodense stones. No biliary ductal dilatation. No appreciable abnormality of the pancreas. Splenic granuloma and mild splenomegaly. Mild adreniform enlargement. Areas of right renal scarring. Mild areas of scarring on the left as well. No hydroureteronephrosis.  Limited bowel evaluation. No overt colitis. Appendix not identified. No right lower quadrant inflammation. No bowel obstruction. No free intraperitoneal air or fluid. No lymphadenopathy. Subcentimeter short axis  retroperitoneal lymph nodes.  Thin walled bladder. No appreciable abnormality of the uterus and adnexa.  Scattered atherosclerosis of the aorta and branch vessels. Patent celiac axis, SMA, single renal arteries, IMA. Advanced atherosclerotic disease of  the bilateral common iliac arteries. The internal and external iliac arteries remain patent as do the common femoral arteries and proximal profunda femoral and superficial femoral arteries.  Multilevel degenerative changes.  No acute osseous finding.  Review of the MIP images confirms the above findings.  IMPRESSION: Degraded by respiratory and cardiac motion. Central pulmonary arteries are patent. More peripheral branches are nondiagnostic.  Left anterior abdominal wall battery pack with epicardial leads.  Normal caliber aorta with scattered atherosclerotic disease. Aortic, mitral, tricuspid valve replacement.  Linear opacities within the right middle lobe and right greater than left lower lobes, favor atelectasis.  Areas of right greater than left renal scarring.  Mild splenomegaly at 14.5 cm.   Electronically Signed   By: Jearld Lesch M.D.   On: 02/07/2013 02:38    ENDOSCOPIC STUDIES: none  IMPRESSION:   *  Gram + bacteremia in pt with hx endocarditis from IVDA and multiple cardiac valve surgeries.  *  Anemia, FOBT + but no groos bleeding.  *  Hepatitis C, diagnosed age 43 vs 38.  Per CT angio the liver is normal in appearance *  Splenomegaly.  *  ASPVD, bil common iliac disease.    PLAN:     *  Per Dr Marina Goodell.    Jennye Moccasin  02/08/2013, 10:24 AM Pager: [phone]  GI ATTENDING  History, laboratories, x-rays reviewed. Patient personally seen and examined. Agree with H&P as outlined above.  IMPRESSION 1. Anemia. Multifactorial. Change in hemoglobin dilutional from hydration.  2. Hemoccult-positive stool without overt GI bleeding. 3. Gram-positive bacteremia 4. History of bacterial endocarditis 5. Hepatitis C 6. Chronic drug abuse  RECOMMENDATION 1. Treat bacteremia as you're doing 2. Agree with ruling out recurrent endocarditis 3. Empiric PPI to protect upper GI mucosa 4. Stop drug use 5. Would consider GI workup of Hemoccult-positive stool as outpatient.  We're  available as needed for acute GI problems. Will sign off. Thank you  Wilhemina Bonito. Eda Keys., M.D. [facility] Division of Gastroenterology

## 2013-02-09 ENCOUNTER — Encounter (HOSPITAL_COMMUNITY)
Admission: EM | Disposition: A | Discharge status: Home Health | Payer: Self-pay | Source: Home / Self Care | Attending: Internal Medicine

## 2013-02-09 ENCOUNTER — Encounter (HOSPITAL_COMMUNITY): Payer: Self-pay | Admitting: Gastroenterology

## 2013-02-09 ENCOUNTER — Encounter: Payer: Self-pay | Admitting: Physician Assistant

## 2013-02-09 DIAGNOSIS — I369 Nonrheumatic tricuspid valve disorder, unspecified: Secondary | ICD-10-CM

## 2013-02-09 HISTORY — PX: TEE WITHOUT CARDIOVERSION: SHX5443

## 2013-02-09 LAB — CULTURE, BLOOD (ROUTINE X 2)

## 2013-02-09 LAB — CBC
HEMATOCRIT: 28.2 % — AB (ref 36.0–46.0)
Hemoglobin: 9.6 g/dL — ABNORMAL LOW (ref 12.0–15.0)
MCH: 27 pg (ref 26.0–34.0)
MCHC: 34 g/dL (ref 30.0–36.0)
MCV: 79.2 fL (ref 78.0–100.0)
Platelets: 128 10*3/uL — ABNORMAL LOW (ref 150–400)
RBC: 3.56 MIL/uL — AB (ref 3.87–5.11)
RDW: 17.6 % — ABNORMAL HIGH (ref 11.5–15.5)
WBC: 6.6 10*3/uL (ref 4.0–10.5)

## 2013-02-09 LAB — BASIC METABOLIC PANEL
BUN: 3 mg/dL — AB (ref 6–23)
CHLORIDE: 104 meq/L (ref 96–112)
CO2: 18 meq/L — AB (ref 19–32)
CREATININE: 0.75 mg/dL (ref 0.50–1.10)
Calcium: 8.4 mg/dL (ref 8.4–10.5)
GFR calc Af Amer: >90 mL/min (ref >90)
GFR calc non Af Amer: >90 mL/min (ref >90)
Glucose, Bld: 114 mg/dL — ABNORMAL HIGH (ref 70–99)
Potassium: 3.7 mEq/L (ref 3.7–5.3)
Sodium: 135 mEq/L — ABNORMAL LOW (ref 137–147)

## 2013-02-09 SURGERY — ECHOCARDIOGRAM, TRANSESOPHAGEAL
Anesthesia: Moderate Sedation

## 2013-02-09 MED ORDER — SODIUM CHLORIDE 0.9 % IV SOLN
2.0000 g | INTRAVENOUS | Status: DC
  Administered 2013-02-09 – 2013-02-14 (×31): 2 g via INTRAVENOUS
  Filled 2013-02-09 (×35): qty 2000

## 2013-02-09 MED ORDER — MIDAZOLAM HCL 10 MG/2ML IJ SOLN
INTRAMUSCULAR | Status: DC | PRN
  Administered 2013-02-09 (×2): 2 mg via INTRAVENOUS

## 2013-02-09 MED ORDER — BUTAMBEN-TETRACAINE-BENZOCAINE 2-2-14 % EX AERO
INHALATION_SPRAY | CUTANEOUS | Status: DC | PRN
  Administered 2013-02-09: 2 via TOPICAL

## 2013-02-09 MED ORDER — FENTANYL CITRATE 0.05 MG/ML IJ SOLN
INTRAMUSCULAR | Status: DC | PRN
  Administered 2013-02-09: 25 ug via INTRAVENOUS

## 2013-02-09 MED ORDER — FENTANYL CITRATE 0.05 MG/ML IJ SOLN
INTRAMUSCULAR | Status: AC
  Filled 2013-02-09: qty 2

## 2013-02-09 MED ORDER — MIDAZOLAM HCL 5 MG/ML IJ SOLN
INTRAMUSCULAR | Status: AC
  Filled 2013-02-09: qty 2

## 2013-02-09 MED ORDER — OXYCODONE HCL 5 MG PO TABS
10.0000 mg | ORAL_TABLET | ORAL | Status: DC | PRN
  Administered 2013-02-09 – 2013-02-14 (×25): 10 mg via ORAL
  Filled 2013-02-09 (×25): qty 2

## 2013-02-09 NOTE — CV Procedure (Signed)
Procedure: TEE  Indication: Enterococcus bacteremia  Sedation: Versed 4 mg IV, Fentanyl 25 mcg IV  Findings: Please see echo study for full report.  The patient has bioprosthetic aortic, mitral, and tricuspid valves.  The bioprosthetic mitral valve appeared to function normally with trivial MR and no vegetation.  The bioprosthetic tricuspid valve did not appear to have a vegetation but one of the leaflets appeared less mobile and there was moderate TR.  The mean gradient across the tricuspid valve was elevated at 8 mmHg suggesting possibility of a degree of stenosis.  The bioprosthetic aortic valve showed significant thickening along the leaflet tips.  I am concerned that this represents endocarditis, especially as the valve is relatively new (from 2014).  There was no significant aortic insufficiency.  Normal LV size and systolic function, EF 60%.  Probably normal RV size and systolic function but not seen well.  There were no pacemaker leads noted in the heart, suspect pacemaker is external.   Will discuss TEE with team.   Marca Ancona 02/09/2013 9:02 AM

## 2013-02-09 NOTE — Progress Notes (Addendum)
[facility]  [name] QPR:[id number] DOB: 1970-11-26 DOA: 02/06/2013 PCP: Provider Not In System  Brief narrative: 43 year old female patient with history of recurrent gram + cocci endocarditis. She previously had undergone bioprosthetic mitral tricuspid valve replacements but had recurrent endocarditis in Sept 2014 and subsequently underwent redo mitral and tricuspid valve replacements and also aortic valve replacement all with bioprosthetic devices. 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   She presented to the emergency department because of weakness and chest pain as well as dizziness. States had been ongoing for 4 days and felt like shocks. In the emergency department the patient's pacemaker was interrogated and was functioning appropriately.  After arrival to the ER she was also found to be hypotensive with systolic blood pressures in the 80s. 3 days prior patient has had blood work checked in the ER and now her hemoglobin had decreased by 3 g. Stool for occult blood was positive without any frank rectal bleeding or melena seen on rectal exam per ER physician. She subsequently received 1 L of normal saline. Because of the hypotension a sepsis workup was initiated so urine and blood cultures were obtained and influenza PCR was sent off. She was empirically started on vancomycin and Zosyn. Cardiologist was also consulted.  HPI/Subjective: Patient alert post TEE - still with anterior and epigastric CP- improves with meds but not resolved.  No new complaints.    Assessment/Plan:  SIRS / Enterococcus Bacteremia - 2/2 blood cx's positive - repeat blood cx's obtained 1/27 - pharmacy dosing anbx's - have adjusted anbx's to gent and vanco - ID to follow - TEE 1/28 showed thickening leaflets Ao valve concerning for endocarditis - poor IV access but too soon to pursue PICC/wait on FU cx's - allow  foot sticks  History of bacterial endocarditis / s/p recurrent bioprosthetic valve replacements -1st episode was MSSA, 2nd episode was strep viridans -blood cx's here positive for enterococcus(see above)- pt denies IVDA  Dehydration / Hypotension  -Continue IV fluids and boluses when necessary - improving  Chest pain, non-cardiac -Appreciate Cardiology assistance -Normal coronaries per cath 2012 therefore no additional ischemic evaluation indicated -When necessary Mylanta and Ultram and Percocet (see below) -Cont PPI (see below)  Chronic pain -pt admits was dc'd on Methadone after last admit at [facility] for valve repair -had been using sparingly due to inability to afford refill - ran out 2 weeks ago   Pulmonary HTN - 62 mmHg -Noted on echo prior to recent valve replacement  Anemia/Heme positive stool -Hemoglobin has decreased from 13 to 9 with previous readings in the fall of 2014 much higher in between 14 and 15 -consulted GI - rec OP evaluation -Anemia panel c/w low iron so consider IV dosing once bacteremia better controlled - no evidence of large volume gross blood loss   Hypokalemia -Oral replete  Hypothyroidism -TSH 25.123 - suspect noncompliance w/ medications  -Free T4 normal  Substance abuse  -UDS pos for THC only  DVT prophylaxis: SCDs Code Status: Full Family Communication: no family at bedside Disposition Plan/Expected LOS: Transfer to floor  Consultants: Cardiology ID GI  Antibiotics: Zosyn 1/25 Vancomycin 1/25 > 1/28 Gentamicin 1/26 >>> Ampicillin 1/28 >> Rifampin 1/26  Objective: Blood pressure 98/58, pulse 81, temperature 98.3 F (36.8 C), temperature source Oral, resp. rate 18, height $RemoveBe'5\' 4"'KlmIxjTxn$  (1.626 m), weight 166 lb 0.1 oz (75.3 kg), last menstrual period 02/05/2013, SpO2  94.00%.  Intake/Output Summary (Last 24 hours) at 02/09/13 1046 Last data filed at 02/09/13 0630  Gross per 24 hour  Intake 1700.33 ml  Output   2550 ml  Net -849.67 ml    Exam: General: No acute respiratory distress Lungs: Clear to auscultation bilaterally without wheezes or crackles, RA Cardiovascular: Regular rate and rhythm without gallop or rub, no peripheral edema or JVD Abdomen: Mildly tender epigastrum, nondistended, soft, bowel sounds positive, no rebound, no ascites, no appreciable mass Musculoskeletal: No significant cyanosis, clubbing of bilateral lower extremities Neurological: Alert and oriented x 3, moves all extremities x 4 without focal neurological deficits, CN 2-12 intact  Scheduled Meds:  Scheduled Meds: . ampicillin (OMNIPEN) IV  2 g Intravenous Q4H  . gentamicin  60 mg Intravenous Q8H  . levothyroxine  75 mcg Oral QAC breakfast  . pantoprazole  40 mg Oral Daily  . sodium chloride  3 mL Intravenous Q12H   Data Reviewed: Basic Metabolic Panel:  Recent Labs Lab 02/02/13 1334 02/06/13 1950 02/07/13 0350 02/08/13 0357 02/09/13 0419  NA 133* 132* 138 135* 135*  K 4.3 3.2* 3.3* 3.4* 3.7  CL 96 97 105 106 104  CO2 18* 19 16* 17* 18*  GLUCOSE 100* 79 85 147* 114*  BUN 13 10 9 6  3*  CREATININE 0.82 0.81 0.65 0.68 0.75  CALCIUM 9.6 8.6 7.8* 7.4* 8.4   Liver Function Tests:  Recent Labs Lab 02/06/13 1950 02/07/13 0350  AST 12 10  ALT 7 6  ALKPHOS 93 87  BILITOT 0.8 0.9  PROT 8.1 7.4  ALBUMIN 3.4* 2.9*    Recent Labs Lab 02/07/13 0350  LIPASE 16   CBC:  Recent Labs Lab 02/02/13 1334 02/06/13 1950 02/07/13 0350 02/08/13 0357 02/09/13 0419  WBC 12.4* 9.2 7.0 4.5 6.6  NEUTROABS  --  7.5 5.3  --   --   HGB 14.5 11.3* 10.2* 9.0* 9.6*  HCT 42.8 33.2* 30.7* 27.0* 28.2*  MCV 81.5 78.5 79.1 79.4 79.2  PLT 279 151 128* 94* 128*   Cardiac Enzymes:  Recent Labs Lab 02/06/13 1951 02/07/13 0350 02/07/13 1002 02/07/13 1625  TROPONINI <0.30 <0.30 <0.30 <0.30   BNP (last 3 results)  Recent Labs  09/25/12 0936 02/06/13 1951  PROBNP 6304.0* 1450.0*    Recent Results (from the past 240 hour(s))   CULTURE, BLOOD (ROUTINE X 2)     Status: None   Collection Time    02/06/13  8:20 PM      Result Value Range Status   Specimen Description BLOOD RIGHT ARM   Final   Special Requests BOTTLES DRAWN AEROBIC AND ANAEROBIC 10CC EA   Final   Culture  Setup Time     Final   Value: 02/07/2013 01:15     Performed at [facility]   Culture     Final   Value: ENTEROCOCCUS SPECIES     Note: COMBINATION THERAPY OF HIGH DOSE AMPICILLIN OR VANCOMYCIN, PLUS AN AMINOGLYCOSIDE, IS USUALLY INDICATED FOR SERIOUS ENTEROCOCCAL INFECTIONS.     Note: Gram Stain Report Called to,Read Back By and Verified With: STEPHANIE SHAW@1420  ON [id number] BY [facility]     Performed at [facility]   Report Status 02/09/2013 FINAL   Final   Organism ID, Bacteria ENTEROCOCCUS SPECIES   Final  CULTURE, BLOOD (ROUTINE X 2)     Status: None   Collection Time    02/06/13  8:30 PM      Result Value Range Status  Specimen Description BLOOD LEFT HAND   Final   Special Requests BOTTLES DRAWN AEROBIC ONLY 8CC   Final   Culture  Setup Time     Final   Value: 02/07/2013 01:15     Performed at [facility]   Culture     Final   Value: ENTEROCOCCUS SPECIES     Note: SUSCEPTIBILITIES PERFORMED ON PREVIOUS CULTURE WITHIN THE LAST 5 DAYS.     Note: CRITICAL RESULT CALLED TO, READ BACK BY AND VERIFIED WITH: STEPHANIE SHAW@1440  ON [id number] BY [facility]     Performed at [facility]   Report Status 02/09/2013 FINAL   Final  URINE CULTURE     Status: None   Collection Time    02/06/13 10:16 PM      Result Value Range Status   Specimen Description URINE, CATHETERIZED   Final   Special Requests Normal   Final   Culture  Setup Time     Final   Value: 02/07/2013 04:04     Performed at [place]     Final   Value: NO GROWTH     Performed at [facility]   Culture     Final   Value: NO GROWTH     Performed at [facility]   Report Status 02/08/2013 FINAL   Final  MRSA PCR  SCREENING     Status: None   Collection Time    02/07/13  2:28 AM      Result Value Range Status   MRSA by PCR NEGATIVE  NEGATIVE Final   Comment:            The GeneXpert MRSA Assay (FDA     approved for NASAL specimens     only), is one component of a     comprehensive MRSA colonization     surveillance program. It is not     intended to diagnose MRSA     infection nor to guide or     monitor treatment for     MRSA infections.  RESPIRATORY VIRUS PANEL     Status: None   Collection Time    02/07/13 12:15 PM      Result Value Range Status   Source - RVPAN NASAL SWAB   Corrected   Comment: CORRECTED ON 01/27 AT 2204: PREVIOUSLY REPORTED AS NASAL SWAB   Respiratory Syncytial Virus A NOT DETECTED   Final   Respiratory Syncytial Virus B NOT DETECTED   Final   Influenza A NOT DETECTED   Final   Influenza B NOT DETECTED   Final   Parainfluenza 1 NOT DETECTED   Final   Parainfluenza 2 NOT DETECTED   Final   Parainfluenza 3 NOT DETECTED   Final   Metapneumovirus NOT DETECTED   Final   Rhinovirus NOT DETECTED   Final   Adenovirus NOT DETECTED   Final   Influenza A H1 NOT DETECTED   Final   Influenza A H3 NOT DETECTED   Final   Comment: (NOTE)           Normal Reference Range for each Analyte: NOT DETECTED     Testing performed using the Luminex xTAG Respiratory Viral Panel test     kit.     This test was developed and its performance characteristics determined     by [facility]. It has not been cleared or approved by the [place]     Food and  Drug Administration. This test is used for clinical purposes.     It should not be regarded as investigational or for research. This     laboratory is certified under the [place] (CLIA) as qualified to perform high complexity     clinical laboratory testing.     Performed at [facility], BLOOD (ROUTINE X 2)     Status: None   Collection Time    02/08/13 10:53 AM       Result Value Range Status   Specimen Description BLOOD RIGHT ARM   Final   Special Requests BOTTLES DRAWN AEROBIC AND ANAEROBIC 10CC   Final   Culture  Setup Time     Final   Value: 02/08/2013 16:31     Performed at [facility]   Culture     Final   Value:        BLOOD CULTURE RECEIVED NO GROWTH TO DATE CULTURE WILL BE HELD FOR 5 DAYS BEFORE ISSUING A FINAL NEGATIVE REPORT     Performed at [facility]   Report Status PENDING   Incomplete  CULTURE, BLOOD (ROUTINE X 2)     Status: None   Collection Time    02/08/13 10:54 AM      Result Value Range Status   Specimen Description BLOOD LEFT HAND   Final   Special Requests     Final   Value: BOTTLES DRAWN AEROBIC AND ANAEROBIC 10CC BLUE  5CC RED   Culture  Setup Time     Final   Value: 02/08/2013 16:31     Performed at [facility]   Culture     Final   Value:        BLOOD CULTURE RECEIVED NO GROWTH TO DATE CULTURE WILL BE HELD FOR 5 DAYS BEFORE ISSUING A FINAL NEGATIVE REPORT     Performed at [facility]   Report Status PENDING   Incomplete     Studies:  Recent x-ray studies have been reviewed in detail by the Attending Physician  Time spent : 90mins     Allison Ellis, ANP [facility] Office  [phone] Pager [phone]  **If unable to reach the above provider after paging please contact the [employer] @ [phone]  On-Call/Text Page:      [email or web address]      password [email or web address]  If 7PM-7AM, please contact night-coverage [email or web address] Password [email or web address] 02/09/2013, 10:46 AM   LOS: 3 days   I have personally examined this patient and reviewed the entire database. I have reviewed the above note, made any necessary editorial changes, and agree with its content.  Cherene Altes, MD [facility]

## 2013-02-09 NOTE — Progress Notes (Signed)
S* Echocardiogram Echocardiogram Transesophageal has been performed.  Margreta Journey 02/09/2013, 12:02 PM

## 2013-02-09 NOTE — Progress Notes (Addendum)
SUBJECTIVE: Continues to have sharp, stabbing pain lateral left chest wall, continuous, worsened with deep inspiration.   BP 108/60  Pulse 88  Temp(Src) 98.8 F (37.1 C) (Oral)  Resp 14  Ht 5\' 4"  (1.626 m)  Wt 166 lb 0.1 oz (75.3 kg)  BMI 28.48 kg/m2  SpO2 97%  LMP 02/05/2013  Intake/Output Summary (Last 24 hours) at 02/09/13 [id number] Last data filed at 02/09/13 0630  Gross per 24 hour  Intake 1483.33 ml  Output   4550 ml  Net -3066.67 ml    PHYSICAL EXAM General: Well developed, well nourished, in no acute distress. Alert and oriented x 3.  Psych:  Good affect, responds appropriately Neck: No JVD. No masses noted.  Lungs: Clear bilaterally with no wheezes or rhonci noted.  Heart: RRR with no murmurs noted. Abdomen: Bowel sounds are present. Soft, non-tender.  Extremities: No lower extremity edema.   LABS: Basic Metabolic Panel:  Recent Labs  96/04/5399/27/15 0357 02/09/13 0419  NA 135* 135*  K 3.4* 3.7  CL 106 104  CO2 17* 18*  GLUCOSE 147* 114*  BUN 6 3*  CREATININE 0.68 0.75  CALCIUM 7.4* 8.4   CBC:  Recent Labs  02/06/13 1950 02/07/13 0350 02/08/13 0357 02/09/13 0419  WBC 9.2 7.0 4.5 6.6  NEUTROABS 7.5 5.3  --   --   HGB 11.3* 10.2* 9.0* 9.6*  HCT 33.2* 30.7* 27.0* 28.2*  MCV 78.5 79.1 79.4 79.2  PLT 151 128* 94* 128*   Cardiac Enzymes:  Recent Labs  02/07/13 0350 02/07/13 1002 02/07/13 1625  TROPONINI <0.30 <0.30 <0.30   Current Meds: . gentamicin  60 mg Intravenous Q8H  . levothyroxine  75 mcg Oral QAC breakfast  . pantoprazole  40 mg Oral Daily  . sodium chloride  3 mL Intravenous Q12H  . vancomycin  1,000 mg Intravenous Q8H   ASSESSMENT AND PLAN: 43 yo female with history of IV drug abuse, TVR and MVR followed by endocarditis and then redo TVR, MVR and AVR at [facility] with permanent pacemaker in place (epicardial pacemaker leads) who is admitted with sepsis, positive blood cultures for enterococcus.   1. Chest pain: Her  pain is atypical. Worsened with inspiration. CTA chest 02/07/13 without evidence of PE in large vessels. Cardiac markers are negative x 3. She is known to have normal coronary arteries by cath 2012 per records. Chronic pain syndrome. No ischemic evaluation at this time.   2. S/p Mitral valve replacement, Triscuspid valve replacement, aortic valve replacement: Most recent procedures September [facility]. Denies IV drug use since then. UDS only positive for Thc. Presenting with fevers, abdominal pain, nausea, possibly c/w sepsis. Blood cultures are positive for enterococcus.  Echo (TTE) with normal LV function. No significant valvular regurgitation but with positive blood cultures, history of IV drug abuse with bioprosthetic valve replacement in the tricuspid, aortic and mitral position. She has epicardial placement of pacemaker leads. Will need to arrange TEE to exclude valve vegetations. TEE this am.    3. Sepsis/Enterococcus bacteremia: Antibiotics per primary team/ID.   Addendum: 02/09/13 at 9:55am: TEE this am. The bioprosthetic mitral valve appeared to function normally with trivial MR and no vegetation. The bioprosthetic tricuspid valve did not appear to have a vegetation but one of the leaflets appeared less mobile and there was moderate TR. The mean gradient across the tricuspid valve was elevated at 8 mmHg suggesting possibility of a degree of stenosis. The bioprosthetic aortic valve showed significant  thickening along the leaflet tips. I am concerned that this represents endocarditis, especially as the valve is relatively new (from 2014). There was no significant aortic insufficiency. Normal LV size and systolic function, EF 60%. Probably normal RV size and systolic function but not seen well. There were no pacemaker leads noted in the heart -Will continue IV antibiotics for now. Will need to involve CT surgery to get their opinion but pt will likely need to be managed with long term antibiotics and  then have repeat TEE. The TEE findings are non-specific though concerning for endocarditis. She would be high risk for repeat operative procedure as she has now had two open sternotomy procedures.     Oval Cavazos  1/28/20156:52 AM

## 2013-02-09 NOTE — Progress Notes (Signed)
TCTS BRIEF PROGRESS NOTE   Patient seen and examined, chart and TEE reviewed.  Full consult note to follow.  I agree with Dr. Alford Highland impression that the findings are consistent with PVE.  However, there are no indications for surgery at this time.  Under the circumstances, third time redo surgery would only be considered as a last option if the patient develops clear complications of PVE despite LONG TERM antibiotic therapy.  I would otherwise recommend continuing antibiotic therapy indefinitely until there are no signs/symptoms of persistent PVE.  Tam Savoia H 02/09/2013 5:30 PM

## 2013-02-09 NOTE — H&P (View-Only) (Signed)
     SUBJECTIVE: Continues to have sharp, stabbing pain lateral left chest wall, continuous, worsened with deep inspiration.   BP 108/60  Pulse 88  Temp(Src) 98.8 F (37.1 C) (Oral)  Resp 14  Ht 5\' 4"  (1.626 m)  Wt 166 lb 0.1 oz (75.3 kg)  BMI 28.48 kg/m2  SpO2 97%  LMP 02/05/2013  Intake/Output Summary (Last 24 hours) at 02/09/13 5397 Last data filed at 02/09/13 0630  Gross per 24 hour  Intake 1483.33 ml  Output   4550 ml  Net -3066.67 ml    PHYSICAL EXAM General: Well developed, well nourished, in no acute distress. Alert and oriented x 3.  Psych:  Good affect, responds appropriately Neck: No JVD. No masses noted.  Lungs: Clear bilaterally with no wheezes or rhonci noted.  Heart: RRR with no murmurs noted. Abdomen: Bowel sounds are present. Soft, non-tender.  Extremities: No lower extremity edema.   LABS: Basic Metabolic Panel:  Recent Labs  67/34/19 0357 02/09/13 0419  NA 135* 135*  K 3.4* 3.7  CL 106 104  CO2 17* 18*  GLUCOSE 147* 114*  BUN 6 3*  CREATININE 0.68 0.75  CALCIUM 7.4* 8.4   CBC:  Recent Labs  02/06/13 1950 02/07/13 0350 02/08/13 0357 02/09/13 0419  WBC 9.2 7.0 4.5 6.6  NEUTROABS 7.5 5.3  --   --   HGB 11.3* 10.2* 9.0* 9.6*  HCT 33.2* 30.7* 27.0* 28.2*  MCV 78.5 79.1 79.4 79.2  PLT 151 128* 94* 128*   Cardiac Enzymes:  Recent Labs  02/07/13 0350 02/07/13 1002 02/07/13 1625  TROPONINI <0.30 <0.30 <0.30   Current Meds: . gentamicin  60 mg Intravenous Q8H  . levothyroxine  75 mcg Oral QAC breakfast  . pantoprazole  40 mg Oral Daily  . sodium chloride  3 mL Intravenous Q12H  . vancomycin  1,000 mg Intravenous Q8H   ASSESSMENT AND PLAN: 43 yo female with history of IV drug abuse, TVR and MVR followed by endocarditis and then redo TVR, MVR and AVR at [facility] September 2014 with permanent pacemaker in place who is admitted with sepsis, positive blood cultures for enterococcus.   1. Chest pain: Her pain is atypical. Worsened with  inspiration. CTA chest 02/07/13 without evidence of PE in large vessels. Cardiac markers are negative x 3. She is known to have normal coronary arteries by cath 2012 per records. Chronic pain syndrome. No ischemic evaluation at this time.   2. S/p Mitral valve replacement, Triscuspid valve replacement, aortic valve replacement: Most recent procedures September 2014 at [facility]. Denies IV drug use since then. UDS only positive for Thc. Presenting with fevers, abdominal pain, nausea, possibly c/w sepsis. Blood cultures are positive for enterococcus.  Echo (TTE) with normal LV function. No significant valvular regurgitation but with positive blood cultures, history of IV drug abuse with bioprosthetic valve replacement in the tricuspid, aortic and mitral position and pacemaker leads, will need to arrange TEE to exclude lead or valve vegetations. TEE this am.    3. Sepsis/Enterococcus bacteremia: Antibiotics per primary team/ID.      MCALHANY,CHRISTOPHER  1/28/20156:52 AM

## 2013-02-09 NOTE — Progress Notes (Signed)
[facility]    Date of Admission:  02/06/2013   Total days of antibiotics 4        Day 3 gent        Day 4 vanco           ID: [name] is a 43 y.o. 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 Active Problems:   Hypothyroidism   Tricuspid regurgitation   Substance abuse   History of bacterial endocarditis   Anemia   SIRS (systemic inflammatory response syndrome)   Dehydration   Chest pain, non-cardiac   Heme positive stool   Pulmonary HTN/62 mmHg   Hypotension   Hypokalemia   Gram-positive bacteremia    Subjective: Still has subjective chills. Having pain  Underwent TEE this morning showing findings concerning for PVE:  The bioprosthetic tricuspid valve did not appear to have a vegetation but one of the leaflets appeared less mobile and there was moderate TR. The mean gradient across the tricuspid valve was elevated at 8 mmHg suggesting possibility of a degree of stenosis. The bioprosthetic aortic valve showed significant thickening along the leaflet tips. I am concerned that this represents endocarditis, especially as the valve is relatively new (from 2014). . There were no pacemaker leads noted in the heart, suspect pacemaker is external.    Medications:  . ampicillin (OMNIPEN) IV  2 g Intravenous Q4H  . gentamicin  60 mg Intravenous Q8H  . levothyroxine  75 mcg Oral QAC breakfast  . pantoprazole  40 mg Oral Daily  . sodium chloride  3 mL Intravenous Q12H    Objective: Vital signs in last 24 hours: Temp:  [98.2 F (36.8 C)-99.3 F (37.4 C)] 98.3 F (36.8 C) (01/28 0738) Pulse Rate:  [78-105] 81 (01/28 0927) Resp:  [14-27] 18 (01/28 0927) BP: (83-116)/(50-69) 98/58 mmHg (01/28 0927) SpO2:  [92 %-100 %] 94 % (01/28 0927) Weight:  [166 lb 0.1 oz (75.3 kg)] 166 lb  0.1 oz (75.3 kg) (01/28 0535) Constitutional: oriented to person, place, and time. appears well-developed and well-nourished. No distress.  HENT:  Mouth/Throat: Oropharynx is clear and moist. No oropharyngeal exudate.  Cardiovascular: Normal rate, regular rhythm and normal heart sounds. Exam reveals no gallop and no friction rub. 2/6 flow murmur  Pulmonary/Chest: Effort normal and breath sounds normal. No respiratory distress. has no wheezes.  Chest wall: sternotomy scar  Abdominal: Soft. Bowel sounds are normal. exhibits no distension. There is no tenderness.  Lymphadenopathy: no cervical adenopathy.  Neurological: alert and oriented to person, place, and time.  Skin: Skin is warm and dry. No rash noted. No erythema.  Psychiatric: a normal mood and affect. behavior is normal.    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 - please wait until blood cx from 1/27 show no growth for 48-72hr before placing picc line - she will need 6 wks of IV antibiotics as well as follow up. We will seen her back in ID clinic in 2 and 6 wks but also have her follow up with her cardiologists at end of course of therapy.  Drue [facility] Cell: [phone] Pager: [phone]  02/09/2013, 10:19 AM

## 2013-02-09 NOTE — Interval H&P Note (Signed)
History and Physical Interval Note:  02/09/2013 8:18 AM  [name]  has presented today for surgery, with the diagnosis of VEGATATION  The various methods of treatment have been discussed with the patient and family. After consideration of risks, benefits and other options for treatment, the patient has consented to  Procedure(s): TRANSESOPHAGEAL ECHOCARDIOGRAM (TEE) (N/A) as a surgical intervention .  The patient's history has been reviewed, patient examined, no change in status, stable for surgery.  I have reviewed the patient's chart and labs.  Questions were answered to the patient's satisfaction.     Menna Abeln [employer]

## 2013-02-10 ENCOUNTER — Encounter (HOSPITAL_COMMUNITY): Payer: Self-pay | Admitting: Cardiology

## 2013-02-10 DIAGNOSIS — B952 Enterococcus as the cause of diseases classified elsewhere: Secondary | ICD-10-CM

## 2013-02-10 DIAGNOSIS — T827XXA Infection and inflammatory reaction due to other cardiac and vascular devices, implants and grafts, initial encounter: Principal | ICD-10-CM

## 2013-02-10 DIAGNOSIS — I330 Acute and subacute infective endocarditis: Secondary | ICD-10-CM

## 2013-02-10 LAB — BASIC METABOLIC PANEL
BUN: 3 mg/dL — AB (ref 6–23)
CO2: 19 mEq/L (ref 19–32)
Calcium: 8.7 mg/dL (ref 8.4–10.5)
Chloride: 105 mEq/L (ref 96–112)
Creatinine, Ser: 0.73 mg/dL (ref 0.50–1.10)
Glucose, Bld: 120 mg/dL — ABNORMAL HIGH (ref 70–99)
Potassium: 3.3 mEq/L — ABNORMAL LOW (ref 3.7–5.3)
Sodium: 139 mEq/L (ref 137–147)

## 2013-02-10 LAB — CBC
HEMATOCRIT: 29.1 % — AB (ref 36.0–46.0)
Hemoglobin: 9.7 g/dL — ABNORMAL LOW (ref 12.0–15.0)
MCH: 26.4 pg (ref 26.0–34.0)
MCHC: 33.3 g/dL (ref 30.0–36.0)
MCV: 79.1 fL (ref 78.0–100.0)
Platelets: 162 10*3/uL (ref 150–400)
RBC: 3.68 MIL/uL — ABNORMAL LOW (ref 3.87–5.11)
RDW: 17.6 % — AB (ref 11.5–15.5)
WBC: 8.6 10*3/uL (ref 4.0–10.5)

## 2013-02-10 MED ORDER — POTASSIUM CHLORIDE CRYS ER 20 MEQ PO TBCR
60.0000 meq | EXTENDED_RELEASE_TABLET | Freq: Once | ORAL | Status: AC
  Administered 2013-02-10: 60 meq via ORAL
  Filled 2013-02-10: qty 3

## 2013-02-10 MED ORDER — PANTOPRAZOLE SODIUM 40 MG PO TBEC
40.0000 mg | DELAYED_RELEASE_TABLET | Freq: Two times a day (BID) | ORAL | Status: DC
  Administered 2013-02-10 – 2013-02-14 (×9): 40 mg via ORAL
  Filled 2013-02-10 (×8): qty 1

## 2013-02-10 MED ORDER — DICLOFENAC SODIUM 1 % TD GEL
2.0000 g | Freq: Four times a day (QID) | TRANSDERMAL | Status: DC
  Administered 2013-02-10: 2 g via TOPICAL
  Filled 2013-02-10: qty 100

## 2013-02-10 NOTE — Progress Notes (Signed)
Patient has a temp of 101.9,tylenol given as ordered.K. Schorr,NP notified,no new orders received. Natilee Gauer Joselita,RN

## 2013-02-10 NOTE — Progress Notes (Addendum)
[facility]    Date of Admission:  02/06/2013   Total days of antibiotics 5        Day 4 gent        Day 2 amp           ID: [name] is a 43 y.o. 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 Active Problems:   Hypothyroidism   Tricuspid regurgitation   Substance abuse   History of bacterial endocarditis   Anemia   SIRS (systemic inflammatory response syndrome)   Dehydration   Chest pain, non-cardiac   Heme positive stool   Pulmonary HTN/62 mmHg   Hypotension   Hypokalemia   Gram-positive bacteremia    Subjective: Still having pleuritic chest pain.  Last night seen by Dr. Ashley Mariner, who agreed with Dr. Shirlee Latch assessment of TEE, highly concerning for recurrent PVE. Patient does not need surgery at this time, but mentioned indefinite antibiotic course.       Medications:  . ampicillin (OMNIPEN) IV  2 g Intravenous Q4H  . gentamicin  60 mg Intravenous Q8H  . levothyroxine  75 mcg Oral QAC breakfast  . pantoprazole  40 mg Oral BID  . sodium chloride  3 mL Intravenous Q12H    Objective: Vital signs in last 24 hours: Temp:  [98.2 F (36.8 C)-98.7 F (37.1 C)] 98.4 F (36.9 C) (01/29 0810) Pulse Rate:  [82-110] 90 (01/29 0810) Resp:  [13-25] 20 (01/29 0810) BP: (94-153)/(52-95) 105/71 mmHg (01/29 0810) SpO2:  [94 %-100 %] 96 % (01/29 0810) Weight:  [166 lb 0.1 oz (75.3 kg)] 166 lb 0.1 oz (75.3 kg) (01/29 0425) Constitutional: oriented to person, place, and time. appears well-developed and well-nourished. No distress.  HENT:  Mouth/Throat: Oropharynx is clear and moist. No oropharyngeal exudate.  Cardiovascular: Normal rate, regular rhythm and normal heart sounds. Exam reveals no gallop and no friction rub. 2/6 flow murmur  Pulmonary/Chest: Effort normal and breath  sounds normal. No respiratory distress. has no wheezes.  Chest wall: sternotomy scar  Abdominal: Soft. Bowel sounds are normal. exhibits no distension. There is no tenderness.  Lymphadenopathy: no cervical adenopathy.  Neurological: alert and oriented to person, place, and time.  Skin: Skin is warm and dry. No rash noted. No erythema.  Psychiatric: a normal mood and affect. behavior is normal.    Lab Results  Recent Labs  02/09/13 0419 02/10/13 0326  WBC 6.6 8.6  HGB 9.6* 9.7*  HCT 28.2* 29.1*  NA 135* 139  K 3.7 3.3*  CL 104 105  CO2 18* 19  BUN 3* 3*  CREATININE 0.75 0.73    Microbiology: 1/25 blood cx enterococcus Amp S 1/27 blood cx NGTD  Imaging: TEE The bioprosthetic tricuspid valve did not appear to have a vegetation but one of the leaflets appeared less mobile and there was moderate TR. The mean gradient across the tricuspid valve was elevated at 8 mmHg suggesting possibility of a degree of stenosis. The bioprosthetic aortic valve showed significant thickening along the leaflet tips. I am concerned that this represents endocarditis, especially as the valve is relatively new (from 2014). . There were no pacemaker leads noted in the heart, suspect pacemaker is external.   Assessment/Plan: 43yo F with prior episode of MSSA NVE, strep viridans PVE s/p redo MV-TV replacement now  has enterococcal PVE, bacteremia cleared as of 1/27  - continue ampicillin 2gm IV Q 4hr plus gentamicin, since gent trough today < 1. - can place picc line tomorrow if cx from 1/27 remain negative -  will need 6 wks of IV antibiotics as well as follow up.  - We will seen her back in ID clinic in 2 and 6 wks but also have her follow up with her cardiologists at end of course of therapy. - home health will need to do weekly bmp, cbc with diff, and twice a week gent trough in the first 2 wks. She will need weekly picc line dressing changes  - patient is concerned that she is unable to afford care/iv  antibiotics since no health insurance. Will have social worker start or inquire into medicaid status. She will need coverage to get access to meds.  Dr .Luciana Axeomer to see patient tomorrow  Drue SecondSNIDER, [facility] Cell: [phone] Pager: [phone]  02/10/2013, 9:32 AM

## 2013-02-10 NOTE — Progress Notes (Signed)
Pt admitted to unit 6 [place] from [facility]; received report from [place], [place]. Pt is alert and oriented to staff, call Riddle, and room. Bed in lowest position. Call Rought within reach. Full assessment to Epic. Will continue to monitor.

## 2013-02-10 NOTE — Progress Notes (Signed)
[facility]  CARLISE STOFER [name] DOB: 09-16-1970 DOA: 02/06/2013 PCP: Provider Not In System  Brief narrative: 43 year old female patient with history of recurrent gram + cocci endocarditis. She previously had undergone bioprosthetic mitral tricuspid valve replacements but had recurrent endocarditis in Sept 2014 and subsequently underwent redo mitral and tricuspid valve replacements and also aortic valve replacement all with bioprosthetic devices. 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   She presented to the emergency department because of weakness and chest pain as well as dizziness. States had been ongoing for 4 days and felt like shocks. In the emergency department the patient's pacemaker was interrogated and was functioning appropriately.  After arrival to the ER she was also found to be hypotensive with systolic blood pressures in the 80s. 3 days prior patient has had blood work checked in the ER and now her hemoglobin had decreased by 3 g. Stool for occult blood was positive without any frank rectal bleeding or melena seen on rectal exam per ER physician. She subsequently received 1 L of normal saline. Because of the hypotension a sepsis workup was initiated so urine and blood cultures were obtained and influenza PCR was sent off. She was empirically started on vancomycin and Zosyn. Cardiologist was also consulted.  HPI/Subjective: Patient alert post TEE - still with anterior and epigastric CP- improves with meds but not resolved.  No new complaints.    Assessment/Plan:  SIRS / Enterococcus Bacteremia - 2/2 blood cx's positive - repeat blood cx's obtained 1/27- per ID if FU cx's remain negative as of 1/30 then can place PICC - pharmacy dosing anbx's - have adjusted anbx's to gent and vanco - ID to follow - TEE 1/28 showed thickening leaflets Ao valve concerning for endocarditis -  poor IV access but too soon to pursue PICC/wait on FU cx's - allowed foot sticks  History of bacterial endocarditis / s/p recurrent bioprosthetic valve replacements -1st episode was MSSA, 2nd episode was strep viridans -blood cx's here positive for enterococcus(see above)- pt denies IVDA -per ID: * continue ampicillin 2gm IV Q 4hr plus gentamicin, since gent trough today < 1.  * can place picc line tomorrow if cx from 1/27 remain negative  * will need 6 wks of IV antibiotics as well as follow up.  * We will seen her back in ID clinic in 2 and 6 wks but also have her follow up with her cardiologists at end of course of therapy  Dehydration / Hypotension -resolved  -Continue IV fluids   Chest pain, non-cardiac -Appreciate Cardiology assistance -Normal coronaries per cath 2012 therefore no additional ischemic evaluation indicated -When necessary Mylanta and Ultram and Percocet (see below) -Cont PPI (see below) -exam c/w musc-skel pain but also epigastric pain concerning for GI etiology -add Voltaren gel  ?? Chronic pain vs post op pain -pt admits was dc'd on Methadone after last admit at [facility] for valve repair -had been using sparingly due to inability to afford refill - ran out 2 weeks ago   Pulmonary HTN - 62 mmHg -Noted on echo prior to recent valve replacement  Anemia/Heme positive stool -Hemoglobin had decreased from 13 to 9 with previous readings in the fall of 2014 much higher in between 14 and 15 -currently stable at 9.7 -consulted GI - rec OP evaluation -Anemia panel c/w low iron so consider IV dosing once bacteremia better controlled - no evidence  of large volume gross blood loss   Hypokalemia -Oral replete  Hypothyroidism -TSH 25.123 - suspect noncompliance w/ medications  -Free T4 normal  Substance abuse  -UDS pos for THC only  DVT prophylaxis: SCDs Code Status: Full Family Communication: no family at bedside Disposition Plan/Expected LOS: Transfer to  floor  Consultants: Cardiology ID GI  Antibiotics: Zosyn 1/25 >> 1/26 Vancomycin 1/25 > 1/28 Gentamicin 1/26 >>> Ampicillin 1/28 >> Rifampin 1/26 >>1/27  Objective: Blood pressure 106/63, pulse 90, temperature 98 F (36.7 C), temperature source Oral, resp. rate 19, height 5' 4" (1.626 m), weight 166 lb 0.1 oz (75.3 kg), last menstrual period 02/05/2013, SpO2 97.00%.  Intake/Output Summary (Last 24 hours) at 02/10/13 1110 Last data filed at 02/10/13 1012  Gross per 24 hour  Intake    901 ml  Output   3650 ml  Net  -2749 ml   Exam: General: No acute respiratory distress Lungs: Clear to auscultation bilaterally without wheezes or crackles, RA Cardiovascular: Regular rate and rhythm without gallop or rub, no peripheral edema or JVD Abdomen: Mildly tender epigastrum, nondistended, soft, bowel sounds positive, no rebound, no ascites, no appreciable mass Musculoskeletal: No significant cyanosis, clubbing of bilateral lower extremities-tender to palpation over anterior chest wall esp left chest and between shoulder blades Neurological: Alert and oriented x 3, moves all extremities x 4 without focal neurological deficits, CN 2-12 intact  Scheduled Meds:  Scheduled Meds: . ampicillin (OMNIPEN) IV  2 g Intravenous Q4H  . gentamicin  60 mg Intravenous Q8H  . levothyroxine  75 mcg Oral QAC breakfast  . pantoprazole  40 mg Oral BID  . sodium chloride  3 mL Intravenous Q12H   Data Reviewed: Basic Metabolic Panel:  Recent Labs Lab 02/06/13 1950 02/07/13 0350 02/08/13 0357 02/09/13 0419 02/10/13 0326  NA 132* 138 135* 135* 139  K 3.2* 3.3* 3.4* 3.7 3.3*  CL 97 105 106 104 105  CO2 19 16* 17* 18* 19  GLUCOSE 79 85 147* 114* 120*  BUN _0 3* 3*  CREATININE 0.81 0.65 0.68 0.75 0.73  CALCIUM 8.6 7.8* 7.4* 8.4 8.7   Liver Function Tests:  Recent Labs Lab 02/06/13 1950 02/07/13 0350  AST 12 10  ALT 7 6  ALKPHOS 93 87  BILITOT 0.8 0.9  PROT 8.1 7.4  ALBUMIN 3.4* 2.9*     Recent Labs Lab 02/07/13 0350  LIPASE 16   CBC:  Recent Labs Lab 02/06/13 1950 02/07/13 0350 02/08/13 0357 02/09/13 0419 02/10/13 0326  WBC 9.2 7.0 4.5 6.6 8.6  NEUTROABS 7.5 5.3  --   --   --   HGB 11.3* 10.2* 9.0* 9.6* 9.7*  HCT 33.2* 30.7* 27.0* 28.2* 29.1*  MCV 78.5 79.1 79.4 79.2 79.1  PLT 151 128* 94* 128* 162   Cardiac Enzymes:  Recent Labs Lab 02/06/13 1951 02/07/13 0350 02/07/13 1002 02/07/13 1625  TROPONINI <0.30 <0.30 <0.30 <0.30   BNP (last 3 results)  Recent Labs  09/25/12 0936 02/06/13 1951  PROBNP 6304.0* 1450.0*    Recent Results (from the past 240 hour(s))  CULTURE, BLOOD (ROUTINE X 2)     Status: None   Collection Time    02/06/13  8:20 PM      Result Value Range Status   Specimen Description BLOOD RIGHT ARM   Final   Special Requests BOTTLES DRAWN AEROBIC AND ANAEROBIC 10CC EA   Final   Culture  Setup Time     Final   Value: 02/07/2013  01:15     Performed at [facility]     Final   Value: ENTEROCOCCUS SPECIES     Note: COMBINATION THERAPY OF HIGH DOSE AMPICILLIN OR VANCOMYCIN, PLUS AN AMINOGLYCOSIDE, IS USUALLY INDICATED FOR SERIOUS ENTEROCOCCAL INFECTIONS.     Note: Gram Stain Report Called to,Read Back By and Verified With: STEPHANIE [email or web address]  ON [id number] BY [facility]     Performed at [facility]   Report Status 02/09/2013 FINAL   Final   Organism ID, Bacteria ENTEROCOCCUS SPECIES   Final  CULTURE, BLOOD (ROUTINE X 2)     Status: None   Collection Time    02/06/13  8:30 PM      Result Value Range Status   Specimen Description BLOOD LEFT HAND   Final   Special Requests BOTTLES DRAWN AEROBIC ONLY 8CC   Final   Culture  Setup Time     Final   Value: 02/07/2013 01:15     Performed at [facility]   Culture     Final   Value: ENTEROCOCCUS SPECIES     Note: SUSCEPTIBILITIES PERFORMED ON PREVIOUS CULTURE WITHIN THE LAST 5 DAYS.     Note: CRITICAL RESULT CALLED TO, READ BACK BY AND VERIFIED WITH:  STEPHANIE [email or web address]  ON [id number] BY [facility]     Performed at [facility]   Report Status 02/09/2013 FINAL   Final  URINE CULTURE     Status: None   Collection Time    02/06/13 10:16 PM      Result Value Range Status   Specimen Description URINE, CATHETERIZED   Final   Special Requests Normal   Final   Culture  Setup Time     Final   Value: 02/07/2013 04:04     Performed at [place]     Final   Value: NO GROWTH     Performed at [facility]   Culture     Final   Value: NO GROWTH     Performed at [facility]   Report Status 02/08/2013 FINAL   Final  MRSA PCR SCREENING     Status: None   Collection Time    02/07/13  2:28 AM      Result Value Range Status   MRSA by PCR NEGATIVE  NEGATIVE Final   Comment:            The GeneXpert MRSA Assay (FDA     approved for NASAL specimens     only), is one component of a     comprehensive MRSA colonization     surveillance program. It is not     intended to diagnose MRSA     infection nor to guide or     monitor treatment for     MRSA infections.  RESPIRATORY VIRUS PANEL     Status: None   Collection Time    02/07/13 12:15 PM      Result Value Range Status   Source - RVPAN NASAL SWAB   Corrected   Comment: CORRECTED ON 01/27 AT 2204: PREVIOUSLY REPORTED AS NASAL SWAB   Respiratory Syncytial Virus A NOT DETECTED   Final   Respiratory Syncytial Virus B NOT DETECTED   Final   Influenza A NOT DETECTED   Final   Influenza B NOT DETECTED   Final   Parainfluenza 1 NOT DETECTED   Final   Parainfluenza 2 NOT DETECTED   Final  Parainfluenza 3 NOT DETECTED   Final   Metapneumovirus NOT DETECTED   Final   Rhinovirus NOT DETECTED   Final   Adenovirus NOT DETECTED   Final   Influenza A H1 NOT DETECTED   Final   Influenza A H3 NOT DETECTED   Final   Comment: (NOTE)           Normal Reference Range for each Analyte: NOT DETECTED     Testing performed using the Luminex xTAG Respiratory Viral Panel  test     kit.     This test was developed and its performance characteristics determined     by [facility]. It has not been cleared or approved by the [place]     Food and Drug Administration. This test is used for clinical purposes.     It should not be regarded as investigational or for research. This     laboratory is certified under the [place] (CLIA) as qualified to perform high complexity     clinical laboratory testing.     Performed at [place], BLOOD (ROUTINE X 2)     Status: None   Collection Time    02/08/13 10:53 AM      Result Value Range Status   Specimen Description BLOOD RIGHT ARM   Final   Special Requests BOTTLES DRAWN AEROBIC AND ANAEROBIC 10CC   Final   Culture  Setup Time     Final   Value: 02/08/2013 16:31     Performed at [facility]   Culture     Final   Value:        BLOOD CULTURE RECEIVED NO GROWTH TO DATE CULTURE WILL BE HELD FOR 5 DAYS BEFORE ISSUING A FINAL NEGATIVE REPORT     Performed at [facility]   Report Status PENDING   Incomplete  CULTURE, BLOOD (ROUTINE X 2)     Status: None   Collection Time    02/08/13 10:54 AM      Result Value Range Status   Specimen Description BLOOD LEFT HAND   Final   Special Requests     Final   Value: BOTTLES DRAWN AEROBIC AND ANAEROBIC 10CC BLUE  5CC RED   Culture  Setup Time     Final   Value: 02/08/2013 16:31     Performed at [facility]   Culture     Final   Value:        BLOOD CULTURE RECEIVED NO GROWTH TO DATE CULTURE WILL BE HELD FOR 5 DAYS BEFORE ISSUING A FINAL NEGATIVE REPORT     Performed at [facility]   Report Status PENDING   Incomplete     Studies:  Recent x-ray studies have been reviewed in detail by the Attending Physician  Time spent : 26mns     Allison Ellis, ANP [facility] Office  [phone] [phone]  **If unable to reach the above provider after paging  please contact the [place]@ [phone] On-Call/Text Page:      [email or web address]      password [email or web address]  If 7PM-7AM, please contact night-coverage [email or web address] Password [email or web address] 02/10/2013, 11:10 AM   LOS: 4 days    I have examined the patient, reviewed the chart and modified the above note which I agree with.   Jonel Sick,MD [phone]/29/2015, 5:02 PM

## 2013-02-10 NOTE — Progress Notes (Signed)
SUBJECTIVE: Sharp stabbing chest pain with inspiration. Headache.   BP 103/59  Pulse 92  Temp(Src) 98.5 F (36.9 C) (Oral)  Resp 21  Ht 5\' 4"  (1.626 m)  Wt 166 lb 0.1 oz (75.3 kg)  BMI 28.48 kg/m2  SpO2 94%  LMP 02/05/2013  Intake/Output Summary (Last 24 hours) at 02/10/13 0656 Last data filed at 02/10/13 0200  Gross per 24 hour  Intake   1373 ml  Output   1950 ml  Net   -577 ml    PHYSICAL EXAM General: Well developed, well nourished, in no acute distress. Alert and oriented x 3.  Psych:  Good affect, responds appropriately Neck: No JVD. No masses noted.  Lungs: Clear bilaterally with no wheezes or rhonci noted.  Heart: RRR with no murmurs noted. Abdomen: Bowel sounds are present. Soft, non-tender.  Extremities: No lower extremity edema.   LABS: Basic Metabolic Panel:  Recent Labs  40/98/1101/28/15 0419 02/10/13 0326  NA 135* 139  K 3.7 3.3*  CL 104 105  CO2 18* 19  GLUCOSE 114* 120*  BUN 3* 3*  CREATININE 0.75 0.73  CALCIUM 8.4 8.7   CBC:  Recent Labs  02/09/13 0419 02/10/13 0326  WBC 6.6 8.6  HGB 9.6* 9.7*  HCT 28.2* 29.1*  MCV 79.2 79.1  PLT 128* 162   Cardiac Enzymes:  Recent Labs  02/07/13 1002 02/07/13 1625  TROPONINI <0.30 <0.30   Current Meds: . ampicillin (OMNIPEN) IV  2 g Intravenous Q4H  . gentamicin  60 mg Intravenous Q8H  . levothyroxine  75 mcg Oral QAC breakfast  . pantoprazole  40 mg Oral Daily  . sodium chloride  3 mL Intravenous Q12H     ASSESSMENT AND PLAN: 43 yo female with history of IV drug abuse, TVR and MVR followed by endocarditis and then [facility] with permanent pacemaker in place (epicardial pacemaker leads) who is admitted with sepsis, positive blood cultures for enterococcus. TEE 02/09/13 with immobile leaflet TVR, thickening of tips of aortic valve leaflets concerning for endocarditis. She has been seen in our office by Dr. Eden EmmsNishan in the past.   1. Chest pain: Her pain is  atypical. Worsened with inspiration. CTA chest 02/07/13 without evidence of PE in large vessels. Cardiac markers are negative x 3. She is known to have normal coronary arteries by cath 2012 per records. Chronic pain syndrome. No ischemic evaluation at this time.   2. S/p Mitral valve replacement, Triscuspid valve replacement, aortic valve replacement: [facility]. Denies IV drug use since then. UDS only positive for Thc. Presenting with fevers, abdominal pain, nausea, possibly c/w sepsis. Blood cultures are positive for enterococcus. TEE 02/10/12: the bioprosthetic mitral valve appeared to function normally with trivial MR and no vegetation. The bioprosthetic tricuspid valve did not appear to have a vegetation but one of the leaflets appeared less mobile and there was moderate TR. The mean gradient across the tricuspid valve was elevated at 8 mmHg suggesting possibility of a degree of stenosis. The bioprosthetic aortic valve showed significant thickening along the leaflet tips. This is concerning for endocarditis, especially as the valve is relatively new (from 2014). There was no significant aortic insufficiency. Normal LV size and systolic function, EF 60%. Probably normal RV size and systolic function but not seen well. She has epicardial pacemaker lead. She was seen by Dr. Cornelius Moraswen with CT surgery last night. No current indications for surgery. Will  continue IV antibiotics for now. She would be high risk for repeat operative procedure as she has now had two open sternotomy procedures. 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   3. Sepsis/Enterococcus bacteremia: Antibiotics per primary team/ID.   4. Hypokalemia: Replace K this am.   She could be transferred to telemetry unit. She will need close follow up with her primary cardiologist Dr. Eden Emms after discharge.      MCALHANY,CHRISTOPHER  1/29/20156:56 AM

## 2013-02-10 NOTE — Care Management Note (Signed)
   CARE MANAGEMENT NOTE 02/10/2013  Patient:  [name], [name]   Account Number:  [id number]  Date Initiated:  02/07/2013  Documentation initiated by:  Junius Creamer  Subjective/Objective Assessment:   adm w sepsis     Action/Plan:   lives alone  02/10/13 Pt transferred to 6E, notified of pt need for IV antibiotics and HH services. [facility] has been asked to work with pt re possible [facility] care for IV meds and HH.   Anticipated DC Date:     Anticipated DC Plan:  HOME W HOME HEALTH SERVICES      DC Planning Services  CM consult      Choice offered to / List presented to:             Status of service:   Medicare Important Message given?   (If response is "NO", the following Medicare IM given date fields will be blank) Date Medicare IM given:   Date Additional Medicare IM given:    Discharge Disposition:    Per UR Regulation:  Reviewed for med. necessity/level of care/duration of stay  If discussed at Long Length of Stay Meetings, dates discussed:    Comments:  02/10/13 Notified of need for [facility] and IV antibiotics by D [name] RNCM. This CM has asked [facility] to eval this pt for possible assistance with HH needs and IV antbiotics. Have requested prescriptions ASAP in order to calculate cost and will assist in evaluation social situation. Noted that pt lives alone, hopefully she has some assistance in the home as [facility] generally requires persons in the home to train for all patients receiving [facility] services such as IV antibiotics. Will continue to follow.    Johny Shock RN MPH, case manager, [phone]  1/28 1218p Deborah dowell rn,bsn spoke w pt. she has medicaid pending and should hear something soon. if medicaid approved they will assign her new pcp. did give her the [facility] resourse list if needed.

## 2013-02-10 NOTE — Progress Notes (Addendum)
Report called to 6E nurse, Serita Kyle, RN.  Updated to patient's history and current status.  Patient is stable and will prepare to transfer at this time.

## 2013-02-11 ENCOUNTER — Encounter: Payer: Self-pay | Admitting: Physician Assistant

## 2013-02-11 ENCOUNTER — Inpatient Hospital Stay (HOSPITAL_COMMUNITY): Payer: Medicaid Other

## 2013-02-11 ENCOUNTER — Encounter (HOSPITAL_COMMUNITY): Payer: Self-pay | Admitting: Thoracic Surgery (Cardiothoracic Vascular Surgery)

## 2013-02-11 DIAGNOSIS — I359 Nonrheumatic aortic valve disorder, unspecified: Secondary | ICD-10-CM

## 2013-02-11 DIAGNOSIS — I442 Atrioventricular block, complete: Secondary | ICD-10-CM | POA: Diagnosis present

## 2013-02-11 DIAGNOSIS — I2789 Other specified pulmonary heart diseases: Secondary | ICD-10-CM

## 2013-02-11 DIAGNOSIS — T8201XA Breakdown (mechanical) of heart valve prosthesis, initial encounter: Secondary | ICD-10-CM | POA: Diagnosis present

## 2013-02-11 DIAGNOSIS — I330 Acute and subacute infective endocarditis: Secondary | ICD-10-CM

## 2013-02-11 DIAGNOSIS — I5031 Acute diastolic (congestive) heart failure: Secondary | ICD-10-CM

## 2013-02-11 DIAGNOSIS — Y849 Medical procedure, unspecified as the cause of abnormal reaction of the patient, or of later complication, without mention of misadventure at the time of the procedure: Secondary | ICD-10-CM

## 2013-02-11 DIAGNOSIS — B1920 Unspecified viral hepatitis C without hepatic coma: Secondary | ICD-10-CM | POA: Insufficient documentation

## 2013-02-11 DIAGNOSIS — R6510 Systemic inflammatory response syndrome (SIRS) of non-infectious origin without acute organ dysfunction: Secondary | ICD-10-CM

## 2013-02-11 LAB — BASIC METABOLIC PANEL
BUN: 4 mg/dL — ABNORMAL LOW (ref 6–23)
CALCIUM: 8.7 mg/dL (ref 8.4–10.5)
CO2: 22 mEq/L (ref 19–32)
Chloride: 103 mEq/L (ref 96–112)
Creatinine, Ser: 0.86 mg/dL (ref 0.50–1.10)
GFR calc non Af Amer: 81 mL/min — ABNORMAL LOW (ref >90)
Glucose, Bld: 117 mg/dL — ABNORMAL HIGH (ref 70–99)
POTASSIUM: 3.9 meq/L (ref 3.7–5.3)
SODIUM: 140 meq/L (ref 137–147)

## 2013-02-11 LAB — CBC
HCT: 30.4 % — ABNORMAL LOW (ref 36.0–46.0)
Hemoglobin: 10.1 g/dL — ABNORMAL LOW (ref 12.0–15.0)
MCH: 26.3 pg (ref 26.0–34.0)
MCHC: 33.2 g/dL (ref 30.0–36.0)
MCV: 79.2 fL (ref 78.0–100.0)
Platelets: 181 10*3/uL (ref 150–400)
RBC: 3.84 MIL/uL — ABNORMAL LOW (ref 3.87–5.11)
RDW: 17.8 % — ABNORMAL HIGH (ref 11.5–15.5)
WBC: 10.1 10*3/uL (ref 4.0–10.5)

## 2013-02-11 MED ORDER — SODIUM CHLORIDE 0.9 % IV SOLN: 2.0000 g | INTRAVENOUS | Status: AC

## 2013-02-11 MED ORDER — GENTAMICIN IN SALINE 1.2-0.9 MG/ML-% IV SOLN: 60.0000 mg | Freq: Three times a day (TID) | INTRAVENOUS | Status: DC

## 2013-02-11 MED ORDER — PANTOPRAZOLE SODIUM 40 MG PO TBEC: 40.0000 mg | DELAYED_RELEASE_TABLET | Freq: Every day | ORAL | Status: DC

## 2013-02-11 MED ORDER — SODIUM CHLORIDE 0.9 % IJ SOLN
10.0000 mL | INTRAMUSCULAR | Status: DC | PRN
  Administered 2013-02-11: 10 mL
  Administered 2013-02-13: 20 mL
  Administered 2013-02-14 (×2): 10 mL

## 2013-02-11 MED ORDER — TRAMADOL HCL 50 MG PO TABS: 50.0000 mg | ORAL_TABLET | Freq: Four times a day (QID) | ORAL | Status: DC | PRN

## 2013-02-11 MED ORDER — SODIUM CHLORIDE 0.9 % IV SOLN: 2.0000 g | INTRAVENOUS | Status: DC

## 2013-02-11 NOTE — Progress Notes (Signed)
ANTIBIOTIC CONSULT NOTE - Follow Up  Pharmacy Consult for gentamicin Indication: endocarditis  No Known Allergies  Patient Measurements: Height: 5\' 4"  (162.6 cm) Weight: 169 lb 1.6 oz (76.703 kg) IBW/kg (Calculated) : 54.7 Adjusted Body Weight: 63.5kg  Vital Signs: Temp: 98.8 F (37.1 C) (01/30 0936) Temp src: Oral (01/30 0936) BP: 98/50 mmHg (01/30 0936) Pulse Rate: 88 (01/30 0936) Intake/Output from previous day: 01/29 0701 - 01/30 0700 In: 2073 [P.O.:1140; I.V.:123; IV Piggyback:350] Out: 1000 [Urine:1000] Intake/Output from this shift: Total I/O In: 360 [P.O.:360] Out: -   Labs:  Recent Labs  02/09/13 0419 02/10/13 0326 02/11/13 0448  WBC 6.6 8.6 10.1  HGB 9.6* 9.7* 10.1*  PLT 128* 162 181  CREATININE 0.75 0.73 0.86   Estimated Creatinine Clearance: 83.7 ml/min (by C-G formula based on Cr of 0.86).  Recent Labs  02/08/13 1900  GENTTROUGH 0.5     Microbiology: Recent Results (from the past 720 hour(s))  CULTURE, BLOOD (ROUTINE X 2)     Status: None   Collection Time    02/06/13  8:20 PM      Result Value Range Status   Specimen Description BLOOD RIGHT ARM   Final   Special Requests BOTTLES DRAWN AEROBIC AND ANAEROBIC 10CC EA   Final   Culture  Setup Time     Final   Value: 02/07/2013 01:15     Performed at [facility]   Culture     Final   Value: ENTEROCOCCUS SPECIES     Note: COMBINATION THERAPY OF HIGH DOSE AMPICILLIN OR VANCOMYCIN, PLUS AN AMINOGLYCOSIDE, IS USUALLY INDICATED FOR SERIOUS ENTEROCOCCAL INFECTIONS.     Note: Gram Stain Report Called to,Read Back By and Verified With: STEPHANIE SHAW@1420  ON [id number] BY [facility]     Performed at [facility]   Report Status 02/09/2013 FINAL   Final   Organism ID, Bacteria ENTEROCOCCUS SPECIES   Final  CULTURE, BLOOD (ROUTINE X 2)     Status: None   Collection Time    02/06/13  8:30 PM      Result Value Range Status   Specimen Description BLOOD LEFT HAND   Final   Special Requests  BOTTLES DRAWN AEROBIC ONLY 8CC   Final   Culture  Setup Time     Final   Value: 02/07/2013 01:15     Performed at [facility]   Culture     Final   Value: ENTEROCOCCUS SPECIES     Note: SUSCEPTIBILITIES PERFORMED ON PREVIOUS CULTURE WITHIN THE LAST 5 DAYS.     Note: CRITICAL RESULT CALLED TO, READ BACK BY AND VERIFIED WITH: STEPHANIE SHAW@1440  ON [id number] BY [facility]     Performed at [facility]   Report Status 02/09/2013 FINAL   Final  URINE CULTURE     Status: None   Collection Time    02/06/13 10:16 PM      Result Value Range Status   Specimen Description URINE, CATHETERIZED   Final   Special Requests Normal   Final   Culture  Setup Time     Final   Value: 02/07/2013 04:04     Performed at [place]     Final   Value: NO GROWTH     Performed at [facility]   Culture     Final   Value: NO GROWTH     Performed at [facility]   Report Status 02/08/2013 FINAL   Final  MRSA PCR SCREENING     Status: None   Collection Time    02/07/13  2:28 AM      Result Value Range Status   MRSA by PCR NEGATIVE  NEGATIVE Final   Comment:            The GeneXpert MRSA Assay (FDA     approved for NASAL specimens     only), is one component of a     comprehensive MRSA colonization     surveillance program. It is not     intended to diagnose MRSA     infection nor to guide or     monitor treatment for     MRSA infections.  RESPIRATORY VIRUS PANEL     Status: None   Collection Time    02/07/13 12:15 PM      Result Value Range Status   Source - RVPAN NASAL SWAB   Corrected   Comment: CORRECTED ON 01/27 AT 2204: PREVIOUSLY REPORTED AS NASAL SWAB   Respiratory Syncytial Virus A NOT DETECTED   Final   Respiratory Syncytial Virus B NOT DETECTED   Final   Influenza A NOT DETECTED   Final   Influenza B NOT DETECTED   Final   Parainfluenza 1 NOT DETECTED   Final   Parainfluenza 2 NOT DETECTED   Final   Parainfluenza 3 NOT DETECTED   Final    Metapneumovirus NOT DETECTED   Final   Rhinovirus NOT DETECTED   Final   Adenovirus NOT DETECTED   Final   Influenza A H1 NOT DETECTED   Final   Influenza A H3 NOT DETECTED   Final   Comment: (NOTE)           Normal Reference Range for each Analyte: NOT DETECTED     Testing performed using the Luminex xTAG Respiratory Viral Panel test     kit.     This test was developed and its performance characteristics determined     by [facility]. It has not been cleared or approved by the [place]     Food and Drug Administration. This test is used for clinical purposes.     It should not be regarded as investigational or for research. This     laboratory is certified under the [place] (CLIA) as qualified to perform high complexity     clinical laboratory testing.     Performed at [facility], BLOOD (ROUTINE X 2)     Status: None   Collection Time    02/08/13 10:53 AM      Result Value Range Status   Specimen Description BLOOD RIGHT ARM   Final   Special Requests BOTTLES DRAWN AEROBIC AND ANAEROBIC 10CC   Final   Culture  Setup Time     Final   Value: 02/08/2013 16:31     Performed at [facility]   Culture     Final   Value:        BLOOD CULTURE RECEIVED NO GROWTH TO DATE CULTURE WILL BE HELD FOR 5 DAYS BEFORE ISSUING A FINAL NEGATIVE REPORT     Performed at [facility]   Report Status PENDING   Incomplete  CULTURE, BLOOD (ROUTINE X 2)     Status: None   Collection Time    02/08/13 10:54 AM      Result Value Range Status   Specimen Description BLOOD LEFT  HAND   Final   Special Requests     Final   Value: BOTTLES DRAWN AEROBIC AND ANAEROBIC 10CC BLUE  5CC RED   Culture  Setup Time     Final   Value: 02/08/2013 16:31     Performed at [facility]   Culture     Final   Value:        BLOOD CULTURE RECEIVED NO GROWTH TO DATE CULTURE WILL BE HELD FOR 5 DAYS BEFORE ISSUING A FINAL NEGATIVE REPORT      Performed at [facility]   Report Status PENDING   Incomplete    Medical History: Past Medical History  Diagnosis Date  . Bacterial endocarditis     a. 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 b. re-do mitral valve replacement, tricuspid valve replacement, and aortic valve replacement (all bioprosthetic).  [name] DJD (degenerative joint disease)   . Hepatitis C     history of  . Anemia   . Hypothyroidism   . Stroke   . Anxiety   . Mental disorder   . Chronic pain   . Noncompliance   . Polysubstance abuse   . Sepsis     a. 05/2010 with sepsis, vegegations on valve replacements, ARDS, VDRF, multiorgan failure with elevated cardiac enzymes, suffered a left fronto-parietal subarachnoid hemorrhage, pulmonary infarcts, ARF requiring brief hemodialysis. s/p perciardial MVR/TVR.  [name] Atrial flutter     a. Post-op from valve replacement 06/2010.  [name] CHB (complete heart block)     a. After valve replacement 09/2012 - pacer placement.  . 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 Admitted with hypotension, weakness and chest pain. 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  Goal of Therapy:  Gentamicin peak level 3-4mg /ml Gentamicin trough level <1 mcg/ml  Plan:  1. Continue Gentamicin 60mg  IV q8h (1mg /kg synergy dosing) 2. Follow renal function, clinical progression 3.  Recheck gent trough 2/3  Excell Seltzer, PharmD Clinical Pharmacist Pager:  [phone] Thank you for allowing pharmacy to be part of this patients care team. 02/11/2013 9:40 AM

## 2013-02-11 NOTE — Care Management Note (Signed)
   CARE MANAGEMENT NOTE 02/11/2013  Patient:  Deborah [name], Deborah [name]   Account Number:  [id number]  Date Initiated:  02/07/2013  Documentation initiated by:  Deborah [name]  Subjective/Objective Assessment:   adm w sepsis     Action/Plan:   lives alone  02/10/13 Pt transferred to 6E, notified of pt need for IV antibiotics and HH services. [facility] has been asked to work with pt re possible [facility] care for IV meds and HH.   Anticipated DC Date:     Anticipated DC Plan:  HOME W HOME HEALTH SERVICES      DC Planning Services  CM consult      Choice offered to / List presented to:             Status of service:   Medicare Important Message given?   (If response is "NO", the following Medicare IM given date fields will be blank) Date Medicare IM given:   Date Additional Medicare IM given:    Discharge Disposition:    Per UR Regulation:  Reviewed for med. necessity/level of care/duration of stay  If discussed at Long Length of Stay Meetings, dates discussed:    Comments:  02/11/2013 This CM notified by [facility] rep, Deborah Modena RN that this pt qualifies for assistance with Home IV antibiotics and HHRN to assist with adm of IV meds. Pt will be given [name] pump due to frequent administration of antibiotics. Dr Deborah [name] notified and will provide prescriptions this afternoon for the antibiotics. The pt has assured the [facility] team that she will Deborah/c to the home of [name] friend who can assist with IV antibiotics. Will provide [facility] with prescriptions as soon as available. Deborah Shock RN MPH Addem: Prescriptions given to [facility] rep and MATCH letter prepared for pt to use for Protonix. Explained MATCH letter to pt and nurse, letter and prescriptions placed in shadow chart. Will notify weekend CM of plan. Deborah Shock RN MPH, case manager, [phone]  02/10/13 Notified of need for [facility] and IV antibiotics by Deborah [name] RNCM. This CM has asked [facility] to eval this pt for possible assistance with HH needs and IV antbiotics. Have requested  prescriptions ASAP in order to calculate cost and will assist in evaluation social situation. Noted that pt lives alone, hopefully she has some assistance in the home as [facility] generally requires persons in the home to train for all patients receiving [facility] services such as IV antibiotics. Will continue to follow.    Deborah Shock RN MPH, case manager, [phone]  1/28 1218p Deborah dowell rn,bsn spoke w pt. she has medicaid pending and should hear something soon. if medicaid approved they will assign her new pcp. did give her the [facility] [facility] resourse list if needed.

## 2013-02-11 NOTE — Progress Notes (Signed)
Peripherally Inserted Central Catheter/Midline Placement  The IV Nurse has discussed with the patient and/or persons authorized to consent for the patient, the purpose of this procedure and the potential benefits and risks involved with this procedure.  The benefits include less needle sticks, lab draws from the catheter and patient may be discharged home with the catheter.  Risks include, but not limited to, infection, bleeding, blood clot (thrombus formation), and puncture of an artery; nerve damage and irregular heat beat.  Alternatives to this procedure were also discussed.  PICC/Midline Placement Documentation        [name] 02/11/2013, 2:50 PM

## 2013-02-11 NOTE — Progress Notes (Addendum)
[facility] PROGRESS NOTE  [name] JIR:[id number] DOB: 1970/06/22 DOA: 02/06/2013 PCP: Provider Not In System   Brief narrative:  43 year old female patient with history of recurrent gram + cocci endocarditis. She previously had undergone bioprosthetic mitral tricuspid valve replacements but had recurrent endocarditis in Sept 2014 and subsequently underwent redo mitral and tricuspid valve replacements and also aortic valve replacement all with bioprosthetic devices. During that hospitalization (at [facility]) she developed complete heart block and a pacemaker was placed.  She presented to the emergency department because of weakness and chest pain as well as dizziness. States had been ongoing for 4 days and felt like shocks. In the emergency department the patient's pacemaker was interrogated and was functioning appropriately.  After arrival to the ER she was also found to be hypotensive with systolic blood pressures in the 80s. 3 days prior patient has had blood work checked in the ER and now her hemoglobin had decreased by 3 g. Stool for occult blood was positive without any frank rectal bleeding or melena seen on rectal exam per ER physician. She subsequently received 1 L of normal saline. Because of the hypotension a sepsis workup was initiated so urine and blood cultures were obtained and influenza PCR was sent off. She was empirically started on vancomycin and Zosyn. Cardiologist was also consulted.   Assessment/Plan:  1. SIRS / Enterococcus Bacteremia - TEE 1/28 showed thickening leaflets Ao valve concerning for endocarditis   - 2/2 blood cx's positive (1/25) - repeat blood cx's obtained 1/27- neg per ID if FU cx's remain negative as of 1/30 then can place PICC  - pharmacy dosing anbx's; adjusted anbx's to gent and vanco  - ID to follow  2. History of bacterial endocarditis / s/p recurrent bioprosthetic valve replacements  -1st episode was MSSA, 2nd episode was strep viridans  -blood  cx's here positive for enterococcus(see above)- pt denies IVDA  -per ID:  * continue ampicillin 2gm IV Q 4hr plus gentamicin, since gent trough today < 1.  * can place picc line tomorrow if cx from 1/27 remain negative  * will need 6 wks of IV antibiotics as well as follow up.  * We will seen her back in ID clinic in 2 and 6 wks but also have her follow up with her cardiologists at end of course of therapy  3. Dehydration / Hypotension  -resolved, Continue IV fluids gentle  4. Chest pain, non-cardiac; Appreciate Cardiology assistance  -Normal coronaries per cath 2012 therefore no additional ischemic evaluation indicated  -When necessary Mylanta and Ultram and Percocet (see below)  -Cont PPI (see below)  -exam c/w musc-skel pain but also epigastric pain concerning for GI etiology  -add Voltaren gel  4. ?? Chronic pain vs post op pain  -pt admits was dc'd on Methadone after last admit at [facility] for valve repair  -had been using sparingly due to inability to afford refill - ran out 2 weeks ago  5. Pulmonary HTN - 62 mmHg  -Noted on echo prior to recent valve replacement  6. Anemia/Heme positive stool  -Hemoglobin had decreased from 13 to 9 with previous readings in the fall of 2014 much higher in between 14 and 15  -currently stable at 9.7  -consulted GI - rec OP evaluation  -Anemia panel c/w low iron so consider IV dosing once bacteremia better controlled - no evidence of large volume gross blood loss  7. Hypokalemia  -Oral replete  8. Hypothyroidism  -TSH 25.123 - suspect noncompliance  w/ medications  -Free T4 normal  Substance abuse  -UDS pos for THC only 9. 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 Will repeat MRI   Code Status: full Family Communication: d/w patient (indicate person spoken with, relationship, and if by phone, the number) Disposition Plan: likely snf vs  ltec   Consultants:  Cardiology, CT surgery, ID  Procedures:  TEE  Antibiotics: Antibiotics:  Zosyn 1/25 >> 1/26  Vancomycin 1/25 > 1/28  Gentamicin 1/26 >>>  Ampicillin 1/28 >>  Rifampin 1/26 >>1/27   HPI/Subjective: alert  Objective: Filed Vitals:   02/11/13 0936  BP: 98/50  Pulse: 88  Temp: 98.8 F (37.1 C)  Resp: 18    Intake/Output Summary (Last 24 hours) at 02/11/13 0942 Last data filed at 02/11/13 0936  Gross per 24 hour  Intake   2373 ml  Output      0 ml  Net   2373 ml   Filed Weights   02/09/13 0535 02/10/13 0425 02/10/13 2105  Weight: 75.3 kg (166 lb 0.1 oz) 75.3 kg (166 lb 0.1 oz) 76.703 kg (169 lb 1.6 oz)    Exam:   General:  alert  Cardiovascular: s1,s2 rrr  Respiratory: CTA BL  Abdomen: soft, nt, nd   Musculoskeletal: no le edema   Data Reviewed: Basic Metabolic Panel:  Recent Labs Lab 02/07/13 0350 02/08/13 0357 02/09/13 0419 02/10/13 0326 02/11/13 0448  NA 138 135* 135* 139 140  K 3.3* 3.4* 3.7 3.3* 3.9  CL 105 106 104 105 103  CO2 16* 17* 18* 19 22  GLUCOSE 85 147* 114* 120* 117*  BUN 9 6 3* 3* 4*  CREATININE 0.65 0.68 0.75 0.73 0.86  CALCIUM 7.8* 7.4* 8.4 8.7 8.7   Liver Function Tests:  Recent Labs Lab 02/06/13 1950 02/07/13 0350  AST 12 10  ALT 7 6  ALKPHOS 93 87  BILITOT 0.8 0.9  PROT 8.1 7.4  ALBUMIN 3.4* 2.9*    Recent Labs Lab 02/07/13 0350  LIPASE 16   No results found for this basename: AMMONIA,  in the last 168 hours CBC:  Recent Labs Lab 02/06/13 1950 02/07/13 0350 02/08/13 0357 02/09/13 0419 02/10/13 0326 02/11/13 0448  WBC 9.2 7.0 4.5 6.6 8.6 10.1  NEUTROABS 7.5 5.3  --   --   --   --   HGB 11.3* 10.2* 9.0* 9.6* 9.7* 10.1*  HCT 33.2* 30.7* 27.0* 28.2* 29.1* 30.4*  MCV 78.5 79.1 79.4 79.2 79.1 79.2  PLT 151 128* 94* 128* 162 181   Cardiac Enzymes:  Recent Labs Lab 02/06/13 1951 02/07/13 0350 02/07/13 1002 02/07/13 1625  TROPONINI <0.30 <0.30 <0.30 <0.30   BNP (last  3 results)  Recent Labs  09/25/12 0936 02/06/13 1951  PROBNP 6304.0* 1450.0*   CBG: No results found for this basename: GLUCAP,  in the last 168 hours  Recent Results (from the past 240 hour(s))  CULTURE, BLOOD (ROUTINE X 2)     Status: None   Collection Time    02/06/13  8:20 PM      Result Value Range Status   Specimen Description BLOOD RIGHT ARM   Final   Special Requests BOTTLES DRAWN AEROBIC AND ANAEROBIC 10CC EA   Final   Culture  Setup Time     Final   Value: 02/07/2013 01:15     Performed at [facility]   Culture     Final   Value:  ENTEROCOCCUS SPECIES     Note: COMBINATION THERAPY OF HIGH DOSE AMPICILLIN OR VANCOMYCIN, PLUS AN AMINOGLYCOSIDE, IS USUALLY INDICATED FOR SERIOUS ENTEROCOCCAL INFECTIONS.     Note: Gram Stain Report Called to,Read Back By and Verified With: STEPHANIE SHAW@1420  ON [id number] BY [facility]     Performed at [facility]   Report Status 02/09/2013 FINAL   Final   Organism ID, Bacteria ENTEROCOCCUS SPECIES   Final  CULTURE, BLOOD (ROUTINE X 2)     Status: None   Collection Time    02/06/13  8:30 PM      Result Value Range Status   Specimen Description BLOOD LEFT HAND   Final   Special Requests BOTTLES DRAWN AEROBIC ONLY 8CC   Final   Culture  Setup Time     Final   Value: 02/07/2013 01:15     Performed at [facility]   Culture     Final   Value: ENTEROCOCCUS SPECIES     Note: SUSCEPTIBILITIES PERFORMED ON PREVIOUS CULTURE WITHIN THE LAST 5 DAYS.     Note: CRITICAL RESULT CALLED TO, READ BACK BY AND VERIFIED WITH: STEPHANIE SHAW@1440  ON [id number] BY [facility]     Performed at [facility]   Report Status 02/09/2013 FINAL   Final  URINE CULTURE     Status: None   Collection Time    02/06/13 10:16 PM      Result Value Range Status   Specimen Description URINE, CATHETERIZED   Final   Special Requests Normal   Final   Culture  Setup Time     Final   Value: 02/07/2013 04:04     Performed at [place]     Final   Value: NO GROWTH     Performed at [facility]   Culture     Final   Value: NO GROWTH     Performed at [facility]   Report Status 02/08/2013 FINAL   Final  MRSA PCR SCREENING     Status: None   Collection Time    02/07/13  2:28 AM      Result Value Range Status   MRSA by PCR NEGATIVE  NEGATIVE Final   Comment:            The GeneXpert MRSA Assay (FDA     approved for NASAL specimens     only), is one component of a     comprehensive MRSA colonization     surveillance program. It is not     intended to diagnose MRSA     infection nor to guide or     monitor treatment for     MRSA infections.  RESPIRATORY VIRUS PANEL     Status: None   Collection Time    02/07/13 12:15 PM      Result Value Range Status   Source - RVPAN NASAL SWAB   Corrected   Comment: CORRECTED ON 01/27 AT 2204: PREVIOUSLY REPORTED AS NASAL SWAB   Respiratory Syncytial Virus A NOT DETECTED   Final   Respiratory Syncytial Virus B NOT DETECTED   Final   Influenza A NOT DETECTED   Final   Influenza B NOT DETECTED   Final   Parainfluenza 1 NOT DETECTED   Final   Parainfluenza 2 NOT DETECTED   Final   Parainfluenza 3 NOT DETECTED   Final   Metapneumovirus NOT DETECTED   Final   Rhinovirus NOT DETECTED  Final   Adenovirus NOT DETECTED   Final   Influenza A H1 NOT DETECTED   Final   Influenza A H3 NOT DETECTED   Final   Comment: (NOTE)           Normal Reference Range for each Analyte: NOT DETECTED     Testing performed using the Luminex xTAG Respiratory Viral Panel test     kit.     This test was developed and its performance characteristics determined     by [facility]. It has not been cleared or approved by the [place]     Food and Drug Administration. This test is used for clinical purposes.     It should not be regarded as investigational or for research. This     laboratory is certified under the [place] (CLIA) as  qualified to perform high complexity     clinical laboratory testing.     Performed at [place], BLOOD (ROUTINE X 2)     Status: None   Collection Time    02/08/13 10:53 AM      Result Value Range Status   Specimen Description BLOOD RIGHT ARM   Final   Special Requests BOTTLES DRAWN AEROBIC AND ANAEROBIC 10CC   Final   Culture  Setup Time     Final   Value: 02/08/2013 16:31     Performed at [facility]   Culture     Final   Value:        BLOOD CULTURE RECEIVED NO GROWTH TO DATE CULTURE WILL BE HELD FOR 5 DAYS BEFORE ISSUING A FINAL NEGATIVE REPORT     Performed at [facility]   Report Status PENDING   Incomplete  CULTURE, BLOOD (ROUTINE X 2)     Status: None   Collection Time    02/08/13 10:54 AM      Result Value Range Status   Specimen Description BLOOD LEFT HAND   Final   Special Requests     Final   Value: BOTTLES DRAWN AEROBIC AND ANAEROBIC 10CC BLUE  5CC RED   Culture  Setup Time     Final   Value: 02/08/2013 16:31     Performed at [facility]   Culture     Final   Value:        BLOOD CULTURE RECEIVED NO GROWTH TO DATE CULTURE WILL BE HELD FOR 5 DAYS BEFORE ISSUING A FINAL NEGATIVE REPORT     Performed at [facility]   Report Status PENDING   Incomplete     Studies: No results found.  Scheduled Meds: . ampicillin (OMNIPEN) IV  2 g Intravenous Q4H  . diclofenac sodium  2 g Topical Q6H  . gentamicin  60 mg Intravenous Q8H  . levothyroxine  75 mcg Oral QAC breakfast  . pantoprazole  40 mg Oral BID  . 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 If 7PM-7AM, please contact night-coverage at [email or web address], password [facility] 02/11/2013, 9:42 AM  LOS: 5 days

## 2013-02-11 NOTE — Consult Note (Addendum)
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 She was treated at [facility] where she underwent mitral and tricuspid valve replacement using a bioprosthetic tissue valves by Dr. Nathaniel Man in June of 2012. She eventually recovered but unfortunately went back to intermittent intravenous heroin abuse.  She was readmitted to [facility] last September with strep viridans prosthetic valve endocarditis associated with large vegetations on both the mitral and tricuspid valves with a large abscess extending into the aortic root. She underwent redo mitral valve replacement, redo tricuspid valve replacement, and aortic valve replacement with patch reconstruction of the superior vena cava and aortic root by Dr. Nathaniel Man on 10/04/2012.  She did remarkably well under the circumstances although she did develop complete heart block for which she underwent permanent pacemaker placement using epicardial leads on 10/08/2012. Her subsequent convalescence was slow but she was eventually discharged from the hospital 11/12/2012 after completing a 6 week course of intravenous ceftriaxone.  The patient relocated [facility] after she was discharged from the hospital where she has been living in a trailer with a friend. She has 3 children and  several grandchildren, most of whom live in [place].  She states that since hospital discharge she has had stable mild exertional shortness of breath but chronic intermittent night sweats and chills.  She began to experience atypical chest pains as well as paresthesias in her chest which she describes as "battery shock" associated with her pacemaker. She developed persistent headaches and low-grade fever. Blood cultures obtained 02/06/2013 grew Enterococcus species sensitive to both ampicillin and vancomycin. The patient was admitted to the hospital and transesophageal echocardiogram performed with findings concerning for the possibility of recurrent prosthetic valve endocarditis.  The patient reports stable mild exertional shortness of breath which has remained essentially unchanged since she was discharged from [facility] last October. She denies resting shortness of breath, PND, orthopnea, or lower extremity edema. She has atypical chest pains that are on related to physical activity which she relates to her pacemaker.  She has persistent headaches which she states are typically located behind her left eye.  She reports occasional night sweats and diaphoresis with low-grade fevers. She has some anorexia although she has been eating better the last 2 days.  She claims that she has not gone back using intravenous drugs since last September, although she was positive for [facility] at the time of this hospital admission.    Past Medical History  Diagnosis Date  . Bacterial endocarditis     a. s/p MVR and TVR at [facility] 06/2010 secondary to endocarditis related to IVDA; normal cors by cath 06/2010 at [facility]. b. re-do mitral valve replacement, tricuspid valve replacement, and aortic valve replacement (all bioprosthetic).  [name] DJD (degenerative joint disease)   .  Hepatitis C   . Anemia   . Hypothyroidism   . 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 Anxiety   . Mental disorder   . Chronic pain    . Noncompliance   . Polysubstance abuse   . Sepsis     a. 05/2010 with sepsis, vegegations on valve replacements, ARDS, VDRF, multiorgan failure with elevated cardiac enzymes, suffered a left fronto-parietal subarachnoid hemorrhage, pulmonary infarcts, ARF requiring brief hemodialysis. s/p perciardial MVR/TVR.  [name] Atrial flutter     a. Post-op from valve replacement 06/2010.  [name] CHB (complete heart block)     a. After valve replacement 09/2012 - pacer placement.  . Opiate abuse, episodic   . S/P AVR, redo MVR and redo TVR with bioprosthetic valves 09/26/2012    All bioprosthetic tissue valves placed for Strep viridans prosthetic valve endocarditis by Dr. Nathaniel Man @ [facility]  . S/P placement of cardiac pacemaker 10/08/2012    Transvenous permanent pacemaker for CHB following redo TVR, redo MVR, AVR @ [facility]  . 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 Cholecystectomy    . Tonsillectomy and adenoidectomy    . Valve replacement  09/26/2012    AVR + redo MVR + redo TVR for PVE @ [facility]  . Valve replacement  06/2010    MVR + TVR for bacterial endocarditis @ [facility]  . Pacemaker placement  10/08/2012    [facility] for CHB after redo TVR  . Cataract extraction    . Multiple extractions with alveoloplasty  09/01/2011    Procedure: MULTIPLE EXTRACION WITH ALVEOLOPLASTY;  Surgeon: Georgia Lopes, DDS;  Location: [facility] OR;  Service: Oral Surgery;  Laterality: N/A;  . Tee without cardioversion N/A 02/09/2013    Procedure: TRANSESOPHAGEAL ECHOCARDIOGRAM (TEE);  Surgeon: Laurey Morale, MD;  Location: [facility] ENDOSCOPY;  Service: Cardiovascular;  Laterality: N/A;  pt awaker, responsive,..tol procedure well...MAP 76    Family History  Problem Relation Age of Onset  . Cancer    . Coronary  artery disease      History   Social History  . Marital Status: Legally Separated    Spouse Name: N/A    Number of Children: N/A  . Years of Education: N/A   Occupational History  . Not on file.   Social History Main Topics  . Smoking status: Current Every Day Smoker -- 1.00 packs/day for 30 years    Types: Cigarettes  . Smokeless tobacco: Not on file     Comment: 1 pack every 3 days - smoking vapor cigs   . Alcohol Use: No  . Drug Use: Yes    Special: Marijuana, Cocaine     Comment: prior IVDA, cocaine. + occasional narcotics  . Sexual Activity: Not on file   Other Topics Concern  . Not on file   Social History Narrative  . No narrative on file    Prior to Admission medications   Medication Sig Start Date End Date Taking? Authorizing Provider  acetaminophen (TYLENOL) 500 MG tablet Take 1,000 mg by mouth every 6 (six) hours as needed for moderate pain.    Yes Historical Provider, MD  aspirin 81 MG chewable tablet Chew 81 mg by mouth daily.   Yes Historical Provider, MD  levothyroxine (SYNTHROID, LEVOTHROID) 75 MCG tablet Take 75 mcg by mouth daily before breakfast.   Yes Historical Provider, MD    Current Facility-Administered Medications  Medication Dose Route Frequency Provider Last Rate Last Dose  . acetaminophen (TYLENOL) tablet 650 mg  650 mg Oral Q6H PRN Eduard ClosArshad N Kakrakandy, MD   650 mg at 02/10/13 2141   Or  . acetaminophen (TYLENOL) suppository 650 mg  650 mg Rectal Q6H PRN Eduard ClosArshad N Kakrakandy, MD      . alum & mag hydroxide-simeth (MAALOX/MYLANTA) 200-200-20 MG/5ML suspension 30 mL  30 mL Oral Q4H PRN Russella DarAllison L Ellis, NP      . ampicillin (OMNIPEN) 2 g in sodium chloride 0.9 % 50 mL IVPB  2 g Intravenous Q4H Judyann Munsonynthia Snider, MD   2 g at 02/11/13 1309  . diclofenac sodium (VOLTAREN) 1 % transdermal gel 2 g  2 g Topical Q6H Russella DarAllison L Ellis, NP   2 g at 02/10/13 1553  . gentamicin (GARAMYCIN) IVPB 60 mg  60 mg Intravenous Q8H Lauren Bajbus, RPH   60 mg at 02/11/13  1012  . levothyroxine (SYNTHROID, LEVOTHROID) tablet 75 mcg  75 mcg Oral QAC breakfast Eduard ClosArshad N Kakrakandy, MD   75 mcg at 02/11/13 0744  . LORazepam (ATIVAN) tablet 0.5 mg  0.5 mg Oral Q6H PRN Russella DarAllison L Ellis, NP      . ondansetron [facility](ZOFRAN) tablet 4 mg  4 mg Oral Q6H PRN Eduard ClosArshad N Kakrakandy, MD   4 mg at 02/11/13 0843   Or  . ondansetron (ZOFRAN) injection 4 mg  4 mg Intravenous Q6H PRN Eduard ClosArshad N Kakrakandy, MD   4 mg at 02/07/13 1549  . oxyCODONE (Oxy IR/ROXICODONE) immediate release tablet 10 mg  10 mg Oral Q4H PRN Russella DarAllison L Ellis, NP   10 mg at 02/11/13 1309  . pantoprazole (PROTONIX) EC tablet 40 mg  40 mg Oral BID Calvert CantorSaima Rizwan, MD   40 mg at 02/11/13 1012  . sodium chloride 0.9 % injection 3 mL  3 mL Intravenous Q12H Eduard ClosArshad N Kakrakandy, MD   3 mL at 02/10/13 2144  . traMADol (ULTRAM) tablet 50 mg  50 mg Oral Q6H PRN Russella DarAllison L Ellis, NP   50 mg at 02/11/13 1012    No Known Allergies    Review of Systems:   General:  decreased appetite, decreased energy, no weight gain, no weight loss, + low grade fever  Cardiac:  no chest pain with exertion, + atypical chest pain at rest, + SOB with exertion, no resting SOB, no PND, no orthopnea, no palpitations, no arrhythmia, no atrial fibrillation, no LE edema, no dizzy spells, no syncope  Respiratory:  + exertional shortness of breath, no home oxygen, no productive cough, no dry cough, no bronchitis, no wheezing, no hemoptysis, no asthma, + pain with inspiration or cough, no sleep apnea, no CPAP at night  GI:   no difficulty swallowing, no reflux, no frequent heartburn, no hiatal hernia, no abdominal pain, + constipation, no diarrhea, no hematochezia, no hematemesis, no melena  GU:   no dysuria,  no frequency, no urinary tract infection, no hematuria, no kidney stones, no kidney disease  Vascular:  no pain suggestive of claudication, no pain in feet, no leg cramps, no varicose veins, no DVT, no non-healing foot ulcer  Neuro:   + stroke, no TIA's, no  seizures, + headaches, no temporary blindness one eye,  no slurred speech, no peripheral neuropathy, + chronic pain, no instability of gait, no memory/cognitive  dysfunction  Musculoskeletal: + arthritis, no joint swelling, no myalgias, no difficulty walking, normal mobility   Skin:   no rash, no itching, no skin infections, no pressure sores or ulcerations  Psych:   no anxiety, no depression, no nervousness, no unusual recent stress  Eyes:   + blurry vision, no floaters, no recent vision changes, does not wear glasses or contacts  ENT:   no hearing loss, no loose or painful teeth, upper plate dentures, last saw dentist while at [facility]  Hematologic:  no easy bruising, no abnormal bleeding, no clotting disorder, no frequent epistaxis  Endocrine:  no diabetes, does not check CBG's at home     Physical Exam:   BP 107/70  Pulse 93  Temp(Src) 98.7 F (37.1 C) (Oral)  Resp 21  Ht 5\' 4"  (1.626 m)  Wt 76.703 kg (169 lb 1.6 oz)  BMI 29.01 kg/m2  SpO2 99%  LMP 02/05/2013  General:  Chronically ill-appearing  HEENT:  Unremarkable   Neck:   no JVD, no bruits, no adenopathy   Chest:   clear to auscultation, symmetrical breath sounds, no wheezes, no rhonchi   CV:   RRR, no murmur   Abdomen:  soft, non-tender, no masses   Extremities:  warm, well-perfused, pulses palpable, no peripheral signs of embolization  Rectal/GU  Deferred  Neuro:   Grossly non-focal and symmetrical throughout  Skin:   Clean and dry, no rashes, no breakdown  Diagnostic Tests:  BLOOD LEFT HAND Special Requests BOTTLES DRAWN AEROBIC ONLY 8CC Culture Setup Time 02/07/2013 01:15 Performed at [facility] Culture ENTEROCOCCUS SPECIES Note: SUSCEPTIBILITIES PERFORMED ON PREVIOUS CULTURE WITHIN THE LAST 5 DAYS. Note: CRITICAL RESULT CALLED TO, READ BACK BY AND VERIFIED WITH: STEPHANIE SHAW@[id number]  ON [id number] BY [facility] Performed at [facility] Report Status 02/09/2013 FINAL  Transthoracic Echocardiography  Patient:  [name], [name] MR #: [id number] Study Date: 02/07/2013 Gender: F Age: 31 Height: 162.6cm Weight: 76.7kg BSA: 1.24m^2 Pt. Status: Room: 2C08C  ATTENDING Lonia Blood ADMITTING Midge Minium SONOGRAPHER Georgian Co, RDCS, CCT ORDERING McAlhany, Christopher PERFORMING [facility], Inpatient cc:  ------------------------------------------------------------  ------------------------------------------------------------ History: PMH: Status post Mitral valve replacement, Tricuspid valve replacement and Aortic valve replacement for endocarditis. Atrial flutter. Angina pectoris. Primary pulmonary hypertension. Risk factors: Systemic inflammatory response syndrome. Current tobacco use.  ------------------------------------------------------------ Study Conclusions  - Left ventricle: Poor acoustic windows. Overall LVEF is normal with septal hypokinesis. The cavity size was normal. Wall thickness was normal. - Aortic valve: AV is not well seen. Peak and mean gradients are 26 and 14 mm Hg respectively consistent with mild stenosis. - Mitral valve: MV prosthesis was difficult to see well Peak and mean gradients are 14 and 6 mm Hg respectively. Transthoracic echocardiography. M-mode, complete 2D, spectral Doppler, and color Doppler. Height: Height: 162.6cm. Height: 64in. Weight: Weight: 76.7kg. Weight: 168.6lb. Body mass index: BMI: 29kg/m^2. Body surface area: BSA: 1.43m^2. Blood pressure: 103/66. Patient status: Inpatient. Location: ICU/CCU  ------------------------------------------------------------  ------------------------------------------------------------ Left ventricle: Poor acoustic windows. Overall LVEF is normal with septal hypokinesis. The cavity size was normal. Wall thickness was normal.  ------------------------------------------------------------ Aortic valve: AV is not well seen. Peak and mean gradients are 26 and 14 mm Hg respectively consistent with  mild stenosis. Doppler: No significant regurgitation. VTI ratio of LVOT to aortic valve: 0.57. Valve area: 1.29cm^2(VTI). Indexed valve area: 0.71cm^2/m^2 (VTI). Peak velocity ratio of LVOT to aortic valve: 0.52. Valve area: 1.18cm^2 (Vmax). Indexed valve area: 0.65cm^2/m^2 (Vmax). Mean gradient: 14mm Hg (S). Peak gradient:  42mm Hg (S).  ------------------------------------------------------------ Mitral valve: MV prosthesis was difficult to see well Peak and mean gradients are 14 and 6 mm Hg respectively. Doppler: Trivial regurgitation. Valve area by pressure half-time: 3.73cm^2. Indexed valve area by pressure half-time: 2.05cm^2/m^2. Valve area by continuity equation (using LVOT flow): 1.57cm^2. Indexed valve area by continuity equation (using LVOT flow): 0.86cm^2/m^2. Mean gradient: 73mm Hg (D). Peak gradient: 33mm Hg (D).  ------------------------------------------------------------ Left atrium: The atrium was normal in size.  ------------------------------------------------------------ Right ventricle: RV free wall is not well visualized. Cannot fully evaluate function.  ------------------------------------------------------------ Tricuspid valve: Poorly visualized.  ------------------------------------------------------------ Right atrium: Poorly visualized.  ------------------------------------------------------------  2D measurements Normal Doppler measurements Normal Left ventricle Main pulmonary LVID ED, 40.5 mm 43-52 artery chord, Pressure, 26 mm Hg =30 PLAX S LVID ES, 33.3 mm 23-38 LVOT chord, Peak vel, 131 cm/s ------ PLAX S FS, chord, 18 % >29 VTI, S 23.6 cm ------ PLAX Peak 7 mm Hg ------ LVPW, ED 10.3 mm ------ gradient, IVS/LVPW 0.9 <1.3 S ratio, ED Aortic valve Ventricular septum Peak vel, 252 cm/s ------ IVS, ED 9.22 mm ------ S LVOT Mean vel, 171 cm/s ------ Diam, S 17 mm ------ S Area 2.27 cm^2 ------ VTI, S 41.5 cm ------ Aorta Mean 14 mm Hg  ------ Root diam, 33 mm ------ gradient, ED S Left atrium Peak 25 mm Hg ------ AP dim 31 mm ------ gradient, AP dim 1.7 cm/m^2 <2.2 S index VTI ratio 0.57 ------ Vol, S 37.6 ml ------ LVOT/AV Vol index, 20.7 ml/m^2 ------ Area, VTI 1.29 cm^2 ------ S Area index 0.71 cm^2/m ------ (VTI) ^2 Peak vel 0.52 ------ ratio, LVOT/AV Area, Vmax 1.18 cm^2 ------ Area index 0.65 cm^2/m ------ (Vmax) ^2 Mitral valve Peak E vel 171 cm/s ------ Peak A vel 95.5 cm/s ------ Mean vel, 112 cm/s ------ D Decelerati 158 ms 150-23 on time 0 Pressure 59 ms ------ half-time Mean 6 mm Hg ------ gradient, D Peak 13 mm Hg ------ gradient, D Peak E/A 1.8 ------ ratio Area (PHT) 3.73 cm^2 ------ Area index 2.05 cm^2/m ------ (PHT) ^2 Area 1.57 cm^2 ------ (LVOT) continuity Area index 0.86 cm^2/m ------ (LVOT ^2 cont) Annulus 34.1 cm ------ VTI Tricuspid valve Regurg 242 cm/s ------ peak vel Peak RV-RA 23 mm Hg ------ gradient, S Max regurg 242 cm/s ------ vel Systemic veins Estimated 3 mm Hg ------ CVP Right ventricle Pressure, 26 mm Hg <30 S  ------------------------------------------------------------ Prepared and Electronically Authenticated by  Dietrich Pates [id number]:43:[id number]    Transesophageal Echocardiography  Indication: Enterococcus bacteremia  Sedation: Versed 4 mg IV, Fentanyl 25 mcg IV  Findings: Please see echo study for full report. The patient has bioprosthetic aortic, mitral, and tricuspid valves. The bioprosthetic mitral valve appeared to function normally with trivial MR and no vegetation. The bioprosthetic tricuspid valve did not appear to have a vegetation but one of the leaflets appeared less mobile and there was moderate TR. The mean gradient across the tricuspid valve was elevated at 8 mmHg suggesting possibility of a degree of stenosis. The bioprosthetic aortic valve showed significant thickening along the leaflet tips. I am concerned that this  represents endocarditis, especially as the valve is relatively new (from 2014). There was no significant aortic insufficiency. Normal LV size and systolic function, EF 60%. Probably normal RV size and systolic function but not seen well. There were no pacemaker leads noted in the heart, suspect pacemaker is external.  Will discuss TEE with team.  Marca Ancona  02/09/2013  9:02 AM  Impression:  I have personally reviewed the patient's transesophageal echocardiogram and I agree that the appearance of the bioprosthetic tissue valve in the aortic position his most concerning for the presence of likely prosthetic valve endocarditis.  This early after aortic valve replacement there are no other likely explanations for the very thickened appearance of the aortic valve leaflets. I'm concerned that the tricuspid valve may be involved as well. The images of the mitral valve looked reasonably normal. Both the aortic and mitral valves are functioning normally without any significant regurgitation or sign of perivalvular leak. There is at least moderate tricuspid regurgitation. Given the fact that this patient has had 2 previous cardiac surgeries for endocarditis and her recent surgery was extremely complicated, I would not consider a third time operation as a reasonable option at this time.  Fortunately, the Enterococcus species that has grown is sensitive to both ampicillin and vancomycin.   Plan:  I would recommend at least 6-8 weeks of intravenous antibiotics followed by repeat transesophageal echocardiogram while still on antibiotic therapy. If the patient continues to do well clinically and followup TEE looks good, I would recommend lifelong oral antibiotic suppression. On the other hand, intravenous antibiotics should be continued indefinitely if blood cultures fail to clear and/or repeat TEE shows signs of persistent infection. Repeat surgery should not be considered an option.  This type of patient does  not neatly fit into a category where use of the conventional guidelines for the treatment of endocarditis makes sense.  Finally, given the patient's persistent severe headaches I would recommend getting an MRI of the brain.  In addition, she had severe retinitis at the time of her hospitalization last fall at [facility].  Followup with ophthalmology might be prudent.    Salvatore Decent. Cornelius Moras, MD 02/11/2013 1:54 PM  I spent in excess of 90 minutes of time directly involved in the conduct of this consultation.

## 2013-02-11 NOTE — Progress Notes (Signed)
Subjective:  C/O headache.  Objective:  Vital Signs in the last 24 hours: Temp:  [98 F (36.7 C)-101.4 F (38.6 C)] 98.7 F (37.1 C) (01/30 0435) Pulse Rate:  [81-93] 81 (01/30 0435) Resp:  [16-19] 16 (01/30 0435) BP: (92-111)/(51-65) 92/51 mmHg (01/30 0435) SpO2:  [97 %-100 %] 98 % (01/30 0435) Weight:  [169 lb 1.6 oz (76.703 kg)] 169 lb 1.6 oz (76.703 kg) (01/29 2105)  Intake/Output from previous day:  Intake/Output Summary (Last 24 hours) at 02/11/13 0849 Last data filed at 02/11/13 0608  Gross per 24 hour  Intake   2033 ml  Output      0 ml  Net   2033 ml    Physical Exam: General appearance: alert, cooperative and no distress Lungs: few crackles Lt base Heart: regular rate and rhythm and 2/6 syst murmur LSB   Rate: 82  Rhythm: normal sinus rhythm  Lab Results:  Recent Labs  02/10/13 0326 02/11/13 0448  WBC 8.6 10.1  HGB 9.7* 10.1*  PLT 162 181    Recent Labs  02/10/13 0326 02/11/13 0448  NA 139 140  K 3.3* 3.9  CL 105 103  CO2 19 22  GLUCOSE 120* 117*  BUN 3* 4*  CREATININE 0.73 0.86   No results found for this basename: TROPONINI, CK, MB,  in the last 72 hours No results found for this basename: INR,  in the last 72 hours  Imaging: Imaging results have been reviewed  Cardiac Studies:TEE- 02/09/13-The bioprosthetic aortic valve showed significant thickening along the leaflet tips. This is concerning for endocarditis, especially as the valve is relatively new (from 2014).   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 ABs per ID. She is complaining of a head ache ? CT head to r/o embolic CVA, MD to see.  Pt seen by CT surgery, redo surgery is high risk, last option. Plan is  for long term anticiotics.   Corine Shelter PA-C Beeper [phone] 02/11/2013, 8:49 AM   I have personally seen and examined this patient with Corine Shelter, PA-C. I agree with the assessment and plan as outlined above. 43 yo female with history of IV drug abuse, TVR and MVR followed by endocarditis and then redo TVR, MVR and AVR at [facility] September 2014 with permanent pacemaker in place (epicardial pacemaker leads) who is admitted with sepsis, positive blood cultures for enterococcus. TEE 02/09/13 with immobile leaflet TVR, thickening of tips of aortic valve leaflets concerning for endocarditis. She has been seen in our office by Dr. Eden Emms in the past.  1. Chest pain: Her pain is atypical. Worsened with inspiration. CTA chest 02/07/13 without evidence of PE in large vessels. Cardiac markers are negative x 3. She is known to have normal coronary arteries by cath 2012 per records. Chronic pain syndrome. No ischemic evaluation at this time.  2. S/p Mitral valve replacement, Triscuspid valve replacement, aortic valve replacement: Most recent procedures September 2014 at [facility]. Denies IV drug use since then. UDS only positive for Thc. Presenting with fevers, abdominal pain, nausea, possibly c/w sepsis. Blood cultures are positive for enterococcus. TEE 02/10/12: the bioprosthetic mitral valve appeared to function normally with trivial MR and no vegetation. The bioprosthetic tricuspid  valve did not appear to have a vegetation but one of the leaflets appeared less mobile and there was moderate TR. The mean gradient across the tricuspid valve was elevated at 8 mmHg suggesting possibility of a degree of stenosis. The bioprosthetic aortic valve showed significant thickening along the leaflet tips. This is concerning for endocarditis, especially as the valve is relatively new (from 2014). There was no significant aortic insufficiency. Normal LV size and systolic function, EF 60%. Probably normal RV size and systolic function but  not seen well. She has epicardial pacemaker lead. She was seen by Dr. Cornelius Moraswen with CT surgery. No current indications for surgery. Will continue IV antibiotics for now. She would be high risk for repeat operative procedure as she has now had two open sternotomy procedures. 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  3. Sepsis/Enterococcus bacteremia: Long term antibiotics per primary team/ID.   Her cardiac follow up will be with Dr. Charlton HawsPeter Nishan after discharge.   MCALHANY,CHRISTOPHER 02/11/2013 10:04 AM

## 2013-02-11 NOTE — Progress Notes (Signed)
[place] for Infectious Disease  Date of Admission:  02/06/2013  Antibiotics: Antibiotics Given (last 72 hours)   Date/Time Action Medication Dose Rate   02/08/13 1734 Given   vancomycin (VANCOCIN) IVPB 1000 mg/200 mL premix 1,000 mg 200 mL/hr   02/08/13 2240 Given   gentamicin (GARAMYCIN) IVPB 60 mg 60 mg 100 mL/hr   02/09/13 0205 Given   vancomycin (VANCOCIN) IVPB 1000 mg/200 mL premix 1,000 mg 200 mL/hr   02/09/13 1031 Given   vancomycin (VANCOCIN) IVPB 1000 mg/200 mL premix 1,000 mg 200 mL/hr   02/09/13 1046 Given   gentamicin (GARAMYCIN) IVPB 60 mg 60 mg 100 mL/hr   02/09/13 1238 Given   ampicillin (OMNIPEN) 2 g in sodium chloride 0.9 % 50 mL IVPB 2 g 150 mL/hr   02/09/13 1600 Given   ampicillin (OMNIPEN) 2 g in sodium chloride 0.9 % 50 mL IVPB 2 g 150 mL/hr   02/09/13 1900 Given   gentamicin (GARAMYCIN) IVPB 60 mg 60 mg 100 mL/hr   02/09/13 2004 Given   ampicillin (OMNIPEN) 2 g in sodium chloride 0.9 % 50 mL IVPB 2 g 150 mL/hr   02/09/13 2340 Given   ampicillin (OMNIPEN) 2 g in sodium chloride 0.9 % 50 mL IVPB 2 g 150 mL/hr   02/10/13 0133 Given   gentamicin (GARAMYCIN) IVPB 60 mg 60 mg 100 mL/hr   02/10/13 0444 Given   ampicillin (OMNIPEN) 2 g in sodium chloride 0.9 % 50 mL IVPB 2 g 150 mL/hr   02/10/13 6659 Given   ampicillin (OMNIPEN) 2 g in sodium chloride 0.9 % 50 mL IVPB 2 g 150 mL/hr   02/10/13 1010 Given   gentamicin (GARAMYCIN) IVPB 60 mg 60 mg 100 mL/hr   02/10/13 1155 Given   ampicillin (OMNIPEN) 2 g in sodium chloride 0.9 % 50 mL IVPB 2 g 150 mL/hr   02/10/13 1553 Given   ampicillin (OMNIPEN) 2 g in sodium chloride 0.9 % 50 mL IVPB 2 g 150 mL/hr   02/10/13 1828 Given   gentamicin (GARAMYCIN) IVPB 60 mg 60 mg 100 mL/hr   02/10/13 1955 Given   ampicillin (OMNIPEN) 2 g in sodium chloride 0.9 % 50 mL IVPB 2 g 150 mL/hr   02/11/13 0023 Given   ampicillin (OMNIPEN) 2 g in sodium chloride 0.9 % 50 mL IVPB 2 g 150 mL/hr   02/11/13 0131 Given   gentamicin (GARAMYCIN) IVPB 60 mg 60 mg 100 mL/hr   02/11/13 0402 Given   ampicillin (OMNIPEN) 2 g in sodium chloride 0.9 % 50 mL IVPB 2 g 150 mL/hr   02/11/13 0843 Given   ampicillin (OMNIPEN) 2 g in sodium chloride 0.9 % 50 mL IVPB 2 g 150 mL/hr   02/11/13 1012 Given   gentamicin (GARAMYCIN) IVPB 60 mg 60 mg 100 mL/hr   02/11/13 1309 Given   ampicillin (OMNIPEN) 2 g in sodium chloride 0.9 % 50 mL IVPB 2 g 150 mL/hr      Subjective: Patient currently getting picc line  Objective: Temp:  [98.7 F (37.1 C)-101.4 F (38.6 C)] 98.7 F (37.1 C) (01/30 1242) Pulse Rate:  [81-93] 93 (01/30 1242) Resp:  [16-21] 21 (01/30 1242) BP: (92-107)/(50-70) 107/70 mmHg (01/30 1242) SpO2:  [96 %-100 %] 99 % (01/30 1242) Weight:  [169 lb 1.6 oz (76.703 kg)] 169 lb 1.6 oz (76.703 kg) (01/29 2105)  Unable to assess   Lab Results Lab Results  Component Value Date   WBC 10.1 02/11/2013  HGB 10.1* 02/11/2013   HCT 30.4* 02/11/2013   MCV 79.2 02/11/2013   PLT 181 02/11/2013    Lab Results  Component Value Date   CREATININE 0.86 02/11/2013   BUN 4* 02/11/2013   NA 140 02/11/2013   K 3.9 02/11/2013   CL 103 02/11/2013   CO2 22 02/11/2013    Lab Results  Component Value Date   ALT 6 02/07/2013   AST 10 02/07/2013   ALKPHOS 87 02/07/2013   BILITOT 0.9 02/07/2013      Microbiology: Recent Results (from the past 240 hour(s))  CULTURE, BLOOD (ROUTINE X 2)     Status: None   Collection Time    02/06/13  8:20 PM      Result Value Range Status   Specimen Description BLOOD RIGHT ARM   Final   Special Requests BOTTLES DRAWN AEROBIC AND ANAEROBIC 10CC EA   Final   Culture  Setup Time     Final   Value: 02/07/2013 01:15     Performed at [facility]   Culture     Final   Value: ENTEROCOCCUS SPECIES     Note: COMBINATION THERAPY OF HIGH DOSE AMPICILLIN OR VANCOMYCIN, PLUS AN AMINOGLYCOSIDE, IS USUALLY INDICATED FOR SERIOUS ENTEROCOCCAL INFECTIONS.     Note: Gram Stain Report Called to,Read  Back By and Verified With: STEPHANIE [email or web address]_[email or web address]  ON [id number] BY [facility]     Performed at [facility]   Report Status 02/09/2013 FINAL   Final   Organism ID, Bacteria ENTEROCOCCUS SPECIES   Final  CULTURE, BLOOD (ROUTINE X 2)     Status: None   Collection Time    02/06/13  8:30 PM      Result Value Range Status   Specimen Description BLOOD LEFT HAND   Final   Special Requests BOTTLES DRAWN AEROBIC ONLY 8CC   Final   Culture  Setup Time     Final   Value: 02/07/2013 01:15     Performed at [facility]   Culture     Final   Value: ENTEROCOCCUS SPECIES     Note: SUSCEPTIBILITIES PERFORMED ON PREVIOUS CULTURE WITHIN THE LAST 5 DAYS.     Note: CRITICAL RESULT CALLED TO, READ BACK BY AND VERIFIED WITH: STEPHANIE [email or web address]  ON [id number] BY [facility]     Performed at [facility]   Report Status 02/09/2013 FINAL   Final  URINE CULTURE     Status: None   Collection Time    02/06/13 10:16 PM      Result Value Range Status   Specimen Description URINE, CATHETERIZED   Final   Special Requests Normal   Final   Culture  Setup Time     Final   Value: 02/07/2013 04:04     Performed at [place]     Final   Value: NO GROWTH     Performed at [facility]   Culture     Final   Value: NO GROWTH     Performed at [facility]   Report Status 02/08/2013 FINAL   Final  MRSA PCR SCREENING     Status: None   Collection Time    02/07/13  2:28 AM      Result Value Range Status   MRSA by PCR NEGATIVE  NEGATIVE Final   Comment:            The GeneXpert MRSA Assay (FDA     approved for  NASAL specimens     only), is one component of a     comprehensive MRSA colonization     surveillance program. It is not     intended to diagnose MRSA     infection nor to guide or     monitor treatment for     MRSA infections.  RESPIRATORY VIRUS PANEL     Status: None   Collection Time    02/07/13 12:15 PM      Result Value Range Status   Source - RVPAN NASAL  SWAB   Corrected   Comment: CORRECTED ON 01/27 AT 2204: PREVIOUSLY REPORTED AS NASAL SWAB   Respiratory Syncytial Virus A NOT DETECTED   Final   Respiratory Syncytial Virus B NOT DETECTED   Final   Influenza A NOT DETECTED   Final   Influenza B NOT DETECTED   Final   Parainfluenza 1 NOT DETECTED   Final   Parainfluenza 2 NOT DETECTED   Final   Parainfluenza 3 NOT DETECTED   Final   Metapneumovirus NOT DETECTED   Final   Rhinovirus NOT DETECTED   Final   Adenovirus NOT DETECTED   Final   Influenza A H1 NOT DETECTED   Final   Influenza A H3 NOT DETECTED   Final   Comment: (NOTE)           Normal Reference Range for each Analyte: NOT DETECTED     Testing performed using the Luminex xTAG Respiratory Viral Panel test     kit.     This test was developed and its performance characteristics determined     by [facility]. It has not been cleared or approved by the [place]     Food and Drug Administration. This test is used for clinical purposes.     It should not be regarded as investigational or for research. This     laboratory is certified under the [place] (CLIA) as qualified to perform high complexity     clinical laboratory testing.     Performed at [facility], BLOOD (ROUTINE X 2)     Status: None   Collection Time    02/08/13 10:53 AM      Result Value Range Status   Specimen Description BLOOD RIGHT ARM   Final   Special Requests BOTTLES DRAWN AEROBIC AND ANAEROBIC 10CC   Final   Culture  Setup Time     Final   Value: 02/08/2013 16:31     Performed at [facility]   Culture     Final   Value:        BLOOD CULTURE RECEIVED NO GROWTH TO DATE CULTURE WILL BE HELD FOR 5 DAYS BEFORE ISSUING A FINAL NEGATIVE REPORT     Performed at [facility]   Report Status PENDING   Incomplete  CULTURE, BLOOD (ROUTINE X 2)     Status: None   Collection Time    02/08/13 10:54 AM      Result Value Range Status     Specimen Description BLOOD LEFT HAND   Final   Special Requests     Final   Value: BOTTLES DRAWN AEROBIC AND ANAEROBIC 10CC BLUE  5CC RED   Culture  Setup Time     Final   Value: 02/08/2013 16:31     Performed at [facility]   Culture     Final  Value:        BLOOD CULTURE RECEIVED NO GROWTH TO DATE CULTURE WILL BE HELD FOR 5 DAYS BEFORE ISSUING A FINAL NEGATIVE REPORT     Performed at [facility]   Report Status PENDING   Incomplete    Studies/Results: No results found.  Assessment/Plan: 1) Enterococcal PVE - aortic valve.  Repeat cultures have remained negative.  On ampicillin and gentamicin and will need for at least 6 weeks.  Will need cardiology follow up prior to stopping antibiotics to confirm complete resolution (stop date presumptively March 9th).  Seen by Dr. Roxy Manns and to follow up with Dr. Johnsie Cancel at discharge.   HH for weekly cbc with diff, cmp, twice weekly gent trough in the first 2 weeks  Harriet Sutphen, Herbie [place], [place] [facility] [email or web address] [phone] pager   [phone] cell 02/11/2013, 2:03 PM

## 2013-02-12 DIAGNOSIS — I959 Hypotension, unspecified: Secondary | ICD-10-CM

## 2013-02-12 NOTE — Progress Notes (Signed)
[facility] PROGRESS NOTE  [name] GNF:[id number] DOB: [birth year] DOA: 02/06/2013 PCP: Provider Not In System   Brief narrative:  43 year old female patient with history of recurrent gram + cocci endocarditis. She previously had undergone bioprosthetic mitral tricuspid valve replacements but had recurrent endocarditis in Sept 2014 and subsequently underwent redo mitral and tricuspid valve replacements and also aortic valve replacement all with bioprosthetic devices. 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  She presented to the emergency department because of weakness and chest pain as well as dizziness. States had been ongoing for 4 days and felt like shocks. In the emergency department the patient's pacemaker was interrogated and was functioning appropriately.  After arrival to the ER she was also found to be hypotensive with systolic blood pressures in the 80s. 3 days prior patient has had blood work checked in the ER and now her hemoglobin had decreased by 3 Deborah. Stool for occult blood was positive without any frank rectal bleeding or melena seen on rectal exam per ER physician. She subsequently received 1 L of normal saline. Because of the hypotension a sepsis workup was initiated so urine and blood cultures were obtained and influenza PCR was sent off. She was empirically started on vancomycin and Zosyn. Cardiologist was also consulted.   Assessment/Plan:  1. SIRS / Enterococcus Bacteremia - TEE 1/28 showed thickening leaflets Ao valve concerning for endocarditis   - 2/2 blood cx's positive (1/25) - repeat blood cx's obtained 1/27- neg PICC line placed 1/30   - pharmacy dosing anbx's; adjusted anbx's to gent and vanco  - ID to follow; plan to d/c home [place] on IV atx (arranged), but patient is still spiking fever 1/30; will monitor another 24-48 hours   2. History of bacterial endocarditis / s/p recurrent bioprosthetic valve  replacements  -1st episode was MSSA, 2nd episode was strep viridans  -blood cx's here positive for enterococcus(see above)- pt denies IVDA  -per ID:  * continue ampicillin 2gm IV Q 4hr plus gentamicin, since gent trough today < 1.  * can place picc line tomorrow if cx from 1/27 remain negative  * will need 6 wks of IV antibiotics as well as follow up.  * We will seen her back in ID clinic in 2 and 6 wks but also have her follow up with her cardiologists at end of course of therapy  Per CT surgery" IV atx 6-8 weeks --> repeat TEE while on IV atx, if TEE looks good, recommended lifelong oral antibiotic suppression. On the other hand, intravenous antibiotics should be continued indefinitely if blood cultures fail to clear and/or repeat TEE shows signs of persistent infection" 3. Dehydration/ Hypotension  -resolving, Continue IV fluids gentle  4. Chest pain, non-cardiac; Appreciate Cardiology assistance  -Normal coronaries per cath 2012 therefore no additional ischemic evaluation indicated  -When necessary Mylanta and Ultram and Percocet (see below)  -Cont PPI (see below)  -exam c/w musc-skel pain but also epigastric pain concerning for GI etiology  -add Voltaren gel  4. ?? Chronic pain vs post op pain  -pt admits was dc'd on Methadone after last admit at [facility] for valve repair  -had been using sparingly due to inability to afford refill - ran out 2 weeks ago  5. Pulmonary HTN - 62 mmHg  -Noted on echo prior to recent valve replacement  6. Anemia/Heme positive stool  -Hemoglobin had decreased from 13 to 9 with previous readings in the fall  of 2014 much higher in between 14 and 15  -consulted GI - rec OP evaluation  -Anemia panel c/w low iron so consider IV dosing once bacteremia better controlled - no evidence of large volume gross blood loss  7. Hypokalemia  -Oral replete  8. Hypothyroidism  -TSH 25.123 - suspect noncompliance w/ medications  -Free T4 normal  Substance abuse  -UDS pos for  THC only 9. 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 Will repeat MRI, pend    Code Status: full Family Communication: d/w patient (indicate person spoken with, relationship, and if by phone, the number) Disposition Plan: [place] pend clinical improvement    Consultants:  Cardiology, CT surgery, ID  Procedures:  TEE  Antibiotics: Antibiotics:  Zosyn 1/25 >> 1/26  Vancomycin 1/25 > 1/28  Gentamicin 1/26 >>>  Ampicillin 1/28 >>  Rifampin 1/26 >>1/27   HPI/Subjective: alert  Objective: Filed Vitals:   02/12/13 0514  BP: 89/55  Pulse: 84  Temp: 99.1 F (37.3 C)  Resp: 16    Intake/Output Summary (Last 24 hours) at 02/12/13 0927 Last data filed at 02/12/13 0700  Gross per 24 hour  Intake   1340 ml  Output    602 ml  Net    738 ml   Filed Weights   02/10/13 0425 02/10/13 2105 02/11/13 2137  Weight: 75.3 kg (166 lb 0.1 oz) 76.703 kg (169 lb 1.6 oz) 74.617 kg (164 lb 8 oz)    Exam:   General:  alert  Cardiovascular: s1,s2 rrr  Respiratory: CTA BL  Abdomen: soft, nt, nd   Musculoskeletal: no le edema   Data Reviewed: Basic Metabolic Panel:  Recent Labs Lab 02/07/13 0350 02/08/13 0357 02/09/13 0419 02/10/13 0326 02/11/13 0448  NA 138 135* 135* 139 140  K 3.3* 3.4* 3.7 3.3* 3.9  CL 105 106 104 105 103  CO2 16* 17* 18* 19 22  GLUCOSE 85 147* 114* 120* 117*  BUN 9 6 3* 3* 4*  CREATININE 0.65 0.68 0.75 0.73 0.86  CALCIUM 7.8* 7.4* 8.4 8.7 8.7   Liver Function Tests:  Recent Labs Lab 02/06/13 1950 02/07/13 0350  AST 12 10  ALT 7 6  ALKPHOS 93 87  BILITOT 0.8 0.9  PROT 8.1 7.4  ALBUMIN 3.4* 2.9*    Recent Labs Lab 02/07/13 0350  LIPASE 16   No results found for this basename: AMMONIA,  in the last 168 hours CBC:  Recent Labs Lab 02/06/13 1950 02/07/13 0350 02/08/13 0357 02/09/13 0419 02/10/13 0326 02/11/13 0448  WBC 9.2 7.0 4.5 6.6 8.6 10.1   NEUTROABS 7.5 5.3  --   --   --   --   HGB 11.3* 10.2* 9.0* 9.6* 9.7* 10.1*  HCT 33.2* 30.7* 27.0* 28.2* 29.1* 30.4*  MCV 78.5 79.1 79.4 79.2 79.1 79.2  PLT 151 128* 94* 128* 162 181   Cardiac Enzymes:  Recent Labs Lab 02/06/13 1951 02/07/13 0350 02/07/13 1002 02/07/13 1625  TROPONINI <0.30 <0.30 <0.30 <0.30   BNP (last 3 results)  Recent Labs  09/25/12 0936 02/06/13 1951  PROBNP 6304.0* 1450.0*   CBG: No results found for this basename: GLUCAP,  in the last 168 hours  Recent Results (from the past 240 hour(s))  CULTURE, BLOOD (ROUTINE X 2)     Status: None   Collection Time    02/06/13  8:20 PM      Result Value Range Status   Specimen Description  BLOOD RIGHT ARM   Final   Special Requests BOTTLES DRAWN AEROBIC AND ANAEROBIC 10CC EA   Final   Culture  Setup Time     Final   Value: 02/07/2013 01:15     Performed at [facility]   Culture     Final   Value: ENTEROCOCCUS SPECIES     Note: COMBINATION THERAPY OF HIGH DOSE AMPICILLIN OR VANCOMYCIN, PLUS AN AMINOGLYCOSIDE, IS USUALLY INDICATED FOR SERIOUS ENTEROCOCCAL INFECTIONS.     Note: Gram Stain Report Called to,Read Back By and Verified With: STEPHANIE [email or web address]  ON [id number] BY [facility]     Performed at [facility]   Report Status 02/09/2013 FINAL   Final   Organism ID, Bacteria ENTEROCOCCUS SPECIES   Final  CULTURE, BLOOD (ROUTINE X 2)     Status: None   Collection Time    02/06/13  8:30 PM      Result Value Range Status   Specimen Description BLOOD LEFT HAND   Final   Special Requests BOTTLES DRAWN AEROBIC ONLY 8CC   Final   Culture  Setup Time     Final   Value: 02/07/2013 01:15     Performed at [facility]   Culture     Final   Value: ENTEROCOCCUS SPECIES     Note: SUSCEPTIBILITIES PERFORMED ON PREVIOUS CULTURE WITHIN THE LAST 5 DAYS.     Note: CRITICAL RESULT CALLED TO, READ BACK BY AND VERIFIED WITH: STEPHANIE [email or web address]  ON [id number] BY [facility]     Performed at [facility]    Report Status 02/09/2013 FINAL   Final  URINE CULTURE     Status: None   Collection Time    02/06/13 10:16 PM      Result Value Range Status   Specimen Description URINE, CATHETERIZED   Final   Special Requests Normal   Final   Culture  Setup Time     Final   Value: 02/07/2013 04:04     Performed at [facility]     Final   Value: NO GROWTH     Performed at [facility]   Culture     Final   Value: NO GROWTH     Performed at [facility]   Report Status 02/08/2013 FINAL   Final  MRSA PCR SCREENING     Status: None   Collection Time    02/07/13  2:28 AM      Result Value Range Status   MRSA by PCR NEGATIVE  NEGATIVE Final   Comment:            The GeneXpert MRSA Assay (FDA     approved for NASAL specimens     only), is one component of a     comprehensive MRSA colonization     surveillance program. It is not     intended to diagnose MRSA     infection nor to guide or     monitor treatment for     MRSA infections.  RESPIRATORY VIRUS PANEL     Status: None   Collection Time    02/07/13 12:15 PM      Result Value Range Status   Source - RVPAN NASAL SWAB   Corrected   Comment: CORRECTED ON 01/27 AT 2204: PREVIOUSLY REPORTED AS NASAL SWAB   Respiratory Syncytial Virus A NOT DETECTED   Final   Respiratory Syncytial Virus B NOT DETECTED   Final   Influenza  A NOT DETECTED   Final   Influenza B NOT DETECTED   Final   Parainfluenza 1 NOT DETECTED   Final   Parainfluenza 2 NOT DETECTED   Final   Parainfluenza 3 NOT DETECTED   Final   Metapneumovirus NOT DETECTED   Final   Rhinovirus NOT DETECTED   Final   Adenovirus NOT DETECTED   Final   Influenza A H1 NOT DETECTED   Final   Influenza A H3 NOT DETECTED   Final   Comment: (NOTE)           Normal Reference Range for each Analyte: NOT DETECTED     Testing performed using the Luminex xTAG Respiratory Viral Panel test     kit.     This test was developed and its performance characteristics  determined     by [facility]. It has not been cleared or approved by the [place]     Food and Drug Administration. This test is used for clinical purposes.     It should not be regarded as investigational or for research. This     laboratory is certified under the [place] (CLIA) as qualified to perform high complexity     clinical laboratory testing.     Performed at [place], BLOOD (ROUTINE X 2)     Status: None   Collection Time    02/08/13 10:53 AM      Result Value Range Status   Specimen Description BLOOD RIGHT ARM   Final   Special Requests BOTTLES DRAWN AEROBIC AND ANAEROBIC 10CC   Final   Culture  Setup Time     Final   Value: 02/08/2013 16:31     Performed at [facility]   Culture     Final   Value:        BLOOD CULTURE RECEIVED NO GROWTH TO DATE CULTURE WILL BE HELD FOR 5 DAYS BEFORE ISSUING A FINAL NEGATIVE REPORT     Performed at [facility]   Report Status PENDING   Incomplete  CULTURE, BLOOD (ROUTINE X 2)     Status: None   Collection Time    02/08/13 10:54 AM      Result Value Range Status   Specimen Description BLOOD LEFT HAND   Final   Special Requests     Final   Value: BOTTLES DRAWN AEROBIC AND ANAEROBIC 10CC BLUE  5CC RED   Culture  Setup Time     Final   Value: 02/08/2013 16:31     Performed at [facility]   Culture     Final   Value:        BLOOD CULTURE RECEIVED NO GROWTH TO DATE CULTURE WILL BE HELD FOR 5 DAYS BEFORE ISSUING A FINAL NEGATIVE REPORT     Performed at [facility]   Report Status PENDING   Incomplete     Studies: Dg Chest Port 1 View  02/11/2013   CLINICAL DATA:  PICC line insertion  EXAM: PORTABLE CHEST - 1 VIEW  COMPARISON:  02/07/2013  FINDINGS: Cardiomediastinal silhouette is stable. Status post median sternotomy and cardiac valve replacement. Streaky right basilar atelectasis or infiltrate. There is right arm PICC line with tip  in SVC. No diagnostic pneumothorax.  IMPRESSION: Right arm PICC line with tip in SVC. No diagnostic pneumothorax. Streaky right basilar atelectasis or infiltrate.   Electronically Signed   By: Julien Girt  Pop M.D.   On: 02/11/2013 15:26    Scheduled Meds: . ampicillin (OMNIPEN) IV  2 Deborah Intravenous Q4H  . diclofenac sodium  2 Deborah Topical Q6H  . gentamicin  60 mg Intravenous Q8H  . levothyroxine  75 mcg Oral QAC breakfast  . pantoprazole  40 mg Oral BID  . 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 If 7PM-7AM, please contact night-coverage at [email or web address], password [facility] 02/12/2013, 9:27 AM  LOS: 6 days

## 2013-02-13 ENCOUNTER — Encounter (HOSPITAL_COMMUNITY): Payer: Self-pay | Admitting: *Deleted

## 2013-02-13 ENCOUNTER — Inpatient Hospital Stay (HOSPITAL_COMMUNITY): Payer: Medicaid Other

## 2013-02-13 DIAGNOSIS — R51 Headache: Secondary | ICD-10-CM

## 2013-02-13 DIAGNOSIS — R519 Headache, unspecified: Secondary | ICD-10-CM | POA: Diagnosis present

## 2013-02-13 LAB — CBC
HCT: 30.1 % — ABNORMAL LOW (ref 36.0–46.0)
Hemoglobin: 10.1 g/dL — ABNORMAL LOW (ref 12.0–15.0)
MCH: 26.8 pg (ref 26.0–34.0)
MCHC: 33.6 g/dL (ref 30.0–36.0)
MCV: 79.8 fL (ref 78.0–100.0)
Platelets: 219 10*3/uL (ref 150–400)
RBC: 3.77 MIL/uL — ABNORMAL LOW (ref 3.87–5.11)
RDW: 18 % — ABNORMAL HIGH (ref 11.5–15.5)
WBC: 11.3 10*3/uL — ABNORMAL HIGH (ref 4.0–10.5)

## 2013-02-13 LAB — BASIC METABOLIC PANEL
BUN: 7 mg/dL (ref 6–23)
CALCIUM: 8.6 mg/dL (ref 8.4–10.5)
CO2: 24 mEq/L (ref 19–32)
CREATININE: 0.9 mg/dL (ref 0.50–1.10)
Chloride: 95 mEq/L — ABNORMAL LOW (ref 96–112)
GFR calc Af Amer: 89 mL/min — ABNORMAL LOW (ref >90)
GFR, EST NON AFRICAN AMERICAN: 77 mL/min — AB (ref >90)
GLUCOSE: 120 mg/dL — AB (ref 70–99)
Potassium: 3.8 mEq/L (ref 3.7–5.3)
SODIUM: 133 meq/L — AB (ref 137–147)

## 2013-02-13 MED ORDER — IOHEXOL 300 MG/ML  SOLN
80.0000 mL | Freq: Once | INTRAMUSCULAR | Status: AC | PRN
  Administered 2013-02-13: 80 mL via INTRAVENOUS

## 2013-02-13 MED ORDER — BUTALBITAL-APAP-CAFFEINE 50-325-40 MG PO TABS
1.0000 | ORAL_TABLET | Freq: Once | ORAL | Status: AC
  Administered 2013-02-13: 1 via ORAL
  Filled 2013-02-13: qty 1

## 2013-02-13 MED ORDER — BUTALBITAL-APAP-CAFFEINE 50-325-40 MG PO TABS
1.0000 | ORAL_TABLET | Freq: Four times a day (QID) | ORAL | Status: DC | PRN
  Administered 2013-02-13: 1 via ORAL
  Filled 2013-02-13: qty 1

## 2013-02-13 NOTE — Progress Notes (Signed)
[facility] PROGRESS NOTE  [name] GNF:[id number] DOB: [birth year] DOA: 02/06/2013 PCP: Provider Not In System   Brief narrative:  43 year old female patient with history of recurrent gram + cocci endocarditis. She previously had undergone bioprosthetic mitral tricuspid valve replacements but had recurrent endocarditis in Sept 2014 and subsequently underwent redo mitral and tricuspid valve replacements and also aortic valve replacement all with bioprosthetic devices. 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  She presented to the emergency department because of weakness and chest pain as well as dizziness. States had been ongoing for 4 days and felt like shocks. In the emergency department the patient's pacemaker was interrogated and was functioning appropriately.  After arrival to the ER she was also found to be hypotensive with systolic blood pressures in the 80s. 3 days prior patient has had blood work checked in the ER and now her hemoglobin had decreased by 3 g. Stool for occult blood was positive without any frank rectal bleeding or melena seen on rectal exam per ER physician. She subsequently received 1 L of normal saline. Because of the hypotension a sepsis workup was initiated so urine and blood cultures were obtained and influenza PCR was sent off. She was empirically started on vancomycin and Zosyn. Cardiologist was also consulted.   Assessment/Plan:  1. SIRS / Enterococcus Bacteremia - TEE 1/28 showed thickening leaflets Ao valve concerning for endocarditis   - 2/2 blood cx's positive (1/25) - repeat blood cx's obtained 1/27- neg PICC line placed 1/30   - pharmacy dosing anbx's; adjusted anbx's to gent and vanco  - ID to follow; plan to d/c home [place] on IV atx (arranged), but patient is still spiking fever 1/30; will monitor another 24-48 hours   2. History of bacterial endocarditis / s/p recurrent bioprosthetic valve  replacements  -1st episode was MSSA, 2nd episode was strep viridans  -blood cx's here positive for enterococcus(see above)- pt denies IVDA  -per ID:  * continue ampicillin 2gm IV Q 4hr plus gentamicin, since gent trough today < 1.  * can place picc line tomorrow if cx from 1/27 remain negative  * will need 6 wks of IV antibiotics as well as follow up.  * We will seen her back in ID clinic in 2 and 6 wks but also have her follow up with her cardiologists at end of course of therapy  Per CT surgery" IV atx 6-8 weeks --> repeat TEE while on IV atx, if TEE looks good, recommended lifelong oral antibiotic suppression. On the other hand, intravenous antibiotics should be continued indefinitely if blood cultures fail to clear and/or repeat TEE shows signs of persistent infection" 3. Dehydration/ Hypotension  -resolving, Continue IV fluids gentle  4. Chest pain, non-cardiac; Appreciate Cardiology assistance  -Normal coronaries per cath 2012 therefore no additional ischemic evaluation indicated  -When necessary Mylanta and Ultram and Percocet (see below)  -Cont PPI (see below)  -exam c/w musc-skel pain but also epigastric pain concerning for GI etiology  -add Voltaren gel  4. ?? Chronic pain vs post op pain  -pt admits was dc'd on Methadone after last admit at [facility] for valve repair  -had been using sparingly due to inability to afford refill - ran out 2 weeks ago  5. Pulmonary HTN - 62 mmHg  -Noted on echo prior to recent valve replacement  6. Anemia/Heme positive stool  -Hemoglobin had decreased from 13 to 9 with previous readings in the fall  of 2014 much higher in between 14 and 15  -consulted GI - rec OP evaluation  -Anemia panel c/w low iron so consider IV dosing once bacteremia better controlled - no evidence of large volume gross blood loss  7. Hypokalemia  -Oral replete  8. Hypothyroidism  -TSH 25.123 - suspect noncompliance w/ medications  -Free T4 normal  Substance abuse  -UDS pos for  THC only 9. Headaches, neuro exam non focal: has pacemaker. Cancel MRI. Get CT with/without contrast r/o septic emboli   Code Status: full Family Communication: d/w patient (indicate person spoken with, relationship, and if by phone, the number) Disposition Plan: [place] pend clinical improvement    Consultants:  Cardiology, CT surgery, ID  Procedures:  TEE  Antibiotics: Antibiotics:  Zosyn 1/25 >> 1/26  Vancomycin 1/25 > 1/28  Gentamicin 1/26 >>>  Ampicillin 1/28 >>  Rifampin 1/26 >>1/27   HPI/Subjective: C/o left sided headache since 1/21  Objective: Filed Vitals:   02/13/13 0900  BP: 94/60  Pulse: 95  Temp: 97.7 F (36.5 C)  Resp: 18    Intake/Output Summary (Last 24 hours) at 02/13/13 1301 Last data filed at 02/12/13 1700  Gross per 24 hour  Intake    290 ml  Output      0 ml  Net    290 ml   Filed Weights   02/10/13 2105 02/11/13 2137 02/12/13 2109  Weight: 76.703 kg (169 lb 1.6 oz) 74.617 kg (164 lb 8 oz) 74.027 kg (163 lb 3.2 oz)    Exam:   General:  Uncomfortable. Head in hands  Cardiovascular: s1,s2 rrr  Respiratory: CTA BL  Abdomen: soft, nt, nd   Musculoskeletal: no le edema   Data Reviewed: Basic Metabolic Panel:  Recent Labs Lab 02/08/13 0357 02/09/13 0419 02/10/13 0326 02/11/13 0448 02/13/13 0500  NA 135* 135* 139 140 133*  K 3.4* 3.7 3.3* 3.9 3.8  CL 106 104 105 103 95*  CO2 17* 18* $Remov'19 22 24  'vXDfEs$ GLUCOSE 147* 114* 120* 117* 120*  BUN 6 3* 3* 4* 7  CREATININE 0.68 0.75 0.73 0.86 0.90  CALCIUM 7.4* 8.4 8.7 8.7 8.6   Liver Function Tests:  Recent Labs Lab 02/06/13 1950 02/07/13 0350  AST 12 10  ALT 7 6  ALKPHOS 93 87  BILITOT 0.8 0.9  PROT 8.1 7.4  ALBUMIN 3.4* 2.9*    Recent Labs Lab 02/07/13 0350  LIPASE 16   No results found for this basename: AMMONIA,  in the last 168 hours CBC:  Recent Labs Lab 02/06/13 1950 02/07/13 0350 02/08/13 0357 02/09/13 0419 02/10/13 0326 02/11/13 0448 02/13/13 0500  WBC  9.2 7.0 4.5 6.6 8.6 10.1 11.3*  NEUTROABS 7.5 5.3  --   --   --   --   --   HGB 11.3* 10.2* 9.0* 9.6* 9.7* 10.1* 10.1*  HCT 33.2* 30.7* 27.0* 28.2* 29.1* 30.4* 30.1*  MCV 78.5 79.1 79.4 79.2 79.1 79.2 79.8  PLT 151 128* 94* 128* 162 181 219   Cardiac Enzymes:  Recent Labs Lab 02/06/13 1951 02/07/13 0350 02/07/13 1002 02/07/13 1625  TROPONINI <0.30 <0.30 <0.30 <0.30   BNP (last 3 results)  Recent Labs  09/25/12 0936 02/06/13 1951  PROBNP 6304.0* 1450.0*   CBG: No results found for this basename: GLUCAP,  in the last 168 hours  Recent Results (from the past 240 hour(s))  CULTURE, BLOOD (ROUTINE X 2)     Status: None   Collection Time    02/06/13  8:20 PM      Result Value Range Status   Specimen Description BLOOD RIGHT ARM   Final   Special Requests BOTTLES DRAWN AEROBIC AND ANAEROBIC 10CC EA   Final   Culture  Setup Time     Final   Value: 02/07/2013 01:15     Performed at [facility]   Culture     Final   Value: ENTEROCOCCUS SPECIES     Note: COMBINATION THERAPY OF HIGH DOSE AMPICILLIN OR VANCOMYCIN, PLUS AN AMINOGLYCOSIDE, IS USUALLY INDICATED FOR SERIOUS ENTEROCOCCAL INFECTIONS.     Note: Gram Stain Report Called to,Read Back By and Verified With: STEPHANIE SHAW@1420  ON [id number] BY [facility]     Performed at [facility]   Report Status 02/09/2013 FINAL   Final   Organism ID, Bacteria ENTEROCOCCUS SPECIES   Final  CULTURE, BLOOD (ROUTINE X 2)     Status: None   Collection Time    02/06/13  8:30 PM      Result Value Range Status   Specimen Description BLOOD LEFT HAND   Final   Special Requests BOTTLES DRAWN AEROBIC ONLY 8CC   Final   Culture  Setup Time     Final   Value: 02/07/2013 01:15     Performed at [facility]   Culture     Final   Value: ENTEROCOCCUS SPECIES     Note: SUSCEPTIBILITIES PERFORMED ON PREVIOUS CULTURE WITHIN THE LAST 5 DAYS.     Note: CRITICAL RESULT CALLED TO, READ BACK BY AND VERIFIED WITH: STEPHANIE SHAW@1440  ON  [id number] BY [facility]     Performed at [facility]   Report Status 02/09/2013 FINAL   Final  URINE CULTURE     Status: None   Collection Time    02/06/13 10:16 PM      Result Value Range Status   Specimen Description URINE, CATHETERIZED   Final   Special Requests Normal   Final   Culture  Setup Time     Final   Value: 02/07/2013 04:04     Performed at [place]     Final   Value: NO GROWTH     Performed at [facility]   Culture     Final   Value: NO GROWTH     Performed at [facility]   Report Status 02/08/2013 FINAL   Final  MRSA PCR SCREENING     Status: None   Collection Time    02/07/13  2:28 AM      Result Value Range Status   MRSA by PCR NEGATIVE  NEGATIVE Final   Comment:            The GeneXpert MRSA Assay (FDA     approved for NASAL specimens     only), is one component of a     comprehensive MRSA colonization     surveillance program. It is not     intended to diagnose MRSA     infection nor to guide or     monitor treatment for     MRSA infections.  RESPIRATORY VIRUS PANEL     Status: None   Collection Time    02/07/13 12:15 PM      Result Value Range Status   Source - RVPAN NASAL SWAB   Corrected   Comment: CORRECTED ON 01/27 AT 2204: PREVIOUSLY REPORTED AS NASAL SWAB   Respiratory Syncytial Virus A NOT DETECTED  Final   Respiratory Syncytial Virus B NOT DETECTED   Final   Influenza A NOT DETECTED   Final   Influenza B NOT DETECTED   Final   Parainfluenza 1 NOT DETECTED   Final   Parainfluenza 2 NOT DETECTED   Final   Parainfluenza 3 NOT DETECTED   Final   Metapneumovirus NOT DETECTED   Final   Rhinovirus NOT DETECTED   Final   Adenovirus NOT DETECTED   Final   Influenza A H1 NOT DETECTED   Final   Influenza A H3 NOT DETECTED   Final   Comment: (NOTE)           Normal Reference Range for each Analyte: NOT DETECTED     Testing performed using the Luminex xTAG Respiratory Viral Panel test     kit.      This test was developed and its performance characteristics determined     by [facility]. It has not been cleared or approved by the [place]     Food and Drug Administration. This test is used for clinical purposes.     It should not be regarded as investigational or for research. This     laboratory is certified under the [place] (CLIA) as qualified to perform high complexity     clinical laboratory testing.     Performed at [facility], BLOOD (ROUTINE X 2)     Status: None   Collection Time    02/08/13 10:53 AM      Result Value Range Status   Specimen Description BLOOD RIGHT ARM   Final   Special Requests BOTTLES DRAWN AEROBIC AND ANAEROBIC 10CC   Final   Culture  Setup Time     Final   Value: 02/08/2013 16:31     Performed at [facility]   Culture     Final   Value:        BLOOD CULTURE RECEIVED NO GROWTH TO DATE CULTURE WILL BE HELD FOR 5 DAYS BEFORE ISSUING A FINAL NEGATIVE REPORT     Performed at [facility]   Report Status PENDING   Incomplete  CULTURE, BLOOD (ROUTINE X 2)     Status: None   Collection Time    02/08/13 10:54 AM      Result Value Range Status   Specimen Description BLOOD LEFT HAND   Final   Special Requests     Final   Value: BOTTLES DRAWN AEROBIC AND ANAEROBIC 10CC BLUE  5CC RED   Culture  Setup Time     Final   Value: 02/08/2013 16:31     Performed at [facility]   Culture     Final   Value:        BLOOD CULTURE RECEIVED NO GROWTH TO DATE CULTURE WILL BE HELD FOR 5 DAYS BEFORE ISSUING A FINAL NEGATIVE REPORT     Performed at [facility]   Report Status PENDING   Incomplete     Studies: Dg Chest Port 1 View  02/11/2013   CLINICAL DATA:  PICC line insertion  EXAM: PORTABLE CHEST - 1 VIEW  COMPARISON:  02/07/2013  FINDINGS: Cardiomediastinal silhouette is stable. Status post median sternotomy and cardiac valve replacement. Streaky right basilar  atelectasis or infiltrate. There is right arm PICC line with tip in SVC. No diagnostic pneumothorax.  IMPRESSION: Right arm PICC line with tip in SVC. No diagnostic  pneumothorax. Streaky right basilar atelectasis or infiltrate.   Electronically Signed   By: Lahoma Crocker M.D.   On: 02/11/2013 15:26    Scheduled Meds: . ampicillin (OMNIPEN) IV  2 g Intravenous Q4H  . diclofenac sodium  2 g Topical Q6H  . gentamicin  60 mg Intravenous Q8H  . levothyroxine  75 mcg Oral QAC breakfast  . pantoprazole  40 mg Oral BID  . sodium chloride  3 mL Intravenous Q12H   Continuous Infusions:   Time spent:>35 minutes  [facility] Pager [phone]. If 7PM-7AM, please contact night-coverage at [email or web address], password [facility] 02/13/2013, 1:01 PM  LOS: 7 days

## 2013-02-13 NOTE — Progress Notes (Signed)
CSW reviewed Pt's chart and noted CM has been working on setting up [facility] for pt to help with IV antibiotics.  Plan per chart is for [facility].  Please reconsult CSW if discharge needs change.  CSW signing off.  [phone] (weekend CSW)

## 2013-02-13 NOTE — Progress Notes (Signed)
[facility]  Date of Admission:  02/06/2013  Antibiotics: Antibiotics Given (last 72 hours)   Date/Time Action Medication Dose Rate   02/10/13 1553 Given   ampicillin (OMNIPEN) 2 g in sodium chloride 0.9 % 50 mL IVPB 2 g 150 mL/hr   02/10/13 1828 Given   gentamicin (GARAMYCIN) IVPB 60 mg 60 mg 100 mL/hr   02/10/13 1955 Given   ampicillin (OMNIPEN) 2 g in sodium chloride 0.9 % 50 mL IVPB 2 g 150 mL/hr   02/11/13 0023 Given   ampicillin (OMNIPEN) 2 g in sodium chloride 0.9 % 50 mL IVPB 2 g 150 mL/hr   02/11/13 0131 Given   gentamicin (GARAMYCIN) IVPB 60 mg 60 mg 100 mL/hr   02/11/13 0402 Given   ampicillin (OMNIPEN) 2 g in sodium chloride 0.9 % 50 mL IVPB 2 g 150 mL/hr   02/11/13 0843 Given   ampicillin (OMNIPEN) 2 g in sodium chloride 0.9 % 50 mL IVPB 2 g 150 mL/hr   02/11/13 1012 Given   gentamicin (GARAMYCIN) IVPB 60 mg 60 mg 100 mL/hr   02/11/13 1309 Given   ampicillin (OMNIPEN) 2 g in sodium chloride 0.9 % 50 mL IVPB 2 g 150 mL/hr   02/11/13 1659 Given   ampicillin (OMNIPEN) 2 g in sodium chloride 0.9 % 50 mL IVPB 2 g 150 mL/hr   02/11/13 1859 Given   gentamicin (GARAMYCIN) IVPB 60 mg 60 mg 100 mL/hr   02/11/13 2111 Given   ampicillin (OMNIPEN) 2 g in sodium chloride 0.9 % 50 mL IVPB 2 g 150 mL/hr   02/12/13 0055 Given   ampicillin (OMNIPEN) 2 g in sodium chloride 0.9 % 50 mL IVPB 2 g 150 mL/hr   02/12/13 0140 Given   gentamicin (GARAMYCIN) IVPB 60 mg 60 mg 100 mL/hr   02/12/13 0437 Given   ampicillin (OMNIPEN) 2 g in sodium chloride 0.9 % 50 mL IVPB 2 g 150 mL/hr   02/12/13 0903 Given   ampicillin (OMNIPEN) 2 g in sodium chloride 0.9 % 50 mL IVPB 2 g 150 mL/hr   02/12/13 0904 Given   gentamicin (GARAMYCIN) IVPB 60 mg 60 mg 100 mL/hr   02/12/13 1322 Given   ampicillin (OMNIPEN) 2 g in sodium chloride 0.9 % 50 mL IVPB 2 g 150 mL/hr   02/12/13 1600 Given   ampicillin (OMNIPEN) 2 g in sodium chloride 0.9 % 50 mL IVPB 2 g 150 mL/hr   02/12/13  1714 Given   gentamicin (GARAMYCIN) IVPB 60 mg 60 mg 100 mL/hr   02/12/13 2055 Given   ampicillin (OMNIPEN) 2 g in sodium chloride 0.9 % 50 mL IVPB 2 g 150 mL/hr   02/13/13 0004 Given   ampicillin (OMNIPEN) 2 g in sodium chloride 0.9 % 50 mL IVPB 2 g 150 mL/hr   02/13/13 0242 Given   gentamicin (GARAMYCIN) IVPB 60 mg 60 mg 100 mL/hr   02/13/13 0513 Given   ampicillin (OMNIPEN) 2 g in sodium chloride 0.9 % 50 mL IVPB 2 g 150 mL/hr   02/13/13 0801 Given   ampicillin (OMNIPEN) 2 g in sodium chloride 0.9 % 50 mL IVPB 2 g 150 mL/hr   02/13/13 0953 Given   gentamicin (GARAMYCIN) IVPB 60 mg 60 mg 100 mL/hr   02/13/13 1306 Given   ampicillin (OMNIPEN) 2 g in sodium chloride 0.9 % 50 mL IVPB 2 g 150 mL/hr      Subjective: Continued headache  Objective: Temp:  [97.7 F (  36.5 C)-100.6 F (38.1 C)] 97.7 F (36.5 C) (02/01 0900) Pulse Rate:  [91-96] 95 (02/01 0900) Resp:  [16-24] 18 (02/01 0900) BP: (82-101)/(51-65) 94/60 mmHg (02/01 0900) SpO2:  [95 %-97 %] 97 % (02/01 0900) Weight:  [163 lb 3.2 oz (74.027 kg)] 163 lb 3.2 oz (74.027 kg) (01/31 2109)  Gen: awake, alert, nad CV: RRR Lungs: CTA B Ext: no edema   Lab Results Lab Results  Component Value Date   WBC 11.3* 02/13/2013   HGB 10.1* 02/13/2013   HCT 30.1* 02/13/2013   MCV 79.8 02/13/2013   PLT 219 02/13/2013    Lab Results  Component Value Date   CREATININE 0.90 02/13/2013   BUN 7 02/13/2013   NA 133* 02/13/2013   K 3.8 02/13/2013   CL 95* 02/13/2013   CO2 24 02/13/2013    Lab Results  Component Value Date   ALT 6 02/07/2013   AST 10 02/07/2013   ALKPHOS 87 02/07/2013   BILITOT 0.9 02/07/2013      Microbiology: Recent Results (from the past 240 hour(s))  CULTURE, BLOOD (ROUTINE X 2)     Status: None   Collection Time    02/06/13  8:20 PM      Result Value Range Status   Specimen Description BLOOD RIGHT ARM   Final   Special Requests BOTTLES DRAWN AEROBIC AND ANAEROBIC 10CC EA   Final   Culture  Setup Time     Final   Value:  02/07/2013 01:15     Performed at [facility]   Culture     Final   Value: ENTEROCOCCUS SPECIES     Note: COMBINATION THERAPY OF HIGH DOSE AMPICILLIN OR VANCOMYCIN, PLUS AN AMINOGLYCOSIDE, IS USUALLY INDICATED FOR SERIOUS ENTEROCOCCAL INFECTIONS.     Note: Gram Stain Report Called to,Read Back By and Verified With: STEPHANIE SHAW_0  ON [id number] BY [facility]     Performed at [facility]   Report Status 02/09/2013 FINAL   Final   Organism ID, Bacteria ENTEROCOCCUS SPECIES   Final  CULTURE, BLOOD (ROUTINE X 2)     Status: None   Collection Time    02/06/13  8:30 PM      Result Value Range Status   Specimen Description BLOOD LEFT HAND   Final   Special Requests BOTTLES DRAWN AEROBIC ONLY 8CC   Final   Culture  Setup Time     Final   Value: 02/07/2013 01:15     Performed at [facility]   Culture     Final   Value: ENTEROCOCCUS SPECIES     Note: SUSCEPTIBILITIES PERFORMED ON PREVIOUS CULTURE WITHIN THE LAST 5 DAYS.     Note: CRITICAL RESULT CALLED TO, READ BACK BY AND VERIFIED WITH: STEPHANIE SHAW_1  ON [id number] BY [facility]     Performed at [facility]   Report Status 02/09/2013 FINAL   Final  URINE CULTURE     Status: None   Collection Time    02/06/13 10:16 PM      Result Value Range Status   Specimen Description URINE, CATHETERIZED   Final   Special Requests Normal   Final   Culture  Setup Time     Final   Value: 02/07/2013 04:04     Performed at [place]     Final   Value: NO GROWTH     Performed at [facility]   Culture     Final  Value: NO GROWTH     Performed at [facility]   Report Status 02/08/2013 FINAL   Final  MRSA PCR SCREENING     Status: None   Collection Time    02/07/13  2:28 AM      Result Value Range Status   MRSA by PCR NEGATIVE  NEGATIVE Final   Comment:            The GeneXpert MRSA Assay (FDA     approved for NASAL specimens     only), is one component of a     comprehensive  MRSA colonization     surveillance program. It is not     intended to diagnose MRSA     infection nor to guide or     monitor treatment for     MRSA infections.  RESPIRATORY VIRUS PANEL     Status: None   Collection Time    02/07/13 12:15 PM      Result Value Range Status   Source - RVPAN NASAL SWAB   Corrected   Comment: CORRECTED ON 01/27 AT 2204: PREVIOUSLY REPORTED AS NASAL SWAB   Respiratory Syncytial Virus A NOT DETECTED   Final   Respiratory Syncytial Virus B NOT DETECTED   Final   Influenza A NOT DETECTED   Final   Influenza B NOT DETECTED   Final   Parainfluenza 1 NOT DETECTED   Final   Parainfluenza 2 NOT DETECTED   Final   Parainfluenza 3 NOT DETECTED   Final   Metapneumovirus NOT DETECTED   Final   Rhinovirus NOT DETECTED   Final   Adenovirus NOT DETECTED   Final   Influenza A H1 NOT DETECTED   Final   Influenza A H3 NOT DETECTED   Final   Comment: (NOTE)           Normal Reference Range for each Analyte: NOT DETECTED     Testing performed using the Luminex xTAG Respiratory Viral Panel test     kit.     This test was developed and its performance characteristics determined     by [facility]. It has not been cleared or approved by the [place]     Food and Drug Administration. This test is used for clinical purposes.     It should not be regarded as investigational or for research. This     laboratory is certified under the [place] (CLIA) as qualified to perform high complexity     clinical laboratory testing.     Performed at [place], BLOOD (ROUTINE X 2)     Status: None   Collection Time    02/08/13 10:53 AM      Result Value Range Status   Specimen Description BLOOD RIGHT ARM   Final   Special Requests BOTTLES DRAWN AEROBIC AND ANAEROBIC 10CC   Final   Culture  Setup Time     Final   Value: 02/08/2013 16:31     Performed at [facility]   Culture     Final   Value:        BLOOD  CULTURE RECEIVED NO GROWTH TO DATE CULTURE WILL BE HELD FOR 5 DAYS BEFORE ISSUING A FINAL NEGATIVE REPORT     Performed at [facility]   Report Status PENDING   Incomplete  CULTURE, BLOOD (ROUTINE X 2)     Status: None  Collection Time    02/08/13 10:54 AM      Result Value Range Status   Specimen Description BLOOD LEFT HAND   Final   Special Requests     Final   Value: BOTTLES DRAWN AEROBIC AND ANAEROBIC 10CC BLUE  5CC RED   Culture  Setup Time     Final   Value: 02/08/2013 16:31     Performed at [facility]   Culture     Final   Value:        BLOOD CULTURE RECEIVED NO GROWTH TO DATE CULTURE WILL BE HELD FOR 5 DAYS BEFORE ISSUING A FINAL NEGATIVE REPORT     Performed at [facility]   Report Status PENDING   Incomplete    Studies/Results: Dg Chest Port 1 View  02/11/2013   CLINICAL DATA:  PICC line insertion  EXAM: PORTABLE CHEST - 1 VIEW  COMPARISON:  02/07/2013  FINDINGS: Cardiomediastinal silhouette is stable. Status post median sternotomy and cardiac valve replacement. Streaky right basilar atelectasis or infiltrate. There is right arm PICC line with tip in SVC. No diagnostic pneumothorax.  IMPRESSION: Right arm PICC line with tip in SVC. No diagnostic pneumothorax. Streaky right basilar atelectasis or infiltrate.   Electronically Signed   By: Lahoma Crocker M.D.   On: 02/11/2013 15:26    Assessment/Plan: 1) Enterococcal PVE - aortic valve.  Repeat cultures have remained negative from 1/27.  On ampicillin and gentamicin and will need for at least 6-8 weeks.  Will need cardiology follow up prior to stopping antibiotics to confirm complete resolution (stop date presumptively March 9th).  Seen by Dr. Roxy Manns who also recommends lifetime suppression, another redo would not be possible.  She is to follow up with Dr. Johnsie Cancel at discharge.   HH for weekly cbc with diff, cmp, twice weekly gent trough in the first 2 weeks to [facility] We will arrange follow up in 2 weeks in  [facility] Patient extensively counseled on severity of illness  Call with questions.   Scharlene Gloss, [place] [facility] [place] [email or web address] [phone] pager   [phone] cell 02/13/2013, 2:03 PM

## 2013-02-14 DIAGNOSIS — F1910 Other psychoactive substance abuse, uncomplicated: Secondary | ICD-10-CM

## 2013-02-14 LAB — CULTURE, BLOOD (ROUTINE X 2)
Culture: NO GROWTH
Culture: NO GROWTH

## 2013-02-14 LAB — BASIC METABOLIC PANEL
BUN: 7 mg/dL (ref 6–23)
CO2: 25 mEq/L (ref 19–32)
Calcium: 8.6 mg/dL (ref 8.4–10.5)
Chloride: 89 mEq/L — ABNORMAL LOW (ref 96–112)
Creatinine, Ser: 0.91 mg/dL (ref 0.50–1.10)
GFR, EST AFRICAN AMERICAN: 88 mL/min — AB (ref >90)
GFR, EST NON AFRICAN AMERICAN: 76 mL/min — AB (ref >90)
Glucose, Bld: 116 mg/dL — ABNORMAL HIGH (ref 70–99)
POTASSIUM: 3.9 meq/L (ref 3.7–5.3)
Sodium: 128 mEq/L — ABNORMAL LOW (ref 137–147)

## 2013-02-14 LAB — CBC WITH DIFFERENTIAL/PLATELET
BASOS ABS: 0 10*3/uL (ref 0.0–0.1)
BASOS PCT: 0 % (ref 0–1)
Eosinophils Absolute: 0.1 10*3/uL (ref 0.0–0.7)
Eosinophils Relative: 1 % (ref 0–5)
HCT: 27.5 % — ABNORMAL LOW (ref 36.0–46.0)
Hemoglobin: 9.2 g/dL — ABNORMAL LOW (ref 12.0–15.0)
Lymphocytes Relative: 12 % (ref 12–46)
Lymphs Abs: 1.1 10*3/uL (ref 0.7–4.0)
MCH: 26.7 pg (ref 26.0–34.0)
MCHC: 33.5 g/dL (ref 30.0–36.0)
MCV: 79.9 fL (ref 78.0–100.0)
Monocytes Absolute: 0.9 10*3/uL (ref 0.1–1.0)
Monocytes Relative: 10 % (ref 3–12)
NEUTROS PCT: 77 % (ref 43–77)
Neutro Abs: 7 10*3/uL (ref 1.7–7.7)
Platelets: 186 10*3/uL (ref 150–400)
RBC: 3.44 MIL/uL — AB (ref 3.87–5.11)
RDW: 17.7 % — AB (ref 11.5–15.5)
WBC: 9.1 10*3/uL (ref 4.0–10.5)

## 2013-02-14 MED ORDER — LEVOTHYROXINE SODIUM 100 MCG PO TABS: 75.0000 ug | ORAL_TABLET | Freq: Every day | ORAL | Status: DC

## 2013-02-14 MED ORDER — BUTALBITAL-APAP-CAFFEINE 50-325-40 MG PO TABS: 1.0000 | ORAL_TABLET | Freq: Four times a day (QID) | ORAL | Status: DC | PRN

## 2013-02-14 MED ORDER — HEPARIN SOD (PORK) LOCK FLUSH 100 UNIT/ML IV SOLN
250.0000 [IU] | INTRAVENOUS | Status: AC | PRN
Start: 2013-02-14 — End: 2013-02-14
  Administered 2013-02-14: 250 [IU]

## 2013-02-14 NOTE — Progress Notes (Signed)
Patient ID: [name], female   DOB: 20-Dec-1970, 43 y.o.   MRN: [id number]     SUBJECTIVE: No complaints    BP 94/58  Pulse 88  Temp(Src) 98.9 F (37.2 C) (Oral)  Resp 18  Ht 5\' 4"  (1.626 m)  Wt 163 lb 3.2 oz (74.027 kg)  BMI 28.00 kg/m2  SpO2 94%  LMP 02/05/2013  Intake/Output Summary (Last 24 hours) at 02/14/13 0816 Last data filed at 02/13/13 1700  Gross per 24 hour  Intake    720 ml  Output      0 ml  Net    720 ml    PHYSICAL EXAM General: Well developed, well nourished, in no acute distress. Alert and oriented x 3.  Psych:  Good affect, responds appropriately Neck: No JVD. No masses noted.  Lungs: Clear bilaterally with no wheezes or rhonci noted.  Heart: RRR SEM  Abdomen: Bowel sounds are present. Soft, non-tender.  Extremities: No lower extremity edema.  PIC Line RUE   LABS: Basic Metabolic Panel:  Recent Labs  69/62/9500/01/27 0500 02/14/13 0332  NA 133* 128*  K 3.8 3.9  CL 95* 89*  CO2 24 25  GLUCOSE 120* 116*  BUN 7 7  CREATININE 0.90 0.91  CALCIUM 8.6 8.6   CBC:  Recent Labs  02/13/13 0500 02/14/13 0332  WBC 11.3* 9.1  NEUTROABS  --  7.0  HGB 10.1* 9.2*  HCT 30.1* 27.5*  MCV 79.8 79.9  PLT 219 186   Cardiac Enzymes: No results found for this basename: CKTOTAL, CKMB, CKMBINDEX, TROPONINI,  in the last 72 hours Current Meds: . ampicillin (OMNIPEN) IV  2 g Intravenous Q4H  . diclofenac sodium  2 g Topical Q6H  . gentamicin  60 mg Intravenous Q8H  . levothyroxine  75 mcg Oral QAC breakfast  . pantoprazole  40 mg Oral BID  . 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 TEE 02/09/13 with immobile leaflet TVR, thickening of tips of aortic valve leaflets  concerning for endocarditis. She has been seen in our office by Dr. Eden EmmsNishan in the past.   . S/p Mitral valve replacement, Triscuspid valve replacement, aortic valve replacement: Most [facility] September [facility]. Denies IV drug use since then. UDS only positive for Thc. Presenting with fevers, abdominal pain, nausea, possibly c/w sepsis. Blood cultures are positive for enterococcus. TEE 02/10/12: the bioprosthetic mitral valve appeared to function normally with trivial MR and no vegetation. The bioprosthetic tricuspid valve did not appear to have a vegetation but one of the leaflets appeared less mobile and there was moderate TR. The mean gradient across the tricuspid valve was elevated at 8 mmHg suggesting possibility of a degree of stenosis. The bioprosthetic aortic valve showed significant thickening along the leaflet tips. This is concerning for endocarditis, especially as the valve is relatively new (from 2014). There was no significant aortic insufficiency. Normal LV size and systolic function, EF 60%. Probably normal RV size and systolic function but not seen well. She has epicardial pacemaker lead. She was seen by Dr. Cornelius Moraswen with CT surgery. No current indications for surgery. Will continue IV antibiotics for now. She would be high risk for repeat operative procedure as  she has now had two open sternotomy procedures. 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   3. Sepsis/Enterococcus bacteremia: Antibiotics per primary team/ID.   4. Hypokalemia: Replace K this am.   See note by Dr Cornelius Moras.  He wants all of her f/u for cards to be at [facility] and will not operate on her.  Should have f/u with [facility] regarding antibiotics and PIC line Also indicates life long oral antibiotic prophylaxis   Will sign off.     Charlton Haws  2/2/20158:16 AM

## 2013-02-14 NOTE — Discharge Summary (Signed)
Physician Discharge Summary  [name] ZTI:[id number] DOB: 03/01/1970 DOA: 02/06/2013  PCP: Provider Not In System  Admit date: 02/06/2013 Discharge date: 02/14/2013  Time spent: greater than 30 minutes  Recommendations for Outpatient Follow-up:  1. Repeat TEE prior to discontinuation of IV antibiotics 2. 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 female with history of bacterial endocarditis on bioprosthetic mitral and tricuspid bioprosthetic valve and had redo mitral valve aortic valve and tricuspid valve bioprosthetic replacement last October during which patient had complications with complete heart block and had pacemaker placed presents to the ER because of weakness feeling dizzy and chest pain and upper back pain. Patient had come to the ER 4 days ago with having chest pain which patient felt like shocks. At that time patient's pacemaker was interrogated. Patient states that over the last few days patient has been having chest pain like a shock going through with upper back pain and also has been having diffuse  abdominal discomfort with nausea but denies any diarrhea or vomiting. Patient has been having subjective feeling of fever chills. Denies any shortness of breath productive cough. Patient also has been having headaches. In the ER patient was initially found to be hypotensive with blood pressure in the 82N systolics. Patient's hemoglobin level has decreased by 3 g from prior 3 days ago. Stool for occult blood is positive the patient denies having seen any frank rectal bleeding. There was no obvious melena seen by ER physician on rectal exam. Patient's blood pressure improved with 1 L normal saline bolus. Blood cultures, influenza PCR and urine cultures had been sent. Patient was empirically started on vancomycin and Zosyn for possible sepsis. Patient admitted for further workup. Cardiologist on call consulted. Patient denies having any IV drug abuse since her recent valve replacement though she admits to marijuana abuse.  Hospital Course:  1. sepsis / Enterococcus Bacteremia  - TEE 1/28 showed thickening leaflets Ao valve concerning for endocarditis  - 2/2 blood cx's positive (1/25)  - repeat blood cx's obtained 1/27- neg PICC line placed 1/30  adjusted anbx's to gent and vanco  - ID consulted 2. History of bacterial endocarditis / s/p recurrent bioprosthetic valve replacements  -1st episode was MSSA, 2nd episode was strep viridans  -blood cx's here positive for enterococcus(see above)- pt denies IVDA  -per ID:  * continue ampicillin 2gm IV Q 4hr plus gentamicin, * will need 6 wks - 8 wjs of IV antibiotics as well as follow up. * We will seen her back in ID clinic in 2 and 6 wks but also have  her follow up with her cardiologists at end of course of therapy  Per CT surgery" IV atx 6-8 weeks --> repeat TEE while on IV atx, if TEE looks good, recommended lifelong oral antibiotic suppression. On the other hand, intravenous antibiotics should be continued indefinitely if blood cultures fail to clear and/or  repeat TEE shows signs of persistent infection"  4. Chest pain, non-cardiac;  -Normal coronaries per cath 2012 therefore no additional ischemic evaluation indicated  -Cont PPI (see below)  -exam c/w musc-skel pain but also epigastric pain concerning for GI etiology  4. ?? Chronic pain vs post op pain  -pt admits was dc'd on Methadone after last admit at [facility] for valve repair  -had been using sparingly due to inability to afford refill - ran out 2 weeks ago  5. Pulmonary HTN - 62 mmHg  -Noted on echo prior to recent valve replacement  6. Anemia/Heme positive stool  -Hemoglobin had decreased from 13 to 9 with previous readings in the fall of 2014 much higher in between 14 and 15  -consulted GI - rec empiric PPI OP evaluation   no evidence of large volume gross blood loss  7. Hypokalemia   repleted  8. Hypothyroidism  -TSH 25.123  Reported compliance with synthroid.  Increased sythnroid Substance abuse  -UDS pos for THC only  9. Headaches, neuro exam non focal: has pacemaker. Cancel MRI. CT with/without contrast showed no septic emboli    Procedures:  TEE  Consultations:  Cardiology  ID  Cardiothoracic surgery  GI  Discharge Exam: Filed Vitals:   02/14/13 0953  BP: 99/68  Pulse: 100  Temp: 99 F (37.2 C)  Resp: 18    General: cooperative. Flat affect Cardiovascular: RRR  Respiratory: CTA without WRR Ext no CCE  Discharge Instructions  Discharge Orders   Future Appointments Provider Department Dept Phone   02/17/2013 12:00 PM [facility] Covering Provider [place] [phone]   02/24/2013 4:15 PM Thayer Headings, MD [facility] [phone]   Future Orders Complete By Expires   Activity as tolerated - No restrictions  As directed    Diet general  As directed        Medication List         acetaminophen 500 MG tablet  Commonly known as:  TYLENOL  Take 1,000 mg by mouth every 6 (six)  hours as needed for moderate pain.     aspirin 81 MG chewable tablet  Chew 81 mg by mouth daily.     butalbital-acetaminophen-caffeine 50-325-40 MG per tablet  Commonly known as:  FIORICET, ESGIC  Take 1 tablet by mouth every 6 (six) hours as needed for headache.     gentamicin 1.2-0.9 MG/ML-%  Commonly known as:  GARAMYCIN  Inject 50 mLs (60 mg total) into the vein every 8 (eight) hours.     levothyroxine 100 MCG tablet  Commonly known as:  SYNTHROID, LEVOTHROID  Take 1 tablet (100 mcg total) by mouth daily before breakfast.     pantoprazole 40 MG tablet  Commonly known as:  PROTONIX  Take 1 tablet (40 mg total) by mouth daily.     sodium chloride 0.9 % SOLN 50 mL with ampicillin 2 G SOLR 2 g  Inject 2 g into the vein every 4 (four) hours.     traMADol 50 MG tablet  Commonly known as:  ULTRAM  Take 1 tablet (50 mg total) by mouth every 6 (six) hours  as needed for moderate pain.       No Known Allergies     Follow-up Information   Follow up with [facility]. (the office will call you for appointment)    Specialty:  Infectious Diseases   Contact information:   [place], [place] [id number] [place] [address] [phone]      Follow up with your cardiologist at [facility] In 1 month.       The results of significant diagnostics from this hospitalization (including imaging, microbiology, ancillary and laboratory) are listed below for reference.    Significant Diagnostic Studies: Dg Chest 2 View  02/06/2013   CLINICAL DATA:  Chest pain  EXAM: CHEST  2 VIEW  COMPARISON:  02/02/2013  FINDINGS: Prior median sternotomy for multiple bowel replacements. Normal heart size. No CHF. Chronic appearing right base atelectasis versus scarring. No new airspace process, collapse or consolidation. No effusion or pneumothorax. Trachea midline. Stable exam.  IMPRESSION: Stable postoperative findings.  Negative for edema.  Chronic right  base atelectasis versus scarring   Electronically Signed   By: Daryll Brod M.D.   On: 02/06/2013 21:17   Dg Chest 2 View  02/02/2013   CLINICAL DATA:  Chest pain, short of breath  EXAM: CHEST  2 VIEW  COMPARISON:  DG CHEST 1 VIEW dated 09/25/2012; DG CHEST 1V PORT dated 09/25/2012  FINDINGS: Sternotomy wires overlie normal cardiac silhouette. Prosthetic valves are noted. There is mild linear atelectasis in the right lower lobe. Improvement in the pulmonary edema pattern seen on comparison exam. A pacemaker from an inferior approach.  IMPRESSION: 1. Mild platelike atelectasis in the right lower lobe. Cannot completely exclude infiltrate although less favored. 2. Improvement in pulmonary edema pattern following triple valve replacement.   Electronically Signed   By: Suzy Bouchard M.D.   On: 02/02/2013 15:22   Ct Head W Wo Contrast  02/13/2013   CLINICAL DATA:  Endocarditis, headaches  EXAM: CT HEAD WITHOUT AND WITH CONTRAST  TECHNIQUE: Contiguous axial images were obtained from the base of the skull through the vertex without and with intravenous contrast  CONTRAST:  39m OMNIPAQUE IOHEXOL 300 MG/ML  SOLN  COMPARISON:  MR HEAD W/O CM dated 06/02/2011  FINDINGS: There is no evidence of mass effect, midline shift or extra-axial fluid collections. There is no evidence of a space-occupying lesion or intracranial hemorrhage. There is no evidence of a cortical-based area of acute infarction. There are no areas of abnormal enhancement.  The ventricles and sulci are appropriate for the patient's age. The basal cisterns are patent.  Visualized portions of the orbits are unremarkable. The visualized portions of the paranasal sinuses and mastoid air cells are unremarkable.  The osseous structures are unremarkable.  IMPRESSION: Normal CT of the brain without and with intravenous contrast.   Electronically Signed   By: HKathreen Devoid  On: 02/13/2013 16:52   Dg Chest Port 1 View  02/11/2013   CLINICAL DATA:  PICC line  insertion  EXAM: PORTABLE CHEST - 1 VIEW  COMPARISON:  02/07/2013  FINDINGS: Cardiomediastinal silhouette is stable. Status post median sternotomy and cardiac valve replacement. Streaky right basilar atelectasis or infiltrate. There is right arm PICC line with tip in SVC. No diagnostic pneumothorax.  IMPRESSION: Right arm PICC line with tip in SVC. No diagnostic pneumothorax. Streaky right basilar atelectasis or infiltrate.   Electronically Signed   By: LLahoma CrockerM.D.   On: 02/11/2013 15:26   Ct  Angio Chest Aortic Dissect W &/or W/o  02/07/2013   CLINICAL DATA:  Pain, pacemaker firing  EXAM: CT ANGIOGRAPHY CHEST, ABDOMEN AND PELVIS  TECHNIQUE: Multidetector CT imaging through the chest, abdomen and pelvis was performed using the standard protocol during bolus administration of intravenous contrast. Multiplanar reconstructed images and MIPs were obtained and reviewed to evaluate the vascular anatomy.  CONTRAST:  154m OMNIPAQUE IOHEXOL 350 MG/ML SOLN  COMPARISON:  02/06/2013 radiograph  FINDINGS: CTA CHEST FINDINGS  Normal caliber aorta. Aortic, tricuspid and mitral valve replacements. Heart size upper normal to mildly enlarged. Left anterior abdomen subcutaneous battery pack with lead tips projecting epicardial.  Respiratory motion degrades detailed evaluation. The central pulmonary arteries are patent. The more peripheral branches, particularly within the lower lobes, are obscured by motion.  Central airways are grossly patent. Linear opacity within the middle, lobe and right greater than left lower lobes. No pneumothorax.  Median sternotomy.  Review of the MIP images confirms the above findings.  CTA ABDOMEN AND PELVIS FINDINGS  Abdominal organ evaluation is limited without contrast or in the arterial phase. Within this limitation, no appreciable abnormality of the liver. Distended gallbladder. No radiodense stones. No biliary ductal dilatation. No appreciable abnormality of the pancreas. Splenic granuloma and  mild splenomegaly. Mild adreniform enlargement. Areas of right renal scarring. Mild areas of scarring on the left as well. No hydroureteronephrosis.  Limited bowel evaluation. No overt colitis. Appendix not identified. No right lower quadrant inflammation. No bowel obstruction. No free intraperitoneal air or fluid. No lymphadenopathy. Subcentimeter short axis retroperitoneal lymph nodes.  Thin walled bladder. No appreciable abnormality of the uterus and adnexa.  Scattered atherosclerosis of the aorta and branch vessels. Patent celiac axis, SMA, single renal arteries, IMA. Advanced atherosclerotic disease of the bilateral common iliac arteries. The internal and external iliac arteries remain patent as do the common femoral arteries and proximal profunda femoral and superficial femoral arteries.  Multilevel degenerative changes.  No acute osseous finding.  Review of the MIP images confirms the above findings.  IMPRESSION: Degraded by respiratory and cardiac motion. Central pulmonary arteries are patent. More peripheral branches are nondiagnostic.  Left anterior abdominal wall battery pack with epicardial leads.  Normal caliber aorta with scattered atherosclerotic disease. Aortic, mitral, tricuspid valve replacement.  Linear opacities within the right middle lobe and right greater than left lower lobes, favor atelectasis.  Areas of right greater than left renal scarring.  Mild splenomegaly at 14.5 cm.   Electronically Signed   By: ACarlos LeveringM.D.   On: 02/07/2013 02:38   Ct Angio Abd/pel W/ And/or W/o  02/07/2013   CLINICAL DATA:  Pain, pacemaker firing  EXAM: CT ANGIOGRAPHY CHEST, ABDOMEN AND PELVIS  TECHNIQUE: Multidetector CT imaging through the chest, abdomen and pelvis was performed using the standard protocol during bolus administration of intravenous contrast. Multiplanar reconstructed images and MIPs were obtained and reviewed to evaluate the vascular anatomy.  CONTRAST:  1071mOMNIPAQUE IOHEXOL 350  MG/ML SOLN  COMPARISON:  02/06/2013 radiograph  FINDINGS: CTA CHEST FINDINGS  Normal caliber aorta. Aortic, tricuspid and mitral valve replacements. Heart size upper normal to mildly enlarged. Left anterior abdomen subcutaneous battery pack with lead tips projecting epicardial.  Respiratory motion degrades detailed evaluation. The central pulmonary arteries are patent. The more peripheral branches, particularly within the lower lobes, are obscured by motion.  Central airways are grossly patent. Linear opacity within the middle, lobe and right greater than left lower lobes. No pneumothorax.  Median sternotomy.  Review of the  MIP images confirms the above findings.  CTA ABDOMEN AND PELVIS FINDINGS  Abdominal organ evaluation is limited without contrast or in the arterial phase. Within this limitation, no appreciable abnormality of the liver. Distended gallbladder. No radiodense stones. No biliary ductal dilatation. No appreciable abnormality of the pancreas. Splenic granuloma and mild splenomegaly. Mild adreniform enlargement. Areas of right renal scarring. Mild areas of scarring on the left as well. No hydroureteronephrosis.  Limited bowel evaluation. No overt colitis. Appendix not identified. No right lower quadrant inflammation. No bowel obstruction. No free intraperitoneal air or fluid. No lymphadenopathy. Subcentimeter short axis retroperitoneal lymph nodes.  Thin walled bladder. No appreciable abnormality of the uterus and adnexa.  Scattered atherosclerosis of the aorta and branch vessels. Patent celiac axis, SMA, single renal arteries, IMA. Advanced atherosclerotic disease of the bilateral common iliac arteries. The internal and external iliac arteries remain patent as do the common femoral arteries and proximal profunda femoral and superficial femoral arteries.  Multilevel degenerative changes.  No acute osseous finding.  Review of the MIP images confirms the above findings.  IMPRESSION: Degraded by  respiratory and cardiac motion. Central pulmonary arteries are patent. More peripheral branches are nondiagnostic.  Left anterior abdominal wall battery pack with epicardial leads.  Normal caliber aorta with scattered atherosclerotic disease. Aortic, mitral, tricuspid valve replacement.  Linear opacities within the right middle lobe and right greater than left lower lobes, favor atelectasis.  Areas of right greater than left renal scarring.  Mild splenomegaly at 14.5 cm.   Electronically Signed   By: Carlos Levering M.D.   On: 02/07/2013 02:38   Transthoracic Echo Left ventricle: Poor acoustic windows. Overall LVEF is normal with septal hypokinesis. The cavity size was normal. Wall thickness was normal. - Aortic valve: AV is not well seen. Peak and mean gradients are 26 and 14 mm Hg respectively consistent with mild stenosis. - Mitral valve: MV prosthesis was difficult to see well Peak and mean gradients are 14 and 6 mm Hg respectively.  Transesophageal echo Please see echo study for full report. The patient has bioprosthetic aortic, mitral, and tricuspid valves. The bioprosthetic mitral valve appeared to function normally with trivial MR and no vegetation. The bioprosthetic tricuspid valve did not appear to have a vegetation but one of the leaflets appeared less mobile and there was moderate TR. The mean gradient across the tricuspid valve was elevated at 8 mmHg suggesting possibility of a degree of stenosis. The bioprosthetic aortic valve showed significant thickening along the leaflet tips. I am concerned that this represents endocarditis, especially as the valve is relatively new (from 2014). There was no significant aortic insufficiency. Normal LV size and systolic function, EF 28%. Probably normal RV size and systolic function but not seen well. There were no pacemaker leads noted in the heart, suspect pacemaker is external.   EKG Atrial-sensed ventricular-paced rhythm No further analysis  attempted due to paced rhythm  Microbiology: Recent Results (from the past 240 hour(s))  CULTURE, BLOOD (ROUTINE X 2)     Status: None   Collection Time    02/06/13  8:20 PM      Result Value Range Status   Specimen Description BLOOD RIGHT ARM   Final   Special Requests BOTTLES DRAWN AEROBIC AND ANAEROBIC 10CC EA   Final   Culture  Setup Time     Final   Value: 02/07/2013 01:15     Performed at [facility]   Culture     Final   Value:  ENTEROCOCCUS SPECIES     Note: COMBINATION THERAPY OF HIGH DOSE AMPICILLIN OR VANCOMYCIN, PLUS AN AMINOGLYCOSIDE, IS USUALLY INDICATED FOR SERIOUS ENTEROCOCCAL INFECTIONS.     Note: Gram Stain Report Called to,Read Back By and Verified With: STEPHANIE [email or web address]_[email or web address]  ON [id number] BY [facility] [place]     Performed at [facility]   Report Status 02/09/2013 FINAL   Final   Organism ID, Bacteria ENTEROCOCCUS SPECIES   Final  CULTURE, BLOOD (ROUTINE X 2)     Status: None   Collection Time    02/06/13  8:30 PM      Result Value Range Status   Specimen Description BLOOD LEFT HAND   Final   Special Requests BOTTLES DRAWN AEROBIC ONLY 8CC   Final   Culture  Setup Time     Final   Value: 02/07/2013 01:15     Performed at [facility]   Culture     Final   Value: ENTEROCOCCUS SPECIES     Note: SUSCEPTIBILITIES PERFORMED ON PREVIOUS CULTURE WITHIN THE LAST 5 DAYS.     Note: CRITICAL RESULT CALLED TO, READ BACK BY AND VERIFIED WITH: STEPHANIE [email or web address]  ON [id number] BY [facility]     Performed at [facility]   Report Status 02/09/2013 FINAL   Final  URINE CULTURE     Status: None   Collection Time    02/06/13 10:16 PM      Result Value Range Status   Specimen Description URINE, CATHETERIZED   Final   Special Requests Normal   Final   Culture  Setup Time     Final   Value: 02/07/2013 04:04     Performed at [place] [facility]     Final   Value: NO GROWTH     Performed at [facility]   Culture     Final   Value: NO GROWTH      Performed at [facility]   Report Status 02/08/2013 FINAL   Final  MRSA PCR SCREENING     Status: None   Collection Time    02/07/13  2:28 AM      Result Value Range Status   MRSA by PCR NEGATIVE  NEGATIVE Final   Comment:            The GeneXpert MRSA Assay (FDA     approved for NASAL specimens     only), is one component of a     comprehensive MRSA colonization     surveillance program. It is not     intended to diagnose MRSA     infection nor to guide or     monitor treatment for     MRSA infections.  RESPIRATORY VIRUS PANEL     Status: None   Collection Time    02/07/13 12:15 PM      Result Value Range Status   Source - RVPAN NASAL SWAB   Corrected   Comment: CORRECTED ON 01/27 AT 2204: PREVIOUSLY REPORTED AS NASAL SWAB   Respiratory Syncytial Virus A NOT DETECTED   Final   Respiratory Syncytial Virus B NOT DETECTED   Final   Influenza A NOT DETECTED   Final   Influenza B NOT DETECTED   Final   Parainfluenza 1 NOT DETECTED   Final   Parainfluenza 2 NOT DETECTED   Final   Parainfluenza 3 NOT DETECTED   Final   Metapneumovirus NOT DETECTED   Final   Rhinovirus NOT DETECTED  Final   Adenovirus NOT DETECTED   Final   Influenza A H1 NOT DETECTED   Final   Influenza A H3 NOT DETECTED   Final   Comment: (NOTE)           Normal Reference Range for each Analyte: NOT DETECTED     Testing performed using the Luminex xTAG Respiratory Viral Panel test     kit.     This test was developed and its performance characteristics determined     by [facility]. It has not been cleared or approved by the [place]     Food and Drug Administration. This test is used for clinical purposes.     It should not be regarded as investigational or for research. This     laboratory is certified under the [place] (CLIA) as qualified to perform high complexity     clinical laboratory testing.     Performed at [facility], BLOOD (ROUTINE X 2)     Status: None   Collection Time    02/08/13 10:53 AM      Result Value Range Status   Specimen Description BLOOD RIGHT ARM   Final   Special Requests BOTTLES DRAWN AEROBIC AND ANAEROBIC 10CC   Final   Culture  Setup Time     Final   Value: 02/08/2013 16:31     Performed at [facility]   Culture     Final   Value: NO GROWTH 5 DAYS     Performed at [facility]   Report Status 02/14/2013 FINAL   Final  CULTURE, BLOOD (ROUTINE X 2)     Status: None   Collection Time    02/08/13 10:54 AM      Result Value Range Status   Specimen Description BLOOD LEFT HAND   Final   Special Requests     Final   Value: BOTTLES DRAWN AEROBIC AND ANAEROBIC 10CC BLUE  5CC RED   Culture  Setup Time     Final   Value: 02/08/2013 16:31     Performed at [facility]   Culture     Final   Value: NO GROWTH 5 DAYS     Performed at [facility]   Report Status 02/14/2013 FINAL   Final     Labs: Basic Metabolic Panel:  Recent Labs Lab 02/09/13 0419 02/10/13 0326 02/11/13 0448 02/13/13 0500 02/14/13 0332  NA 135* 139 140 133* 128*  K 3.7 3.3* 3.9 3.8 3.9  CL 104 105 103 95* 89*  CO2 18* _0 GLUCOSE 114* 120* 117* 120* 116*  BUN 3* 3* 4* 7 7  CREATININE 0.75 0.73 0.86 0.90 0.91  CALCIUM 8.4 8.7 8.7 8.6 8.6   Liver Function Tests: No results found for this basename: AST, ALT, ALKPHOS, BILITOT, PROT, ALBUMIN,  in the last 168 hours No results found for this basename: LIPASE, AMYLASE,  in the last 168 hours No results found for this basename: AMMONIA,  in the last 168 hours CBC:  Recent Labs Lab 02/09/13 0419 02/10/13 0326 02/11/13 0448 02/13/13 0500 02/14/13 0332  WBC 6.6 8.6 10.1 11.3* 9.1  NEUTROABS  --   --   --   --  7.0  HGB 9.6* 9.7* 10.1* 10.1* 9.2*  HCT 28.2* 29.1* 30.4* 30.1* 27.5*  MCV 79.2 79.1 79.2 79.8 79.9  PLT 128* 162 181 219 186  Cardiac Enzymes:  Recent Labs Lab 02/07/13 1625  TROPONINI  <0.30   BNP: BNP (last 3 results)  Recent Labs  09/25/12 0936 02/06/13 1951  PROBNP 6304.0* 1450.0*   CBG: No results found for this basename: GLUCAP,  in the last 168 hours     Signed:  [place] L  [facility] 02/14/2013, 10:38 AM

## 2013-02-14 NOTE — Progress Notes (Signed)
PICC line capped off, flushed with 10cc NS, GBR flushed with Heparin 250 units. Consuello Masse

## 2013-02-14 NOTE — Care Management Note (Signed)
   CARE MANAGEMENT NOTE 02/14/2013  Patient:  [name], [name]   Account Number:  [id number]  Date Initiated:  02/07/2013  Documentation initiated by:  Junius Creamer  Subjective/Objective Assessment:   adm w sepsis     Action/Plan:   lives alone  02/10/13 Pt transferred to 6E, notified of pt need for IV antibiotics and HH services. [facility] has been asked to work with pt re possible [facility] care for IV meds and HH.   Anticipated DC Date:  02/14/2013   Anticipated DC Plan:  HOME W HOME HEALTH SERVICES      DC Planning Services  CM consult      Choice offered to / List presented to:     DME arranged  IV PUMP/EQUIPMENT      DME agency  [facility]     [facility] arranged  HH-1 RN      [facility] agency  [facility]   Status of service:  Completed, signed off Medicare Important Message given?   (If response is "NO", the following Medicare IM given date fields will be blank) Date Medicare IM given:   Date Additional Medicare IM given:    Discharge Disposition:  HOME W HOME HEALTH SERVICES  Per UR Regulation:  Reviewed for med. necessity/level of care/duration of stay  If discussed at Long Length of Stay Meetings, dates discussed:    Comments:  02/11/2013 This CM notified by [facility] rep, Jeri Modena RN that this pt qualifies for assistance with Home IV antibiotics and HHRN to assist with adm of IV meds. Pt will be given [name] pump due to frequent administration of antibiotics. Dr York Spaniel notified and will provide prescriptions this afternoon for the antibiotics. The pt has assured the [facility] team that she will d/c to the home of [name] friend who can assist with IV antibiotics. Will provide [facility] with prescriptions as soon as available. Johny Shock RN MPH Addem: Prescriptions given to [facility] rep and MATCH letter prepared for pt to use for Protonix. Explained MATCH letter to pt and nurse, letter and prescriptions placed in shadow chart. Will notify weekend CM of plan. Johny Shock RN MPH, case  manager, [phone]  02/10/13 Notified of need for [facility] and IV antibiotics by D Dowell RNCM. This CM has asked [facility] to eval this pt for possible assistance with HH needs and IV antbiotics. Have requested prescriptions ASAP in order to calculate cost and will assist in evaluation social situation. Noted that pt lives alone, hopefully she has some assistance in the home as [facility] generally requires persons in the home to train for all patients receiving [facility] services such as IV antibiotics. Will continue to follow.    Johny Shock RN MPH, case manager, [phone]  1/28 1218p debbie dowell rn,bsn spoke w pt. she has medicaid pending and should hear something soon. if medicaid approved they will assign her new pcp. did give her the [facility] resourse list if needed.

## 2013-02-14 NOTE — Discharge Instructions (Signed)
Hand Washing The best way to get germs and dirt off your hands is to wash them. Germs can get into your body and cause an infection. If you have an infection, you can leave germs on the things you touch, and other people can get the infection when they touch those things. Washing your hands with soap and water does not kill the germs that may be on your hands, but it loosens the dirt, germs and skin oils and slides them off your skin. WHEN SHOULD I WASH MY HANDS OR USE A HAND-WASHING ALCOHOL GEL?  Whenever your hands are dirty or sticky.  After going to the bathroom.  Before you put anything (such as your hands or pills) in your mouth.  Before you touch food or start to eat.  When you come home from outside.  After you change a baby's diaper.  After you handle money.  After taking care of a sick person. WHAT IS THE RIGHT WAY TO [facility] MY HANDS?  Wet your hands under running water.  Soap your hands and wrists.  Rub your hands together. Be sure to clean around each finger, and under your nails.  Keep rubbing for at least 15 seconds. That is about the time it takes to sing "happy birthday."  Rinse under running water. Point your fingers down so the dirty water does not run up your arms.  Blot the water off your hands with a clean towel. Rubbing hard will rub your skin raw.  If you are out in a public place, a hot-air hand dryer is the best way to dry your hands. A clean paper towel is fine. MAKE SURE YOU:   Understand these instructions.  Will watch your condition.  Will get help right away if you are not doing well or get worse. Document Released: 12/13/2007 Document Revised: 03/24/2011 Document Reviewed: 12/13/2007 [facility] Patient Information 2014 [place], [place]. PICC Home Guide A peripherally inserted central catheter (PICC) is a long, thin, flexible tube that is inserted into a vein in the upper arm. It is a form of intravenous (IV) access. It is considered to be a  "central" line because the tip of the PICC ends in a large vein in your chest. This large vein is called the superior vena cava (SVC). The PICC tip ends in the SVC because there is a lot of blood flow in the SVC. This allows medicines and IV fluids to be quickly distributed throughout the body. The PICC is inserted using a sterile technique by a specially trained nurse or physician. After the PICC is inserted, a chest X-ray exam is done to be sure it is in the correct place.  A PICC may be placed for different reasons, such as:  To give medicines and liquid nutrition that can only be given through a central line. Examples are:  Certain antibiotic treatments.  Chemotherapy.  Total parenteral nutrition (TPN).  To take frequent blood samples.  To give IV fluids and blood products.  If there is difficulty placing a peripheral intravenous (PIV) catheter. If taken care of properly, a PICC can remain in place for several months. A PICC can also allow a person to go home from the hospital early. Medicine and PICC care can be managed at home by a family member or home health care team. WHAT PROBLEMS CAN HAPPEN WHEN I HAVE A PICC? Problems with a PICC can occasionally occur. These may include:  A blood clot (thrombus) forming in or at the tip of  the PICC. This can cause the PICC to become clogged. A clot-dissolving medicine called tissue plasminogen activator (tPA) can be given through the PICC to help break up the clot.  Inflammation of the vein (phlebitis) in which the PICC is placed. Signs of inflammation may include redness, pain at the insertion site, red streaks, or being able to feel a "cord" in the vein where the PICC is located.  Infection in the PICC or at the insertion site. Signs of infection may include fever, chills, redness, swelling, or pus drainage from the PICC insertion site.  PICC movement (malposition). The PICC tip may move from its original position due to excessive physical  activity, forceful coughing, sneezing, or vomiting.  A break or cut in the PICC. It is important to not use scissors near the PICC.  Nerve or tendon irritation or injury during PICC insertion. WHAT SHOULD I KEEP IN MIND ABOUT ACTIVITIES WHEN I HAVE A PICC?  You may bend your arm and move it freely. If your PICC is near or at the bend of your elbow, avoid activity with repeated motion at the elbow.  Rest at home for the remainder of the day following PICC line insertion.  Avoid lifting heavy objects as instructed by your health care provider.  Avoid using a crutch with the arm on the same side as your PICC. You may need to use a walker. WHAT SHOULD I KNOW ABOUT MY PICC DRESSING?  Keep your PICC bandage (dressing) clean and dry to prevent infection.  Ask your health care provider when you may shower. Ask your health care provider to teach you how to wrap the PICC when you do take a shower.  Change the PICC dressing as instructed by your health care provider.  Change your PICC dressing if it becomes loose or wet. WHAT SHOULD I KNOW ABOUT PICC CARE?  Check the PICC insertion site daily for leakage, redness, swelling, or pain.  Do not take a bath, swim, or use hot tubs when you have a PICC. Cover PICC line with clear plastic wrap and tape to keep it dry while showering.  Flush the PICC as directed by your health care provider. Let your health care provider know right away if the PICC is difficult to flush or does not flush. Do not use force to flush the PICC.  Do not use a syringe that is less than 10 mL to flush the PICC.  Never pull or tug on the PICC.  Avoid blood pressure checks on the arm with the PICC.  Keep your PICC identification card with you at all times.  Do not take the PICC out yourself. Only a trained clinical professional should remove the PICC. SEEK IMMEDIATE MEDICAL CARE IF:  Your PICC is accidently pulled all the way out. If this happens, cover the insertion  site with a bandage or gauze dressing. Do not throw the PICC away. Your health care provider will need to inspect it.  Your PICC was tugged or pulled and has partially come out. Do not  push the PICC back in.  There is any type of drainage, redness, or swelling where the PICC enters the skin.  You cannot flush the PICC, it is difficult to flush, or the PICC leaks around the insertion site when it is flushed.  You hear a "flushing" sound when the PICC is flushed.  You have pain, discomfort, or numbness in your arm, shoulder, or jaw on the same side as the PICC.  You  feel your heart "racing" or skipping beats.  You notice a hole or tear in the PICC.  You develop chills or a fever. MAKE SURE YOU:   Understand these instructions.  Will watch your condition.  Will get help right away if you are not doing well or get worse. Document Released: 07/06/2002 Document Revised: 10/20/2012 Document Reviewed: 09/06/2012 [facility] Patient Information 2014 [place], [place].

## 2013-02-14 NOTE — Progress Notes (Signed)
Patient discharge Home per [place] order with home health service.  Discharge instructions reviewed with patient and family.  Copies of all forms given and explained. Patient/family voiced understanding of all instructions.  Discharge in no acute distress.

## 2013-02-17 ENCOUNTER — Ambulatory Visit: Payer: Medicaid Other | Attending: Internal Medicine | Admitting: Family Medicine

## 2013-02-17 ENCOUNTER — Encounter: Payer: Self-pay | Admitting: Family Medicine

## 2013-02-17 VITALS — BP 125/69 | HR 93 | Temp 98.6°F | Resp 13 | Ht 64.0 in | Wt 157.2 lb

## 2013-02-17 DIAGNOSIS — D649 Anemia, unspecified: Secondary | ICD-10-CM | POA: Insufficient documentation

## 2013-02-17 DIAGNOSIS — I38 Endocarditis, valve unspecified: Secondary | ICD-10-CM | POA: Insufficient documentation

## 2013-02-17 DIAGNOSIS — I330 Acute and subacute infective endocarditis: Secondary | ICD-10-CM

## 2013-02-17 DIAGNOSIS — G894 Chronic pain syndrome: Secondary | ICD-10-CM | POA: Insufficient documentation

## 2013-02-17 DIAGNOSIS — Z954 Presence of other heart-valve replacement: Secondary | ICD-10-CM | POA: Insufficient documentation

## 2013-02-17 DIAGNOSIS — Z950 Presence of cardiac pacemaker: Secondary | ICD-10-CM | POA: Diagnosis not present

## 2013-02-17 NOTE — Progress Notes (Signed)
Pt is here to establish care. Pt is also here for a hospital f/u for chest pain. Recently had 2 heart surgeries. Complains of Lt upper chest pain and under the Lt breast. Pain scale of 8 today. Taking Tramadol, but does not help the pain. Chest pain spread to pt's back and Lt armpit area. Also complains of tightness in the shoulder blades around the collarbone. Has been experiencing headaches x2 weeks; medication does not help.. Pt also suffers from anxiety and signs of depression. Should speak with our Social Worker Tywan about the depression screening.

## 2013-02-17 NOTE — Progress Notes (Signed)
Subjective:    Patient ID: [name], female    DOB: 1970-04-08, 43 y.o.   MRN: [id number]  HPI Ms. Devincentis is an unfortunate 43 year old female here to establish care and to followup from her recent hospital stay.  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 Unfortunately she had complications of complete heart block and had a pacemaker placed at that time. She came to the ER feeling dizzy and having chest pain. She was found to have dropped her hemoglobin by 3 g from 3 days prior. She also was hypotensive. She was admitted and blood cultures obtained on 125 were positive for enterococcus bacteremia which was thought to cause her sepsis. A TEE on 128 showed thickening leaflets of the aortic valve concerning for endocarditis. Fortunately repeat blood cultures obtained 127 were negative. Infectious disease was consulted and she'll be following up with infectious disease is now patient. She'll be continuing ampicillin and gentamicin for 6-8 weeks. And will have a TEE before discontinuing IV antibiotics. Cardiothoracic surgery recommends lifelong oral antibiotic suppression if the TEE looks good. However if the TEE shows signs of persistent infection IV antibiotics have been recommended to be continued indefinitely.  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 When she was at [facility] for her valve replacements last fall she was started on methadone and was discharged on methadone. Her last dose of methadone was in early January of 2015. She says she hasn't been able to get methadone due to not having Medicaid anymore. She denies taking any narcotics including prescription or heroin since that time. She does complain of chronic pain of her back and feels that the Ultram she is taking is not helping enough.   Gastroenterology saw her in  the hospital regarding her hemoglobin dropped. It had dropped from 13-9 when she was admitted to GI recommended empiric PPIs. There is no evidence of large volume gross blood loss. They recommended outpatient evaluation.  The patient says that she feels well today and has no new symptoms. She does have chronic pain. She denies any dark or bloody stools. She feels like she is slowly regaining her strength. Review of Systems A 12 point review of systems is negative except as per hpi.      Objective:   Physical Exam  Nursing note and vitals reviewed. Constitutional: She is oriented to person, place, and time. She appears well-developed and well-nourished.  HENT:  Right Ear: External ear normal.  Left Ear: External ear normal.  Nose: Nose normal.  Mouth/Throat: Oropharynx is clear and moist. No oropharyngeal exudate.  Eyes: Conjunctivae are normal. Pupils are equal, round, and reactive to light.  Neck: Normal range of motion. Neck supple. No thyromegaly present.  Cardiovascular: Normal rate, regular rhythm and normal heart sounds.   Pulmonary/Chest: Effort normal and breath sounds normal.  Abdominal: Soft. Bowel sounds are normal. She exhibits no distension. There is no tenderness. There is no rebound.  Lymphadenopathy:    She has no cervical adenopathy.  Neurological: She is alert and oriented to person, place, and time. She has normal reflexes.  Skin: Skin is warm and dry.  Psychiatric: She has a normal mood and somewhat flat affect.        Assessment & Plan:  Namya was seen today for establish care.  Diagnoses and associated orders for this visit:  Chronic  pain syndrome - Ambulatory referral to Pain Clinic She understands that we do not prescribe narcotics here. Bacterial endocarditis - Ambulatory referral to Cardiology The patient denies that she has a cardiology appointment set up. And can't tell me where she plans to followup for cardiology. With her history am sure she has  seen cardiology in the past however to air on the safe side we'll place a referral to make sure that she does have followup. Anemia - [facility]up. If she has any urgent concerns before then she can call or schedule an appointment or go to the emergency department.

## 2013-02-21 ENCOUNTER — Telehealth: Payer: Self-pay | Admitting: Licensed Clinical Social Worker

## 2013-02-21 NOTE — Telephone Encounter (Signed)
Yvonna Alanis, RN with [facility] is reporting that the patient had some shortness of breath yesterday and nausea. Patient reports feeling better today. RN wanted [place] to know in case it was from her endocarditis or medications.

## 2013-02-24 ENCOUNTER — Ambulatory Visit (INDEPENDENT_AMBULATORY_CARE_PROVIDER_SITE_OTHER): Payer: Self-pay | Admitting: Internal Medicine

## 2013-02-24 ENCOUNTER — Encounter: Payer: Self-pay | Admitting: Internal Medicine

## 2013-02-24 VITALS — BP 123/76 | HR 90 | Temp 98.0°F | Ht 64.0 in | Wt 158.0 lb

## 2013-02-24 DIAGNOSIS — T827XXA Infection and inflammatory reaction due to other cardiac and vascular devices, implants and grafts, initial encounter: Secondary | ICD-10-CM

## 2013-02-24 DIAGNOSIS — I38 Endocarditis, valve unspecified: Principal | ICD-10-CM

## 2013-02-24 DIAGNOSIS — T826XXA Infection and inflammatory reaction due to cardiac valve prosthesis, initial encounter: Secondary | ICD-10-CM

## 2013-02-24 NOTE — Assessment & Plan Note (Signed)
Doing well on therapy.  She will continue on IV antibiotics until endocarditis resolved on TEE.  Then will consider chronic suppression with ampicillin.  RTC in 4 weeks.

## 2013-02-24 NOTE — Progress Notes (Signed)
   Subjective:    Patient ID: [name], female    DOB: [birth year], 43 y.o.   MRN: [id number]  HPI [name] is a 43 y.o. 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 She was then admitted on 1/26 with 5 day history of chest pain, dizziness that corresponded to hypotension, subjective fever, chills, abdominal discomfort with nausea. She was found to be febrile, hypotensive with sBP in 80s.with leukocytosis. Infectious work up revealed 2 sets of blood cultures with enterococcus. She was initially started on vancomycin and piptazo then changed to vanco, gent, rifampin when blood cx showed gpc in prs. tox screen only positive for THC. She is found to have enterococcal bacteremia. She denies any recent GI illness. Only had 1 sick contact, friend with influenza. She reports doing well otherwise .denies IV drug use.     She is here for follow up on ampicillin and gentamicin for her prosthetic valve endocarditis.  She is tolerating it fine. No diarrhea, no rashes.  Some nausea but able to eat.  No chest pain. Remains drug free.      Review of Systems  Constitutional: Positive for fatigue (deconditioning). Negative for fever and unexpected weight change.  Respiratory: Negative for cough and shortness of breath.   Cardiovascular: Negative for chest pain and leg swelling.  Gastrointestinal: Positive for nausea. Negative for vomiting, abdominal pain, diarrhea and abdominal distention.  Skin: Negative for rash.  Neurological: Negative for dizziness, light-headedness and headaches.  Hematological: Negative for adenopathy.       Objective:   Physical Exam  Constitutional: She appears well-developed and  well-nourished. No distress.  Eyes: No scleral icterus.  Cardiovascular: Normal rate, regular rhythm and normal heart sounds.   Click from PV noted  Pulmonary/Chest: Effort normal and breath sounds normal. No respiratory distress. She has no wheezes.  Lymphadenopathy:    She has no cervical adenopathy.  Skin: No rash noted.          Assessment & Plan:

## 2013-02-28 ENCOUNTER — Telehealth: Payer: Self-pay | Admitting: Internal Medicine

## 2013-02-28 NOTE — Telephone Encounter (Signed)
Pt called regarding a refill of her medications traMADol (ULTRAM) 50 MG, pantoprazole (PROTONIX) 40 MG, butalbital-acetaminophen-caffeine. Please contact pt

## 2013-03-02 ENCOUNTER — Institutional Professional Consult (permissible substitution): Payer: Self-pay | Admitting: Cardiovascular Disease

## 2013-03-02 ENCOUNTER — Telehealth: Payer: Self-pay

## 2013-03-02 NOTE — Telephone Encounter (Signed)
Patient is requesting refill on tramadol protonix and fioricet

## 2013-03-02 NOTE — Telephone Encounter (Signed)
Patient called again asking for her medication to be refilled.

## 2013-03-03 ENCOUNTER — Telehealth: Payer: Self-pay | Admitting: *Deleted

## 2013-03-03 ENCOUNTER — Telehealth: Payer: Self-pay | Admitting: Internal Medicine

## 2013-03-03 NOTE — Telephone Encounter (Signed)
Patient called requesting refills on medication Tramadol, fioricet and protonix

## 2013-03-03 NOTE — Telephone Encounter (Signed)
Thanks

## 2013-03-03 NOTE — Telephone Encounter (Signed)
Spoke w/ Yvonna Alanis, [facility] Nurse w/ [facility]. Stated pt c/o TIA symptoms and she wanted to notify our office. Please contact Nicholos Johns also pt would like refill of her medication

## 2013-03-03 NOTE — Telephone Encounter (Signed)
Spoke w/ [name], [facility] Nurse w/ [facility].  Stated pt c/o TIA symptoms and she wanted to notify our office.  Stated pt isn't scheduled to come in until March 3rd.  Kaitlyn advised pt should be seen in ER.  Informed pt has not established care w/ our office and was recently referred to [place].  Informed pt was given the soonest appt we have and should be seen in ER for symptoms described or she should contact pt's PCP for plan until pt is seen.  Verbalized understanding and agreed w/ plan.  Stated she will call pt's PCP.

## 2013-03-03 NOTE — Telephone Encounter (Signed)
Per homehealth RN, patient's hgb/hct was 8.9/27.6 on 2/16.  This was redrawn today 2/19, along with a gent trough, BUN/Creatinine.  Hgb/hct not a departure from levels recorded while in the hospital.    Yvonna Alanis also mentioned the patient had intermittent stroke-like symptoms on 2/17 that resolved on their own in about 1 hour.  The patient reported a sharp pain between her shoulder blades, a intense headache, and difficulty getting words out.  Kaitlyn educated patient about stroke symptoms, gave her instructions to call 911 if the symptoms reoccur.  RN reinforced Kaitlyn's advice to let pt's cardiologist know of this episode and advised her to go to the ED if it happens again.  Pt stated she would next time, but that the weather kept her home during that episode.   Pt would like refills of medications (tramadol, fioricet, protonix).  RN advised her to contact the PCP for these.

## 2013-03-07 ENCOUNTER — Telehealth: Payer: Self-pay | Admitting: Internal Medicine

## 2013-03-07 ENCOUNTER — Telehealth: Payer: Self-pay | Admitting: Emergency Medicine

## 2013-03-07 NOTE — Telephone Encounter (Signed)
Nurse from [facility] calling again says she has not been able to speak to nurse/Dr in over a week. Nurse says pt needs refills for pantoprazole (PROTONIX) 40 MG tablet, traMADol (ULTRAM) 50 MG tablet,  butalbital-acetaminophen-caffeine (FIORICET, ESGIC) 50-325-40 MG per tablet.  She is currently experiencing headaches, 8/10 on pain scale.  Please f/u with nurse with info.

## 2013-03-07 NOTE — Telephone Encounter (Signed)
Spoke with regarding medication refill Tramadol and Fioricet. Informed pt she needs to schedule f/u appt before we can refill appt f/u scheduled with Dr.Advani 03/11/13 @ 315 pm

## 2013-03-09 NOTE — Telephone Encounter (Signed)
Patient has an appointment scheduled with her PCP - Dr.Advani 03/11/13 @ 315 pm

## 2013-03-09 NOTE — Telephone Encounter (Signed)
Can you call the home health to see if Ms. Gan has seen her pcp for evaluation of her symptoms?

## 2013-03-11 ENCOUNTER — Ambulatory Visit: Payer: Self-pay | Admitting: Internal Medicine

## 2013-03-14 ENCOUNTER — Ambulatory Visit: Payer: Self-pay | Admitting: Internal Medicine

## 2013-03-14 NOTE — Telephone Encounter (Signed)
Addressed on 03/07/2013

## 2013-03-15 ENCOUNTER — Ambulatory Visit (INDEPENDENT_AMBULATORY_CARE_PROVIDER_SITE_OTHER): Payer: Self-pay | Admitting: Internal Medicine

## 2013-03-15 ENCOUNTER — Encounter: Payer: Self-pay | Admitting: Internal Medicine

## 2013-03-15 VITALS — BP 130/82 | HR 89 | Ht 64.0 in | Wt 162.0 lb

## 2013-03-15 DIAGNOSIS — B1920 Unspecified viral hepatitis C without hepatic coma: Secondary | ICD-10-CM

## 2013-03-15 DIAGNOSIS — R5383 Other fatigue: Secondary | ICD-10-CM

## 2013-03-15 DIAGNOSIS — Z01818 Encounter for other preprocedural examination: Secondary | ICD-10-CM

## 2013-03-15 DIAGNOSIS — I272 Pulmonary hypertension, unspecified: Secondary | ICD-10-CM

## 2013-03-15 DIAGNOSIS — I4892 Unspecified atrial flutter: Secondary | ICD-10-CM

## 2013-03-15 DIAGNOSIS — R5381 Other malaise: Secondary | ICD-10-CM

## 2013-03-15 DIAGNOSIS — T826XXA Infection and inflammatory reaction due to cardiac valve prosthesis, initial encounter: Secondary | ICD-10-CM

## 2013-03-15 DIAGNOSIS — Z952 Presence of prosthetic heart valve: Secondary | ICD-10-CM

## 2013-03-15 DIAGNOSIS — Z953 Presence of xenogenic heart valve: Secondary | ICD-10-CM

## 2013-03-15 DIAGNOSIS — T827XXA Infection and inflammatory reaction due to other cardiac and vascular devices, implants and grafts, initial encounter: Secondary | ICD-10-CM

## 2013-03-15 DIAGNOSIS — T8209XA Other mechanical complication of heart valve prosthesis, initial encounter: Secondary | ICD-10-CM

## 2013-03-15 DIAGNOSIS — I38 Endocarditis, valve unspecified: Secondary | ICD-10-CM

## 2013-03-15 DIAGNOSIS — I442 Atrioventricular block, complete: Secondary | ICD-10-CM

## 2013-03-15 DIAGNOSIS — Z950 Presence of cardiac pacemaker: Secondary | ICD-10-CM

## 2013-03-15 DIAGNOSIS — I330 Acute and subacute infective endocarditis: Secondary | ICD-10-CM

## 2013-03-15 DIAGNOSIS — I5031 Acute diastolic (congestive) heart failure: Secondary | ICD-10-CM

## 2013-03-15 DIAGNOSIS — D689 Coagulation defect, unspecified: Secondary | ICD-10-CM

## 2013-03-15 DIAGNOSIS — I2789 Other specified pulmonary heart diseases: Secondary | ICD-10-CM

## 2013-03-15 DIAGNOSIS — T8201XA Breakdown (mechanical) of heart valve prosthesis, initial encounter: Secondary | ICD-10-CM

## 2013-03-15 NOTE — Progress Notes (Signed)
OFFICE NOTE  Chief Complaint:  Hospital follow-up, prosthetic valve endocarditis  Primary Care Physician: Doris Cheadle, MD  HPI:  [name] is a 43 y/o F with history of endocarditis s/p MVR/TVR, chronic opioid abuse, polysubstance abuse including IVDA, hepatitis C, hypothyroidism, and noncompliance who presented to [facility] with concerns that her pacemaker is not working right. She has a history of TV and MV endocarditis complicated by septic emboli to the brain s/p replacement with BioCor bioprosthetic valves 06/2010 at [facility] after an initial admission on 06/11/10 at [facility] with sepsis, multi-organ failure, ARDS, VDRF, and ARF. Course was complicated by atrial flutter post-op and compliance with followup was spotty per notes. Recent [facility] notes indicate she was recently admitted 09/2012-11/12/12 with hemoptysis and SOB following injection of IV opioid analgesic pills. 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 Underwent echocardiogram on initial admission which demonstrated masses on bioprosthetic mitral valve leaflets with significantly increased MVR and TVR gradients (full details below), concerning for endocarditis in the setting of sepsis and IVDU. Blood cultures from OSH obtained on 9/13 demonstrated strep viridans (2/2). She was started on ceftriaxone, azithromycin, vancomycin, gentamycin and rifampin at the OSH. This was converted to vancomycin and subsequently ceftriaxone once OSH susceptibilities were obtained. TEE on 9/16 demonstrated large, extensive obstructive vegetations on the MV, as well as vegetations on the tricuspid valve, and large abscess extending from anterior mitral leaflet to the LA wall and posterior peri-aortic wall. She underwent re-do mitral valve replacement, tricuspid valve replacement, and aortic valve replacement (all bioprosthetic) on 09/26/2012 with Dr. Nathaniel Man. Notes also  indicate she underwent reconstruction of SVC with a bovine patch. Her post-operative course was complicated by AKI, retinitis felt due to the bacteria, AMS, lice, and CHB. She underwent temporary-permanent pacemaker placement on 9/22, and subsequently Medtronic epicardial permanent pacemaker placement on 9/26. Due to concerns over discharge with indwelling IV line for prolonged antibiotic administration in the setting of chronic IVDU, patient remained hospitalized to complete her course of ceftriaxone which completed on 10/31.  [name] was seen in the emergency department for concerns of electric shocks in her left upper abdominal quadrant. This is where her pacemaker is placed. The device was interrogated and is working appropriately. She does have external cardiac leads due to her history of endocarditis and multi-valve replacement. She was ultimately admitted with symptoms of abdominal pain and underwent cultures which indicated enterococcal bacteremia. She denies any recurrent IV drug use. She was then placed on IV antibiotics and had a TEE which did demonstrate a vegetation on the tricuspid valve. She is ultimately committed to 6-8 weeks of IV antibiotics per ID with a plan for repeat transesophageal echocardiogram at that time. She will likely need chronic oral suppressive antibiotics. She is not considered to be a redo surgical candidate at this time.  PMHx:  Past Medical History  Diagnosis Date  . Bacterial endocarditis     a. s/p MVR and TVR at [facility] 06/2010 secondary to endocarditis related to IVDA; normal cors by cath 06/2010 at [facility]. b. re-do mitral valve replacement, tricuspid valve replacement, and aortic valve replacement (all bioprosthetic).  [name] DJD (degenerative joint disease)   . Hepatitis C   . Anemia   . Hypothyroidism   . 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 Anxiety   . Mental disorder   . Chronic pain   . Noncompliance   .  Polysubstance abuse   . Sepsis     a. 05/2010  with sepsis, vegegations on valve replacements, ARDS, VDRF, multiorgan failure with elevated cardiac enzymes, suffered a left fronto-parietal subarachnoid hemorrhage, pulmonary infarcts, ARF requiring brief hemodialysis. s/p perciardial MVR/TVR.  [name] Atrial flutter     a. Post-op from valve replacement 06/2010.  [name] CHB (complete heart block)     a. After valve replacement 09/2012 - pacer placement.  . Opiate abuse, episodic   . S/P AVR, redo MVR and redo TVR with bioprosthetic valves 09/26/2012    All bioprosthetic tissue valves placed for Strep viridans prosthetic valve endocarditis by Dr. Nathaniel Man @ [facility]  . S/P placement of cardiac pacemaker 10/08/2012    Transvenous permanent pacemaker for CHB following redo TVR, redo MVR, AVR @ [facility]  . 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 Cholecystectomy    . Tonsillectomy and adenoidectomy    . Valve replacement  09/26/2012    AVR + redo MVR + redo TVR for PVE @ [facility]  . Valve replacement  06/2010    MVR + TVR for bacterial endocarditis @ [facility]  . Pacemaker placement  10/08/2012    [facility] for CHB after redo TVR  . Cataract extraction    . Multiple extractions with alveoloplasty  09/01/2011    Procedure: MULTIPLE EXTRACION WITH ALVEOLOPLASTY;  Surgeon: Georgia Lopes, DDS;  Location: [facility] OR;  Service: Oral Surgery;  Laterality: N/A;  . Tee without cardioversion N/A 02/09/2013    Procedure: TRANSESOPHAGEAL ECHOCARDIOGRAM (TEE);  Surgeon: Laurey Morale, MD;  Location: [facility] ENDOSCOPY;  Service: Cardiovascular;  Laterality: N/A;  pt awaker, responsive,..tol procedure well...MAP 76    FAMHx:  Family History  Problem Relation Age of Onset  . Cancer    . Coronary artery disease    . Cancer Maternal Uncle   . Cancer Paternal  Aunt   . Heart disease Maternal Grandfather   . Cancer Paternal Grandmother   . Cancer Paternal Grandfather     SOCHx:   reports that she quit smoking about 4 weeks ago. Her smoking use included Cigarettes. She has a 30 pack-year smoking history. She has never used smokeless tobacco. She reports that she uses illicit drugs (Marijuana and Cocaine). She reports that she does not drink alcohol.  ALLERGIES:  No Known Allergies  ROS: A comprehensive review of systems was negative except for: Constitutional: positive for anorexia, fatigue, malaise and night sweats Respiratory: positive for cough Cardiovascular: positive for exertional chest pressure/discomfort  HOME MEDS: Current Outpatient Prescriptions  Medication Sig Dispense Refill  . acetaminophen (TYLENOL) 500 MG tablet Take 1,000 mg by mouth every 6 (six) hours as needed for moderate pain.       [name] aspirin 81 MG chewable tablet Chew 81 mg by mouth daily.      . butalbital-acetaminophen-caffeine (FIORICET, ESGIC) 50-325-40 MG per tablet Take 1 tablet by mouth every 6 (six) hours as needed for headache.  14 tablet  0  . gentamicin (GARAMYCIN) 1.2-0.9 MG/ML-% Inject 60 mg into the vein daily.      [name] levothyroxine (SYNTHROID, LEVOTHROID) 100 MCG tablet Take 1 tablet (100 mcg total) by mouth daily before breakfast.  30 tablet  0  . naproxen sodium (ANAPROX) 220 MG tablet  Take 220 mg by mouth as needed.      . pantoprazole (PROTONIX) 40 MG tablet Take 1 tablet (40 mg total) by mouth daily.  20 tablet  0  . sodium chloride 0.9 % SOLN 50 mL with ampicillin 2 G SOLR 2 g Inject 2 g into the vein every 4 (four) hours.  1 Package  0  . traMADol (ULTRAM) 50 MG tablet Take 1 tablet (50 mg total) by mouth every 6 (six) hours as needed for moderate pain.  30 tablet  0   No current facility-administered medications for this visit.    LABS/IMAGING: No results found for this or any previous visit (from the past 48 hour(s)). No results  found.  VITALS: BP 130/82  Pulse 89  Ht 5\' 4"  (1.626 m)  Wt 162 lb (73.483 kg)  BMI 27.79 kg/m2  LMP 02/14/2013  EXAM: General appearance: alert, appears older than stated age and no distress Neck: no carotid bruit and no JVD Lungs: clear to auscultation bilaterally Heart: regular rate and rhythm Abdomen: soft, non-tender; bowel sounds normal; no masses,  no organomegaly and pacemaker noted in abdomen Extremities: extremities normal, atraumatic, no cyanosis or edema Pulses: 2+ and symmetric Skin: pale, warm, dry, there is a PICC line in the right upper arm Neurologic: Grossly normal Psych: Unsusual affect  EKG: Ventricular paced rhythm at 89  ASSESSMENT: 1. History of bacterial endocarditis-with reoccurrence 2. [facility] 3. Complete heart block status post external leads, abdominal pacemaker 4. History of IV drug abuse and hepatitis C 5. Pulmonary hypertension  PLAN: 1.   [name] continues on IV antibiotics. Her symptoms have improved. I agree that she will need a repeat TEE to see if that she is cleared the vegetation on her tricuspid valve. I will schedule to perform that myself toward the end of this month or beginning of April.  Chrystie NoseKenneth C. Hilty, MD, [facility] Attending Cardiologist [facility]  HILTY,Kenneth C 03/15/2013, 6:14 PM

## 2013-03-15 NOTE — Patient Instructions (Signed)
Dr. Rennis Golden would like you to have a transesophageal echocardiogram with him at the end of March/early April.   You will need to have blood work done 3-5 days prior to this outpatient procedure.

## 2013-03-22 ENCOUNTER — Telehealth: Payer: Self-pay | Admitting: *Deleted

## 2013-03-22 NOTE — Telephone Encounter (Signed)
Faxed home health certification and plan of care, plan of treatment to [facility] 

## 2013-03-24 ENCOUNTER — Ambulatory Visit: Payer: Self-pay | Admitting: Internal Medicine

## 2013-03-24 ENCOUNTER — Telehealth: Payer: Self-pay | Admitting: *Deleted

## 2013-03-24 NOTE — Telephone Encounter (Signed)
will maintain PICC w/ daily heparin flushed until appt w/ Dr. Drue Second on 03/29/13.

## 2013-03-28 ENCOUNTER — Telehealth: Payer: Self-pay | Admitting: *Deleted

## 2013-03-28 NOTE — Telephone Encounter (Signed)
Faxed signed orders to [facility].  

## 2013-03-29 ENCOUNTER — Ambulatory Visit (INDEPENDENT_AMBULATORY_CARE_PROVIDER_SITE_OTHER): Payer: Self-pay | Admitting: Internal Medicine

## 2013-03-29 ENCOUNTER — Encounter: Payer: Self-pay | Admitting: Internal Medicine

## 2013-03-29 VITALS — BP 115/72 | HR 86 | Temp 98.5°F | Wt 163.0 lb

## 2013-03-29 DIAGNOSIS — T827XXA Infection and inflammatory reaction due to other cardiac and vascular devices, implants and grafts, initial encounter: Secondary | ICD-10-CM

## 2013-03-29 DIAGNOSIS — I38 Endocarditis, valve unspecified: Principal | ICD-10-CM

## 2013-03-29 DIAGNOSIS — T826XXA Infection and inflammatory reaction due to cardiac valve prosthesis, initial encounter: Secondary | ICD-10-CM

## 2013-03-29 NOTE — Progress Notes (Signed)
Subjective:    Patient ID: Deborah NovemberNancy A Sarchet, female    DOB: 1970-04-04, 43 y.o.   MRN: [id number]  HPI Deborah Novemberancy A [name] is a 43 y/o F with history of endocarditis, chronic opioid abuse, polysubstance abuse including IVDA, hepatitis C, hypothyroidism.  She has a history of TV and MV endocarditis complicated by septic emboli to the brain s/p replacement with BioCor bioprosthetic valves [facility]. 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 TEE on 9/16 demonstrated large, extensive obstructive vegetations on the MV, as well as vegetations on the tricuspid valve, and large abscess extending from anterior mitral leaflet to the LA wall and posterior peri-aortic wall. She underwent re-do mitral valve replacement, tricuspid valve replacement, and aortic valve replacement (all bioprosthetic) plus bovine patch to SVC on 09/26/2012 with Dr. Nathaniel Manuane Davis.She underwent temporary-permanent pacemaker placement on 9/22, and subsequently Medtronic epicardial permanent pacemaker placement on 9/26. She remained hospitalized to complete her course of ceftriaxone which completed on 10/31. Her 2nd episode ws thought to be related to multiple teeth extraction in aug 2014.   Most recently,  She was admitted on 1/26 with 5 day history of chest pain, dizziness that corresponded to hypotension, subjective fever, chills, abdominal discomfort with nausea. She was found to be febrile, hypotensive with sBP in 80s.with leukocytosis. Infectious work up revealed enterococcu bacteremia.tox screen only positive for THC.. She denies any recent GI illness. Only had 1 sick contact, friend with influenza. She reports doing well otherwise .denies IV drug use.  She was discharged on ampicillin and gentamicin for her prosthetic valve endocarditis x 8wks up until she has repeat TEE to evaluate her valves. She is tolerating IV antibiotics without difficulty at this visit. fine. No  diarrhea, no rashes. Some nausea but able to eat. No chest pain. Remains drug free.   The plan is to keep her on amp and gent until her TEE which is scheduled on 4/15. She is not considered to be a candidate for a redo  Current Outpatient Prescriptions on File Prior to Visit  Medication Sig Dispense Refill  . acetaminophen (TYLENOL) 500 MG tablet Take 1,000 mg by mouth every 6 (six) hours as needed for moderate pain.       [name]. aspirin 81 MG chewable tablet Chew 81 mg by mouth daily.      . naproxen sodium (ANAPROX) 220 MG tablet Take 220 mg by mouth as needed.       No current facility-administered medications on file prior to visit.   Active Ambulatory Problems    Diagnosis Date Noted  . Hypothyroidism   . Tricuspid regurgitation 10/24/2010  . Atrial flutter 10/24/2010  . Substance abuse 10/24/2010  . CNS complication 10/24/2010  . ARDS (adult respiratory distress syndrome) 09/25/2012  . Acute diastolic CHF (congestive heart failure), NYHA class 3 09/25/2012  . Endocarditis of prosthetic valve 09/26/2012  . Bacterial endocarditis 09/26/2012  . History of bacterial endocarditis 02/07/2013  . Anemia 02/07/2013  . SIRS (systemic inflammatory response syndrome) 02/07/2013  . Dehydration 02/07/2013  . Heme positive stool 02/07/2013  . Pulmonary HTN/62 mmHg 02/07/2013  . Hypotension 02/07/2013  . Prosthetic heart valve failure- MVR/TVR 6/12, AOV 9/14 02/11/2013  . CHB (complete heart block)- PTVDP 9/14 02/11/2013  . S/P AVR, redo MVR and redo TVR with bioprosthetic valves 09/26/2012  . S/P placement of cardiac pacemaker 10/08/2012  . Hepatitis C   . Headache(784.0) 02/13/2013   Resolved Ambulatory  Problems    Diagnosis Date Noted  . Mitral regurgitation 10/24/2010  . Sepsis 02/07/2013  . Chest pain, non-cardiac 02/07/2013  . Hypokalemia 02/07/2013  . Gram-positive bacteremia 02/08/2013   Past Medical History  Diagnosis Date  . DJD (degenerative joint disease)   . Stroke  05/2010  . Anxiety   . Mental disorder   . Chronic pain   . Noncompliance   . Polysubstance abuse   . Opiate abuse, episodic   . Prosthetic valve endocarditis 09/24/2012  . Prosthetic valve endocarditis 02/06/2013  . Cerebral septic emboli 05/2010      Review of Systems No fever, chills, nightsweats, diarhea, thrush, or yeast infections    Objective:   Physical Exam BP 115/72  Pulse 86  Temp(Src) 98.5 F (36.9 C) (Oral)  Wt 163 lb (73.936 kg)  LMP 03/14/2013 gen = a xo by 3 in NAD Skin = picc line is c/d/i no erythema or drainage from site       Assessment & Plan:  Enterococcal prosthetic valve endocarditis = continue on ampicillin and gentamicin. homehealth is providing supplies, doing weekly labs for creatinine and therapeutic drug levels. Continue antibiotics until 4/17. Will transition to orals, amox, after tee

## 2013-03-31 ENCOUNTER — Ambulatory Visit: Payer: Self-pay | Admitting: Internal Medicine

## 2013-04-06 ENCOUNTER — Ambulatory Visit: Payer: Self-pay | Attending: Internal Medicine | Admitting: Internal Medicine

## 2013-04-06 ENCOUNTER — Encounter: Payer: Self-pay | Admitting: Internal Medicine

## 2013-04-06 VITALS — BP 121/77 | HR 94 | Temp 98.4°F | Resp 16 | Wt 168.6 lb

## 2013-04-06 DIAGNOSIS — B1920 Unspecified viral hepatitis C without hepatic coma: Secondary | ICD-10-CM | POA: Insufficient documentation

## 2013-04-06 DIAGNOSIS — G894 Chronic pain syndrome: Secondary | ICD-10-CM | POA: Insufficient documentation

## 2013-04-06 DIAGNOSIS — G8929 Other chronic pain: Secondary | ICD-10-CM | POA: Insufficient documentation

## 2013-04-06 DIAGNOSIS — E039 Hypothyroidism, unspecified: Secondary | ICD-10-CM | POA: Insufficient documentation

## 2013-04-06 DIAGNOSIS — Z8673 Personal history of transient ischemic attack (TIA), and cerebral infarction without residual deficits: Secondary | ICD-10-CM | POA: Insufficient documentation

## 2013-04-06 DIAGNOSIS — M545 Low back pain, unspecified: Secondary | ICD-10-CM | POA: Insufficient documentation

## 2013-04-06 DIAGNOSIS — Z760 Encounter for issue of repeat prescription: Secondary | ICD-10-CM | POA: Insufficient documentation

## 2013-04-06 DIAGNOSIS — I38 Endocarditis, valve unspecified: Secondary | ICD-10-CM | POA: Insufficient documentation

## 2013-04-06 DIAGNOSIS — Z950 Presence of cardiac pacemaker: Secondary | ICD-10-CM | POA: Insufficient documentation

## 2013-04-06 DIAGNOSIS — Z954 Presence of other heart-valve replacement: Secondary | ICD-10-CM | POA: Insufficient documentation

## 2013-04-06 DIAGNOSIS — R51 Headache: Secondary | ICD-10-CM | POA: Insufficient documentation

## 2013-04-06 DIAGNOSIS — F411 Generalized anxiety disorder: Secondary | ICD-10-CM | POA: Insufficient documentation

## 2013-04-06 DIAGNOSIS — I4892 Unspecified atrial flutter: Secondary | ICD-10-CM | POA: Insufficient documentation

## 2013-04-06 MED ORDER — LEVOTHYROXINE SODIUM 100 MCG PO TABS: 100.0000 ug | ORAL_TABLET | Freq: Every day | ORAL | Status: DC

## 2013-04-06 MED ORDER — PANTOPRAZOLE SODIUM 40 MG PO TBEC: 40.0000 mg | DELAYED_RELEASE_TABLET | Freq: Every day | ORAL | Status: DC

## 2013-04-06 MED ORDER — BUTALBITAL-APAP-CAFFEINE 50-325-40 MG PO TABS: 1.0000 | ORAL_TABLET | Freq: Four times a day (QID) | ORAL | Status: DC | PRN

## 2013-04-06 MED ORDER — TRAMADOL HCL 50 MG PO TABS: 50.0000 mg | ORAL_TABLET | Freq: Four times a day (QID) | ORAL | Status: DC | PRN

## 2013-04-06 NOTE — Progress Notes (Signed)
Patient here for follow up-chronic pain

## 2013-04-06 NOTE — Progress Notes (Signed)
Patient ID: [name], female   DOB: 1970/09/18, 43 y.o.   MRN: [id number]    MRN: [id number] Name: [name]  Sex: female Age: 42 y.o. DOB: 1970-09-30  Allergies: Review of patient's allergies indicates no known allergies.  Chief Complaint  Patient presents with  . Follow-up    HPI: Patient is 43 y.o. female who  has history of endocarditis following up with cardiology and infectious disease currently on IV antibiotics scheduled for echo next month, history of chronic headaches and chronic lower back pain patient is requesting refill on the medication, she was seen by Dr. Joseph Art last month and has been referred to pain clinic, patient also has history of anemia and has been referred to GI patient is on PPI, patient also history of her hypothyroidism, she ran out of her thyroid medication for the last 2 weeks, she denies any acute symptoms.  Past Medical History  Diagnosis Date  . Bacterial endocarditis     a. s/p MVR and TVR at [facility] 06/2010 secondary to endocarditis related to IVDA; normal cors by cath 06/2010 at [facility] b. re-do mitral valve replacement, tricuspid valve replacement, and aortic valve replacement (all bioprosthetic).  [name] DJD (degenerative joint disease)   . Hepatitis C   . Anemia   . Hypothyroidism   . 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 Anxiety   . Mental disorder   . Chronic pain   . Noncompliance   . Polysubstance abuse   . Sepsis     a. 05/2010 with sepsis, vegegations on valve replacements, ARDS, VDRF, multiorgan failure with elevated cardiac enzymes, suffered a left fronto-parietal subarachnoid hemorrhage, pulmonary infarcts, ARF requiring brief hemodialysis. s/p perciardial MVR/TVR.  [name] Atrial flutter     a. Post-op from valve replacement 06/2010.  [name] CHB (complete heart block)     a. After valve replacement 09/2012 - pacer placement.  . Opiate abuse, episodic   . S/P AVR, redo MVR and redo TVR with bioprosthetic valves 09/26/2012    All bioprosthetic  tissue valves placed for Strep viridans prosthetic valve endocarditis by Dr. Nathaniel Man @ [facility]  . S/P placement of cardiac pacemaker 10/08/2012    Transvenous permanent pacemaker for CHB following redo TVR, redo MVR, AVR @ [facility]  . 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 Cholecystectomy    . Tonsillectomy and adenoidectomy    . Valve replacement  09/26/2012    AVR + redo MVR + redo TVR for PVE @ [facility]  . Valve replacement  06/2010    MVR + TVR for bacterial endocarditis @ [facility]  . Pacemaker placement  10/08/2012    [facility] for CHB after redo TVR  . Cataract extraction    . Multiple extractions with alveoloplasty  09/01/2011    Procedure: MULTIPLE EXTRACION WITH ALVEOLOPLASTY;  Surgeon: Georgia Lopes, DDS;  Location: [facility] OR;  Service: Oral Surgery;  Laterality: N/A;  . Tee without cardioversion N/A 02/09/2013    Procedure: TRANSESOPHAGEAL ECHOCARDIOGRAM (TEE);  Surgeon: Laurey Morale, MD;  Location: [facility] ENDOSCOPY;  Service: Cardiovascular;  Laterality: N/A;  pt awaker, responsive,..tol procedure well...MAP 76      Medication List       This list is accurate as of:  04/06/13  2:30 PM.  Always use your most recent med list.               acetaminophen 500 MG tablet  Commonly known as:  TYLENOL  Take 1,000 mg by mouth every 6 (six) hours as needed for moderate pain.     aspirin 81 MG chewable tablet  Chew 81 mg by mouth daily.     butalbital-acetaminophen-caffeine 50-325-40 MG per tablet  Commonly known as:  FIORICET, ESGIC  Take 1 tablet by mouth every 6 (six) hours as needed for headache.     levothyroxine 100 MCG tablet  Commonly known as:  SYNTHROID, LEVOTHROID  Take 1 tablet (100 mcg total) by mouth daily before breakfast.     naproxen sodium  220 MG tablet  Commonly known as:  ANAPROX  Take 220 mg by mouth as needed.     pantoprazole 40 MG tablet  Commonly known as:  PROTONIX  Take 1 tablet (40 mg total) by mouth daily.     traMADol 50 MG tablet  Commonly known as:  ULTRAM  Take 1 tablet (50 mg total) by mouth every 6 (six) hours as needed for moderate pain.        Meds ordered this encounter  Medications  . butalbital-acetaminophen-caffeine (FIORICET, ESGIC) 50-325-40 MG per tablet    Sig: Take 1 tablet by mouth every 6 (six) hours as needed for headache.    Dispense:  14 tablet    Refill:  0  . levothyroxine (SYNTHROID, LEVOTHROID) 100 MCG tablet    Sig: Take 1 tablet (100 mcg total) by mouth daily before breakfast.    Dispense:  30 tablet    Refill:  3  . pantoprazole (PROTONIX) 40 MG tablet    Sig: Take 1 tablet (40 mg total) by mouth daily.    Dispense:  30 tablet    Refill:  1  . traMADol (ULTRAM) 50 MG tablet    Sig: Take 1 tablet (50 mg total) by mouth every 6 (six) hours as needed for moderate pain.    Dispense:  30 tablet    Refill:  0    Immunization History  Administered Date(s) Administered  . Influenza,inj,Quad PF,36+ Mos 02/08/2013    Family History  Problem Relation Age of Onset  . Cancer    . Coronary artery disease    . Cancer Maternal Uncle   . Cancer Paternal Aunt   . Heart disease Maternal Grandfather   . Cancer Paternal Grandmother   . Cancer Paternal Grandfather     History  Substance Use Topics  . Smoking status: Former Smoker -- 1.00 packs/day for 30 years    Types: Cigarettes    Quit date: 02/13/2013  . Smokeless tobacco: Never Used     Comment: 1 pack every 3 days - smoking vapor cigs   . Alcohol Use: No    Review of Systems   As noted in HPI  Filed Vitals:   04/06/13 1407  BP: 121/77  Pulse: 94  Temp: 98.4 F (36.9 C)  Resp: 16    Physical Exam  Physical Exam  Eyes:  Left eye surgical pupil  Neck: Neck supple.  Cardiovascular: Normal rate and  regular rhythm.   Pulmonary/Chest: She has no wheezes. She has no rales.  Musculoskeletal: She exhibits no edema.  Neurological:  DTR 1+    CBC    Component Value Date/Time   WBC 9.1 02/14/2013 0332   RBC 3.44* 02/14/2013 0332   RBC  3.84* 02/07/2013 1002   HGB 9.2* 02/14/2013 0332   HCT 27.5* 02/14/2013 0332   PLT 186 02/14/2013 0332   MCV 79.9 02/14/2013 0332   LYMPHSABS 1.1 02/14/2013 0332   MONOABS 0.9 02/14/2013 0332   EOSABS 0.1 02/14/2013 0332   BASOSABS 0.0 02/14/2013 0332    CMP     Component Value Date/Time   NA 128* 02/14/2013 0332   K 3.9 02/14/2013 0332   CL 89* 02/14/2013 0332   CO2 25 02/14/2013 0332   GLUCOSE 116* 02/14/2013 0332   BUN 7 02/14/2013 0332   CREATININE 0.91 02/14/2013 0332   CALCIUM 8.6 02/14/2013 0332   PROT 7.4 02/07/2013 0350   ALBUMIN 2.9* 02/07/2013 0350   AST 10 02/07/2013 0350   ALT 6 02/07/2013 0350   ALKPHOS 87 02/07/2013 0350   BILITOT 0.9 02/07/2013 0350   GFRNONAA 76* 02/14/2013 0332   GFRAA 88* 02/14/2013 0332    No results found for this basename: chol, tri, ldl    No components found with this basename: hga1c    Lab Results  Component Value Date/Time   AST 10 02/07/2013  3:50 AM    Assessment and Plan  Chronic pain syndrome - Plan: Patient is given refill on traMADol (ULTRAM) 50 MG tablet, already referred to pain management  Unspecified hypothyroidism - Plan: Resume back on levothyroxine (SYNTHROID, LEVOTHROID) 100 MCG tablet, repeat TSH level in 3 months  Headache(784.0) - Plan: butalbital-acetaminophen-caffeine (FIORICET, ESGIC) 50-325-40 MG per tablet  Patient follow with her infectious disease doctor as well as cardiologist.   Return in about 3 months (around 07/07/2013) for hypothyroid.  Doris CheadleADVANI, Escarlet Saathoff, MD

## 2013-04-12 ENCOUNTER — Telehealth: Payer: Self-pay | Admitting: *Deleted

## 2013-04-12 NOTE — Telephone Encounter (Signed)
Received call from [facility] RN stating that the patient was high when she arrived for her home health visit.  Per RN, patient had a lit pipe (marijuana) during the entire visit.  She stated that the patient wouldn't hold eye contact and that the environment was too dark for the RN to assess if she had pinpoint pupils.  Per RN, the PICC flushed well and she was able to draw labs without problems.  She did not see evidence of misuse of the PICC line on this visit. Andree Coss, RN

## 2013-04-13 NOTE — Telephone Encounter (Signed)
Ok, thanks.

## 2013-04-14 ENCOUNTER — Telehealth: Payer: Self-pay | Admitting: *Deleted

## 2013-04-14 NOTE — Telephone Encounter (Signed)
Kaitlyn with [facility] called to ask about the patient stop date. She advised they have today 04/14/13. After review of the chart advised her Dr Drue Second wants it extend the meds until 04/29/13 and the patient has an appt here 04/28/13 and we will make a decision then and call them. She advised she will extend based on my verbal.

## 2013-04-15 ENCOUNTER — Encounter (HOSPITAL_COMMUNITY): Payer: Self-pay | Admitting: Pharmacy Technician

## 2013-04-21 ENCOUNTER — Telehealth: Payer: Self-pay | Admitting: Internal Medicine

## 2013-04-21 ENCOUNTER — Encounter: Payer: Self-pay | Admitting: Internal Medicine

## 2013-04-21 NOTE — Telephone Encounter (Signed)
Home Health visiting today and drawing labs for PCP.  OK to draw pre-procedure labs today also.  They will send to [facility] lab with DR. Hiltys name.

## 2013-04-21 NOTE — Telephone Encounter (Signed)
[name] was supposed to get lab work today but she thought it was tomorrow. [facility] nurse in home now getting labs for another dr.  Dewayne Hatch to know if she should go ahead and get labs that we need.  Please call

## 2013-04-27 ENCOUNTER — Ambulatory Visit (HOSPITAL_COMMUNITY)
Admission: RE | Admit: 2013-04-27 | Discharge: 2013-04-27 | Disposition: A | Discharge status: Home / Self Care | Payer: Medicaid Other | Source: Ambulatory Visit | Attending: Internal Medicine | Admitting: Internal Medicine

## 2013-04-27 ENCOUNTER — Encounter (HOSPITAL_COMMUNITY)
Admission: RE | Disposition: A | Discharge status: Home / Self Care | Payer: Self-pay | Source: Ambulatory Visit | Attending: Internal Medicine

## 2013-04-27 ENCOUNTER — Encounter (HOSPITAL_COMMUNITY): Payer: Self-pay | Admitting: Internal Medicine

## 2013-04-27 DIAGNOSIS — I38 Endocarditis, valve unspecified: Secondary | ICD-10-CM | POA: Insufficient documentation

## 2013-04-27 DIAGNOSIS — I330 Acute and subacute infective endocarditis: Secondary | ICD-10-CM

## 2013-04-27 DIAGNOSIS — I071 Rheumatic tricuspid insufficiency: Secondary | ICD-10-CM

## 2013-04-27 DIAGNOSIS — Y838 Other surgical procedures as the cause of abnormal reaction of the patient, or of later complication, without mention of misadventure at the time of the procedure: Secondary | ICD-10-CM | POA: Insufficient documentation

## 2013-04-27 DIAGNOSIS — T827XXA Infection and inflammatory reaction due to other cardiac and vascular devices, implants and grafts, initial encounter: Secondary | ICD-10-CM | POA: Insufficient documentation

## 2013-04-27 DIAGNOSIS — Z01818 Encounter for other preprocedural examination: Secondary | ICD-10-CM

## 2013-04-27 DIAGNOSIS — T826XXA Infection and inflammatory reaction due to cardiac valve prosthesis, initial encounter: Secondary | ICD-10-CM

## 2013-04-27 DIAGNOSIS — Z953 Presence of xenogenic heart valve: Secondary | ICD-10-CM

## 2013-04-27 HISTORY — PX: TEE WITHOUT CARDIOVERSION: SHX5443

## 2013-04-27 SURGERY — ECHOCARDIOGRAM, TRANSESOPHAGEAL
Anesthesia: Moderate Sedation

## 2013-04-27 MED ORDER — LIDOCAINE VISCOUS 2 % MT SOLN
OROMUCOSAL | Status: DC | PRN
  Administered 2013-04-27: 15 mL via OROMUCOSAL

## 2013-04-27 MED ORDER — MIDAZOLAM HCL 5 MG/ML IJ SOLN
INTRAMUSCULAR | Status: AC
  Filled 2013-04-27: qty 2

## 2013-04-27 MED ORDER — SODIUM CHLORIDE 0.9 % IV SOLN
INTRAVENOUS | Status: DC
  Administered 2013-04-27: 1000 mL via INTRAVENOUS

## 2013-04-27 MED ORDER — FENTANYL CITRATE 0.05 MG/ML IJ SOLN
INTRAMUSCULAR | Status: AC
  Filled 2013-04-27: qty 2

## 2013-04-27 MED ORDER — SODIUM CHLORIDE 0.9 % IJ SOLN
10.0000 mL | INTRAMUSCULAR | Status: DC | PRN
  Administered 2013-04-27: 20 mL

## 2013-04-27 MED ORDER — FENTANYL CITRATE 0.05 MG/ML IJ SOLN
INTRAMUSCULAR | Status: DC | PRN
  Administered 2013-04-27 (×2): 25 ug via INTRAVENOUS

## 2013-04-27 MED ORDER — ALTEPLASE 2 MG IJ SOLR
2.0000 mg | Freq: Once | INTRAMUSCULAR | Status: AC
  Administered 2013-04-27: 2 mg
  Filled 2013-04-27: qty 2

## 2013-04-27 MED ORDER — MIDAZOLAM HCL 10 MG/2ML IJ SOLN
INTRAMUSCULAR | Status: DC | PRN
  Administered 2013-04-27: 1 mg via INTRAVENOUS
  Administered 2013-04-27: 2 mg via INTRAVENOUS
  Administered 2013-04-27 (×3): 1 mg via INTRAVENOUS

## 2013-04-27 MED ORDER — LIDOCAINE VISCOUS 2 % MT SOLN
OROMUCOSAL | Status: AC
  Filled 2013-04-27: qty 15

## 2013-04-27 MED ORDER — HEPARIN SOD (PORK) LOCK FLUSH 100 UNIT/ML IV SOLN
250.0000 [IU] | INTRAVENOUS | Status: AC | PRN
  Administered 2013-04-27: 250 [IU]

## 2013-04-27 NOTE — Progress Notes (Signed)
Was notified that the patient's PICC line was thrombosed. IV team was contacted and they asked to administer tPA.  This was given and the patient was monitored for response.  Deborah Nose, MD, [facility] Attending Cardiologist [facility]

## 2013-04-27 NOTE — Discharge Instructions (Signed)
Transesophageal Echocardiography °Transesophageal echocardiography (TEE) is a special type of test that produces images of the heart by using sound waves (echocardiogram). This type of echocardiography can obtain better images of the heart than standard echocardiography. TEE is done by passing a flexible tube down the esophagus. The heart is located in front of the esophagus. Because the heart and esophagus are close to one another, your health care provider can take very clear, detailed pictures of the heart via ultrasound waves. °TEE may be done: °· If your health care provider needs more information based on standard echocardiography findings. °· If you had a stroke. This might have happened because a clot formed in your heart. TEE can visualize different areas of the heart and check for clots. °· To check valve anatomy and function. °· To check for infection on the inside of your heart (endocarditis). °· To evaluate the dividing wall (septum) of the heart and presence of a hole that did not close after birth (patent foramen ovale or atrial septal defect). °· To help diagnose a tear in the wall of the aorta (aortic dissection). °· During cardiac valve surgery. This allows the surgeon to assess the valve repair before closing the chest. °· During a variety of other cardiac procedures to guide positioning of catheters. °· Sometimes before a cardioversion, which is a shock to convert heart rhythm back to normal. °LET YOUR HEALTH CARE PROVIDER KNOW ABOUT:  °· Any allergies you have. °· All medicines you are taking, including vitamins, herbs, eye drops, creams, and over-the-counter medicines. °· Previous problems you or members of your family have had with the use of anesthetics. °· Any blood disorders you have. °· Previous surgeries you have had. °· Medical conditions you have. °· Swallowing difficulties. °· An esophageal obstruction. °RISKS AND COMPLICATIONS  °Generally, TEE is a safe procedure. However, as with any  procedure, complications can occur. Possible complications include an esophageal tear (rupture). °BEFORE THE PROCEDURE  °· Do not eat or drink for 6 hours before the procedure or as directed by your health care provider. °· Arrange for someone to drive you home after the procedure. Do not drive yourself home. During the procedure, you will be given medicines that can continue to make you feel drowsy and can impair your reflexes. °· An IV access tube will be started in the arm. °PROCEDURE  °· A medicine to help you relax (sedative) will be given through the IV access tube. °· A medicine that numbs the area (local anesthetic) may be sprayed in the back of the throat. °· Your blood pressure, heart rate, and breathing (vital signs) will be monitored during the procedure. °· The TEE probe is a long, flexible tube. The tip of the probe is placed into the back of the mouth, and you will be asked to swallow. This helps to pass the tip of the probe into the esophagus. Once the tip of the probe is in the correct area, your health care provider can take pictures of the heart. °· TEE is usually not a painful procedure. You may feel the probe press against the back of the throat. The probe does not enter the trachea and does not affect your breathing. °· Your time spent at the hospital is usually less than 2 hours. °AFTER THE PROCEDURE  °· You will be in bed, resting, until you have fully returned to consciousness. °· When you first awaken, your throat may feel slightly sore and will probably still feel numb. This will   improve slowly over time.  You will not be allowed to eat or drink until it is clear that the numbness has improved.  Once you have been able to drink, urinate, and sit on the edge of the bed without feeling sick to your stomach (nausea) or dizzy, you may be cleared to go home.  You should have a friend or family member with you for the next 24 hours after your procedure. Document Released: 03/22/2002  Document Revised: 10/20/2012 Document Reviewed: 07/01/2012 [facility] Patient Information 2014 [place], [place]. Transesophageal Echocardiography Transesophageal echocardiography (TEE) is a special type of test that produces images of the heart by using sound waves (echocardiogram). This type of echocardiography can obtain better images of the heart than standard echocardiography. TEE is done by passing a flexible tube down the esophagus. The heart is located in front of the esophagus. Because the heart and esophagus are close to one another, your health care provider can take very clear, detailed pictures of the heart via ultrasound waves. TEE may be done:  If your health care provider needs more information based on standard echocardiography findings.  If you had a stroke. This might have happened because a clot formed in your heart. TEE can visualize different areas of the heart and check for clots.  To check valve anatomy and function.  To check for infection on the inside of your heart (endocarditis).  To evaluate the dividing wall (septum) of the heart and presence of a hole that did not close after birth (patent foramen ovale or atrial septal defect).  To help diagnose a tear in the wall of the aorta (aortic dissection).  During cardiac valve surgery. This allows the surgeon to assess the valve repair before closing the chest.  During a variety of other cardiac procedures to guide positioning of catheters.  Sometimes before a cardioversion, which is a shock to convert heart rhythm back to normal. LET [facility]OUR HEALTH CARE PROVIDER KNOW ABOUT:   Any allergies you have.  All medicines you are taking, including vitamins, herbs, eye drops, creams, and over-the-counter medicines.  Previous problems you or members of your family have had with the use of anesthetics.  Any blood disorders you have.  Previous surgeries you have had.  Medical conditions you have.  Swallowing  difficulties.  An esophageal obstruction. RISKS AND COMPLICATIONS  Generally, TEE is a safe procedure. However, as with any procedure, complications can occur. Possible complications include an esophageal tear (rupture). BEFORE THE PROCEDURE   Do not eat or drink for 6 hours before the procedure or as directed by your health care provider.  Arrange for someone to drive you home after the procedure. Do not drive yourself home. During the procedure, you will be given medicines that can continue to make you feel drowsy and can impair your reflexes.  An IV access tube will be started in the arm. PROCEDURE   A medicine to help you relax (sedative) will be given through the IV access tube.  A medicine that numbs the area (local anesthetic) may be sprayed in the back of the throat.  Your blood pressure, heart rate, and breathing (vital signs) will be monitored during the procedure.  The TEE probe is a long, flexible tube. The tip of the probe is placed into the back of the mouth, and you will be asked to swallow. This helps to pass the tip of the probe into the esophagus. Once the tip of the probe is in the correct area, your health  care provider can take pictures of the heart.  TEE is usually not a painful procedure. You may feel the probe press against the back of the throat. The probe does not enter the trachea and does not affect your breathing.  Your time spent at the hospital is usually less than 2 hours. AFTER THE PROCEDURE   You will be in bed, resting, until you have fully returned to consciousness.  When you first awaken, your throat may feel slightly sore and will probably still feel numb. This will improve slowly over time.  You will not be allowed to eat or drink until it is clear that the numbness has improved.  Once you have been able to drink, urinate, and sit on the edge of the bed without feeling sick to your stomach (nausea) or dizzy, you may be cleared to go home.  You  should have a friend or family member with you for the next 24 hours after your procedure. Document Released: 03/22/2002 Document Revised: 10/20/2012 Document Reviewed: 07/01/2012 [facility] Patient Information 2014 [place], [place].

## 2013-04-27 NOTE — CV Procedure (Signed)
TRANSESOPHAGEAL ECHOCARDIOGRAM (TEE) NOTE  INDICATIONS: bioprosthetic valve infective endocarditis  PROCEDURE:   Informed consent was obtained prior to the procedure. The risks, benefits and alternatives for the procedure were discussed and the patient comprehended these risks.  Risks include, but are not limited to, cough, sore throat, vomiting, nausea, somnolence, esophageal and stomach trauma or perforation, bleeding, low blood pressure, aspiration, pneumonia, infection, trauma to the teeth and death.    After a procedural time-out, the patient was given 6 mg versed and 50 mcg fentanyl for moderate sedation.  The oropharynx was anesthetized 10 cc of topical 1% viscous lidocaine and 1 cetacaine spray.  The transesophageal probe was inserted in the esophagus and stomach without difficulty and multiple views were obtained.  The patient was kept under observation until the patient left the procedure room.  The patient left the procedure room in stable condition.   Agitated microbubble saline contrast was not administered.  COMPLICATIONS:    There were no immediate complications.  Findings:  1. LEFT VENTRICLE: The left ventricular wall thickness is normal.  The left ventricular cavity is normal in size. Wall motion is normal.  LVEF is 55%.  2. RIGHT VENTRICLE:  The right ventricle is normal in structure and function without any thrombus or masses.    3. LEFT ATRIUM:  The left atrium is normal in size without any thrombus or masses.  There is not spontaneous echo contrast ("smoke") in the left atrium consistent with a low flow state.  4. LEFT ATRIAL APPENDAGE:  The left atrial appendage is free of any thrombus or masses. The appendage has single lobes. Pulse doppler indicates moderate flow in the appendage.  5. ATRIAL SEPTUM:  The atrial septum appears intact and is free of thrombus and/or masses.  There is no evidence for interatrial shunting by color doppler and saline  microbubble.  6. RIGHT ATRIUM:  The right atrium is normal in size and function without any thrombus or masses.  7. MITRAL VALVE:  The bioprosthetic mitral valve is normal in structure and function with trivial regurgitation.  There were no vegetations or stenosis.  8. AORTIC VALVE:  The bioprosthetic aortic valve is small (annulus measures 1.4-1.6 cm) in size and fairly well visualized in 2D and 3D views.  The leaflets appears less thickened with normal mobility, compared to the study in 01/2013.  There is trivial regurgitation.  There were no vegetations or stenosis  9. TRICUSPID VALVE:  The bioprosthetic tricuspid valve demonstrates a hypomobile lateral leaflet with Moderate regurgitation. Peak TR gradient was 30 mmHg. There were no vegetations, nor signs of significant stenosis  10.  PULMONIC VALVE:  The pulmonic valve is normal in structure and function with no regurgitation.  There were no vegetations or stenosis.   11. AORTIC ARCH, ASCENDING AND DESCENDING AORTA:  The aortic arch was not well visualized.  IMPRESSION:   1. Resolution of bioprosthetic aortic valve leaflet thickening, which was suspicious for endocarditis. 2. 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 Peak TR gradient was 30 mmHg. 3. Normal appearing bioprosthetic mitral valve. 4. Normal native pulmonic valve. 5. LVEF 55% with normal wall motion. 6. No LAA thrombus.  RECOMMENDATIONS:    1. Would recommend discontinuing antibiotics per ID recommendations once she has completed her course and repeat blood cultures to ensure she has cleared her bacteremia.  2. She will need endocarditis prophylaxis lifelong for dental procedures, surgeries, etc. 3. Follow-up with me can be in 6 months.  Time Spent  Directly with the Patient:  60 minutes   Chrystie Nose, MD, [facility] Attending Cardiologist [facility]  04/27/2013, 9:48 AM

## 2013-04-27 NOTE — Progress Notes (Signed)
*  PRELIMINARY RESULTS* Echocardiogram Echocardiogram Transesophageal has been performed.  [name] 04/27/2013, 10:08 AM

## 2013-04-27 NOTE — Progress Notes (Signed)
Called by home health nurse after patient discharged. They apparently drew labs we had requested yesterday and there was a critical potassium of 7.5.  This was apparently off of the PICC line, which was clotted. Her EKG did not show acute hyperkalemic T wave changes. I ordered a STAT BMP - home health said they would draw it today, but not off of the line. She may need to return to the ER if her potassium is truly elevated.  Chrystie Nose, MD, [facility] Attending Cardiologist [facility]

## 2013-04-27 NOTE — Progress Notes (Signed)
Patient sent home after PICC line de clotted by IV team.

## 2013-04-27 NOTE — H&P (Signed)
     INTERVAL PROCEDURE H&P  History and Physical Interval Note:  04/27/2013 8:28 AM  [name] has presented today for their planned procedure. The various methods of treatment have been discussed with the patient and family. After consideration of risks, benefits and other options for treatment, the patient has consented to the procedure.  The patients' outpatient history has been reviewed, patient examined, and no change in status from most recent office note within the past 30 days. I have reviewed the patients' chart and labs and will proceed as planned. Questions were answered to the patient's satisfaction.   Chrystie Nose, MD, [facility] Attending Cardiologist [facility]  Chrystie Nose 04/27/2013, 8:28 AM

## 2013-04-28 ENCOUNTER — Encounter (HOSPITAL_COMMUNITY): Payer: Self-pay | Admitting: Internal Medicine

## 2013-04-28 ENCOUNTER — Other Ambulatory Visit: Payer: Self-pay | Admitting: *Deleted

## 2013-04-28 ENCOUNTER — Telehealth: Payer: Self-pay | Admitting: *Deleted

## 2013-04-28 ENCOUNTER — Ambulatory Visit: Payer: Self-pay | Admitting: Internal Medicine

## 2013-04-28 NOTE — Telephone Encounter (Signed)
Patient called to cancel today's appt with Dr. Drue Second and wanted to know about continuing IV antibiotics, her last dose was yesterday. Per Dr. Drue Second verbal order given to [facility] to pull picc line. Patient notified that Dr. Drue Second will be sending oral antibiotic to her pharmacy. Patient uses [facility] at [facility]. Wendall Mola

## 2013-05-03 ENCOUNTER — Other Ambulatory Visit: Payer: Self-pay | Admitting: Licensed Clinical Social Worker

## 2013-05-03 ENCOUNTER — Telehealth: Payer: Self-pay | Admitting: *Deleted

## 2013-05-03 MED ORDER — AMOXICILLIN 500 MG PO CAPS: 500.0000 mg | ORAL_CAPSULE | Freq: Three times a day (TID) | ORAL | Status: DC

## 2013-05-03 NOTE — Telephone Encounter (Signed)
Patient called stating that her picc line has not been pulled by [facility]. Verbal order per Dr. Drue Second was given on 04/28/13. Patient's Home Health Nurse, Florentina Addison, called and spoke to [facility], RN and another verbal order provided to pull picc today 05/03/13. Called and reported to [facility], pharmacist at [facility] that patient reports her picc line dressing has also not been changed for two weeks. Mary to notify nursing manager of this. Picc line to be pulled today. And Dr. Drue Second to send oral antibiotic to patient's pharmacy [facility]. Patient is uninsured and is requesting a generic Rx. Wendall Mola

## 2013-05-03 NOTE — Telephone Encounter (Signed)
[name], manager with [facility] called back and dressing changes have been done on patient's picc line weekly. Her picc will be pulled today.

## 2013-06-16 ENCOUNTER — Telehealth: Payer: Self-pay | Admitting: Internal Medicine

## 2013-06-16 NOTE — Telephone Encounter (Signed)
[name] at [facility] looking for records form for relaese to be sent

## 2013-06-16 NOTE — Telephone Encounter (Signed)
Please call,need your help she is trying to help this pt get Medicaid.

## 2013-07-07 ENCOUNTER — Ambulatory Visit: Payer: Self-pay | Admitting: Internal Medicine

## 2013-07-18 ENCOUNTER — Encounter (HOSPITAL_COMMUNITY): Payer: Self-pay | Admitting: Emergency Medicine

## 2013-07-18 DIAGNOSIS — I712 Thoracic aortic aneurysm, without rupture, unspecified: Secondary | ICD-10-CM | POA: Diagnosis present

## 2013-07-18 DIAGNOSIS — Z91199 Patient's noncompliance with other medical treatment and regimen due to unspecified reason: Secondary | ICD-10-CM

## 2013-07-18 DIAGNOSIS — Z8673 Personal history of transient ischemic attack (TIA), and cerebral infarction without residual deficits: Secondary | ICD-10-CM

## 2013-07-18 DIAGNOSIS — F411 Generalized anxiety disorder: Secondary | ICD-10-CM | POA: Diagnosis present

## 2013-07-18 DIAGNOSIS — F17200 Nicotine dependence, unspecified, uncomplicated: Secondary | ICD-10-CM | POA: Diagnosis present

## 2013-07-18 DIAGNOSIS — F1210 Cannabis abuse, uncomplicated: Secondary | ICD-10-CM | POA: Diagnosis present

## 2013-07-18 DIAGNOSIS — Z952 Presence of prosthetic heart valve: Secondary | ICD-10-CM

## 2013-07-18 DIAGNOSIS — D6489 Other specified anemias: Secondary | ICD-10-CM | POA: Diagnosis present

## 2013-07-18 DIAGNOSIS — Z8249 Family history of ischemic heart disease and other diseases of the circulatory system: Secondary | ICD-10-CM

## 2013-07-18 DIAGNOSIS — I5031 Acute diastolic (congestive) heart failure: Principal | ICD-10-CM | POA: Diagnosis present

## 2013-07-18 DIAGNOSIS — N179 Acute kidney failure, unspecified: Secondary | ICD-10-CM | POA: Diagnosis not present

## 2013-07-18 DIAGNOSIS — Z792 Long term (current) use of antibiotics: Secondary | ICD-10-CM

## 2013-07-18 DIAGNOSIS — Z7982 Long term (current) use of aspirin: Secondary | ICD-10-CM

## 2013-07-18 DIAGNOSIS — B1920 Unspecified viral hepatitis C without hepatic coma: Secondary | ICD-10-CM | POA: Diagnosis present

## 2013-07-18 DIAGNOSIS — F1410 Cocaine abuse, uncomplicated: Secondary | ICD-10-CM | POA: Diagnosis present

## 2013-07-18 DIAGNOSIS — I079 Rheumatic tricuspid valve disease, unspecified: Secondary | ICD-10-CM | POA: Diagnosis present

## 2013-07-18 DIAGNOSIS — G8929 Other chronic pain: Secondary | ICD-10-CM | POA: Diagnosis present

## 2013-07-18 DIAGNOSIS — Z950 Presence of cardiac pacemaker: Secondary | ICD-10-CM

## 2013-07-18 DIAGNOSIS — I428 Other cardiomyopathies: Secondary | ICD-10-CM | POA: Diagnosis present

## 2013-07-18 DIAGNOSIS — D649 Anemia, unspecified: Secondary | ICD-10-CM | POA: Diagnosis present

## 2013-07-18 DIAGNOSIS — M1990 Unspecified osteoarthritis, unspecified site: Secondary | ICD-10-CM | POA: Diagnosis present

## 2013-07-18 DIAGNOSIS — Z66 Do not resuscitate: Secondary | ICD-10-CM | POA: Diagnosis not present

## 2013-07-18 DIAGNOSIS — Z9119 Patient's noncompliance with other medical treatment and regimen: Secondary | ICD-10-CM

## 2013-07-18 DIAGNOSIS — E039 Hypothyroidism, unspecified: Secondary | ICD-10-CM | POA: Diagnosis present

## 2013-07-18 NOTE — ED Notes (Signed)
Presents with 4 days of SOB, unable to ambulate or exert self without SOB and back pain in between shoulder blades associated with nausea, denies peripheral edema. Left breath sounds clear. Right diminished. Pt able to speak in full sentences. SOB is better with rest.  Ventricular pacemaker.  HX of multiple Cardiac surgeries.

## 2013-07-19 ENCOUNTER — Emergency Department (HOSPITAL_COMMUNITY): Payer: Medicaid Other

## 2013-07-19 ENCOUNTER — Encounter (HOSPITAL_COMMUNITY): Payer: Self-pay | Admitting: Radiology

## 2013-07-19 ENCOUNTER — Inpatient Hospital Stay (HOSPITAL_COMMUNITY)
Admission: EM | Admit: 2013-07-19 | Discharge: 2013-07-26 | DRG: 287 | Disposition: A | Discharge status: Hospice | Payer: Medicaid Other | Attending: Internal Medicine | Admitting: Internal Medicine

## 2013-07-19 DIAGNOSIS — F1410 Cocaine abuse, uncomplicated: Secondary | ICD-10-CM | POA: Diagnosis present

## 2013-07-19 DIAGNOSIS — F1210 Cannabis abuse, uncomplicated: Secondary | ICD-10-CM | POA: Diagnosis present

## 2013-07-19 DIAGNOSIS — E860 Dehydration: Secondary | ICD-10-CM

## 2013-07-19 DIAGNOSIS — G969 Disorder of central nervous system, unspecified: Secondary | ICD-10-CM

## 2013-07-19 DIAGNOSIS — I5031 Acute diastolic (congestive) heart failure: Secondary | ICD-10-CM | POA: Diagnosis present

## 2013-07-19 DIAGNOSIS — N179 Acute kidney failure, unspecified: Secondary | ICD-10-CM | POA: Diagnosis not present

## 2013-07-19 DIAGNOSIS — R778 Other specified abnormalities of plasma proteins: Secondary | ICD-10-CM

## 2013-07-19 DIAGNOSIS — I712 Thoracic aortic aneurysm, without rupture, unspecified: Secondary | ICD-10-CM | POA: Diagnosis present

## 2013-07-19 DIAGNOSIS — Z952 Presence of prosthetic heart valve: Secondary | ICD-10-CM

## 2013-07-19 DIAGNOSIS — Z8679 Personal history of other diseases of the circulatory system: Secondary | ICD-10-CM

## 2013-07-19 DIAGNOSIS — Z91199 Patient's noncompliance with other medical treatment and regimen due to unspecified reason: Secondary | ICD-10-CM | POA: Diagnosis not present

## 2013-07-19 DIAGNOSIS — F411 Generalized anxiety disorder: Secondary | ICD-10-CM | POA: Diagnosis present

## 2013-07-19 DIAGNOSIS — R195 Other fecal abnormalities: Secondary | ICD-10-CM

## 2013-07-19 DIAGNOSIS — G8929 Other chronic pain: Secondary | ICD-10-CM | POA: Diagnosis present

## 2013-07-19 DIAGNOSIS — F17200 Nicotine dependence, unspecified, uncomplicated: Secondary | ICD-10-CM | POA: Diagnosis present

## 2013-07-19 DIAGNOSIS — T8201XA Breakdown (mechanical) of heart valve prosthesis, initial encounter: Secondary | ICD-10-CM

## 2013-07-19 DIAGNOSIS — F1910 Other psychoactive substance abuse, uncomplicated: Secondary | ICD-10-CM | POA: Diagnosis present

## 2013-07-19 DIAGNOSIS — Z8249 Family history of ischemic heart disease and other diseases of the circulatory system: Secondary | ICD-10-CM | POA: Diagnosis not present

## 2013-07-19 DIAGNOSIS — I428 Other cardiomyopathies: Secondary | ICD-10-CM | POA: Diagnosis present

## 2013-07-19 DIAGNOSIS — Z8673 Personal history of transient ischemic attack (TIA), and cerebral infarction without residual deficits: Secondary | ICD-10-CM | POA: Diagnosis not present

## 2013-07-19 DIAGNOSIS — Z66 Do not resuscitate: Secondary | ICD-10-CM | POA: Diagnosis not present

## 2013-07-19 DIAGNOSIS — I442 Atrioventricular block, complete: Secondary | ICD-10-CM

## 2013-07-19 DIAGNOSIS — I38 Endocarditis, valve unspecified: Secondary | ICD-10-CM

## 2013-07-19 DIAGNOSIS — I5021 Acute systolic (congestive) heart failure: Secondary | ICD-10-CM

## 2013-07-19 DIAGNOSIS — J80 Acute respiratory distress syndrome: Secondary | ICD-10-CM

## 2013-07-19 DIAGNOSIS — Z515 Encounter for palliative care: Secondary | ICD-10-CM

## 2013-07-19 DIAGNOSIS — M1990 Unspecified osteoarthritis, unspecified site: Secondary | ICD-10-CM | POA: Diagnosis present

## 2013-07-19 DIAGNOSIS — I071 Rheumatic tricuspid insufficiency: Secondary | ICD-10-CM

## 2013-07-19 DIAGNOSIS — E039 Hypothyroidism, unspecified: Secondary | ICD-10-CM | POA: Diagnosis present

## 2013-07-19 DIAGNOSIS — R51 Headache: Secondary | ICD-10-CM

## 2013-07-19 DIAGNOSIS — R0602 Shortness of breath: Secondary | ICD-10-CM | POA: Diagnosis present

## 2013-07-19 DIAGNOSIS — D6489 Other specified anemias: Secondary | ICD-10-CM

## 2013-07-19 DIAGNOSIS — Z7982 Long term (current) use of aspirin: Secondary | ICD-10-CM | POA: Diagnosis not present

## 2013-07-19 DIAGNOSIS — Z950 Presence of cardiac pacemaker: Secondary | ICD-10-CM

## 2013-07-19 DIAGNOSIS — Z792 Long term (current) use of antibiotics: Secondary | ICD-10-CM | POA: Diagnosis not present

## 2013-07-19 DIAGNOSIS — T8201XS Breakdown (mechanical) of heart valve prosthesis, sequela: Secondary | ICD-10-CM

## 2013-07-19 DIAGNOSIS — R7989 Other specified abnormal findings of blood chemistry: Secondary | ICD-10-CM

## 2013-07-19 DIAGNOSIS — Z953 Presence of xenogenic heart valve: Secondary | ICD-10-CM

## 2013-07-19 DIAGNOSIS — I079 Rheumatic tricuspid valve disease, unspecified: Secondary | ICD-10-CM | POA: Diagnosis present

## 2013-07-19 DIAGNOSIS — T826XXS Infection and inflammatory reaction due to cardiac valve prosthesis, sequela: Secondary | ICD-10-CM

## 2013-07-19 DIAGNOSIS — T826XXA Infection and inflammatory reaction due to cardiac valve prosthesis, initial encounter: Secondary | ICD-10-CM

## 2013-07-19 DIAGNOSIS — D649 Anemia, unspecified: Secondary | ICD-10-CM | POA: Diagnosis present

## 2013-07-19 DIAGNOSIS — R6510 Systemic inflammatory response syndrome (SIRS) of non-infectious origin without acute organ dysfunction: Secondary | ICD-10-CM

## 2013-07-19 DIAGNOSIS — Z9119 Patient's noncompliance with other medical treatment and regimen: Secondary | ICD-10-CM | POA: Diagnosis not present

## 2013-07-19 DIAGNOSIS — R0600 Dyspnea, unspecified: Secondary | ICD-10-CM | POA: Diagnosis present

## 2013-07-19 DIAGNOSIS — B1920 Unspecified viral hepatitis C without hepatic coma: Secondary | ICD-10-CM | POA: Diagnosis present

## 2013-07-19 DIAGNOSIS — I330 Acute and subacute infective endocarditis: Secondary | ICD-10-CM

## 2013-07-19 DIAGNOSIS — I272 Pulmonary hypertension, unspecified: Secondary | ICD-10-CM

## 2013-07-19 HISTORY — DX: Shortness of breath: R06.02

## 2013-07-19 LAB — COMPREHENSIVE METABOLIC PANEL
ALBUMIN: 3.8 g/dL (ref 3.5–5.2)
ALT: 17 U/L (ref 0–35)
ANION GAP: 16 — AB (ref 5–15)
AST: 22 U/L (ref 0–37)
Alkaline Phosphatase: 97 U/L (ref 39–117)
BUN: 17 mg/dL (ref 6–23)
CO2: 20 mEq/L (ref 19–32)
CREATININE: 0.99 mg/dL (ref 0.50–1.10)
Calcium: 8.8 mg/dL (ref 8.4–10.5)
Chloride: 101 mEq/L (ref 96–112)
GFR calc Af Amer: 79 mL/min — ABNORMAL LOW (ref >90)
GFR calc non Af Amer: 68 mL/min — ABNORMAL LOW (ref >90)
Glucose, Bld: 82 mg/dL (ref 70–99)
POTASSIUM: 4.7 meq/L (ref 3.7–5.3)
Sodium: 137 mEq/L (ref 137–147)
TOTAL PROTEIN: 7.8 g/dL (ref 6.0–8.3)
Total Bilirubin: 0.4 mg/dL (ref 0.3–1.2)

## 2013-07-19 LAB — TROPONIN I
Troponin I: 0.34 ng/mL (ref <0.30)
Troponin I: <0.3 ng/mL (ref <0.30)

## 2013-07-19 LAB — BASIC METABOLIC PANEL
ANION GAP: 13 (ref 5–15)
BUN: 16 mg/dL (ref 6–23)
CHLORIDE: 101 meq/L (ref 96–112)
CO2: 26 meq/L (ref 19–32)
Calcium: 9.5 mg/dL (ref 8.4–10.5)
Creatinine, Ser: 1.23 mg/dL — ABNORMAL HIGH (ref 0.50–1.10)
GFR calc non Af Amer: 52 mL/min — ABNORMAL LOW (ref >90)
GFR, EST AFRICAN AMERICAN: 60 mL/min — AB (ref >90)
Glucose, Bld: 87 mg/dL (ref 70–99)
Potassium: 4.5 mEq/L (ref 3.7–5.3)
Sodium: 140 mEq/L (ref 137–147)

## 2013-07-19 LAB — URINALYSIS, ROUTINE W REFLEX MICROSCOPIC
Bilirubin Urine: NEGATIVE
Glucose, UA: NEGATIVE mg/dL
HGB URINE DIPSTICK: NEGATIVE
Ketones, ur: NEGATIVE mg/dL
Nitrite: NEGATIVE
PROTEIN: NEGATIVE mg/dL
Specific Gravity, Urine: 1.015 (ref 1.005–1.030)
Urobilinogen, UA: 0.2 mg/dL (ref 0.0–1.0)
pH: 6.5 (ref 5.0–8.0)

## 2013-07-19 LAB — CBC WITH DIFFERENTIAL/PLATELET
Basophils Absolute: 0.1 10*3/uL (ref 0.0–0.1)
Basophils Relative: 1 % (ref 0–1)
EOS PCT: 5 % (ref 0–5)
Eosinophils Absolute: 0.3 10*3/uL (ref 0.0–0.7)
HEMATOCRIT: 37.1 % (ref 36.0–46.0)
HEMOGLOBIN: 11.9 g/dL — AB (ref 12.0–15.0)
LYMPHS ABS: 1.5 10*3/uL (ref 0.7–4.0)
LYMPHS PCT: 32 % (ref 12–46)
MCH: 27 pg (ref 26.0–34.0)
MCHC: 32.1 g/dL (ref 30.0–36.0)
MCV: 84.3 fL (ref 78.0–100.0)
MONO ABS: 0.4 10*3/uL (ref 0.1–1.0)
MONOS PCT: 9 % (ref 3–12)
NEUTROS ABS: 2.4 10*3/uL (ref 1.7–7.7)
Neutrophils Relative %: 53 % (ref 43–77)
Platelets: 208 10*3/uL (ref 150–400)
RBC: 4.4 MIL/uL (ref 3.87–5.11)
RDW: 16.7 % — ABNORMAL HIGH (ref 11.5–15.5)
WBC: 4.6 10*3/uL (ref 4.0–10.5)

## 2013-07-19 LAB — SEDIMENTATION RATE: Sed Rate: 14 mm/hr (ref 0–22)

## 2013-07-19 LAB — CBC
HEMATOCRIT: 36.7 % (ref 36.0–46.0)
Hemoglobin: 11.8 g/dL — ABNORMAL LOW (ref 12.0–15.0)
MCH: 27.6 pg (ref 26.0–34.0)
MCHC: 32.2 g/dL (ref 30.0–36.0)
MCV: 85.7 fL (ref 78.0–100.0)
PLATELETS: 222 10*3/uL (ref 150–400)
RBC: 4.28 MIL/uL (ref 3.87–5.11)
RDW: 16.9 % — AB (ref 11.5–15.5)
WBC: 5.5 10*3/uL (ref 4.0–10.5)

## 2013-07-19 LAB — RAPID URINE DRUG SCREEN, HOSP PERFORMED
Amphetamines: NOT DETECTED
Barbiturates: NOT DETECTED
Benzodiazepines: POSITIVE — AB
Cocaine: NOT DETECTED
OPIATES: NOT DETECTED
TETRAHYDROCANNABINOL: POSITIVE — AB

## 2013-07-19 LAB — URINE MICROSCOPIC-ADD ON

## 2013-07-19 LAB — I-STAT TROPONIN, ED: TROPONIN I, POC: 0.14 ng/mL — AB (ref 0.00–0.08)

## 2013-07-19 LAB — PROCALCITONIN: Procalcitonin: <0.1 ng/mL

## 2013-07-19 LAB — PREGNANCY, URINE: PREG TEST UR: NEGATIVE

## 2013-07-19 LAB — PRO B NATRIURETIC PEPTIDE: Pro B Natriuretic peptide (BNP): 2764 pg/mL — ABNORMAL HIGH (ref 0–125)

## 2013-07-19 MED ORDER — ACETAMINOPHEN 650 MG RE SUPP: 650.0000 mg | RECTAL | Status: DC | PRN

## 2013-07-19 MED ORDER — ALUM & MAG HYDROXIDE-SIMETH 200-200-20 MG/5ML PO SUSP
30.0000 mL | Freq: Four times a day (QID) | ORAL | Status: DC | PRN
  Administered 2013-07-19 – 2013-07-25 (×3): 30 mL via ORAL
  Filled 2013-07-19 (×3): qty 30

## 2013-07-19 MED ORDER — ONDANSETRON HCL 4 MG PO TABS
4.0000 mg | ORAL_TABLET | Freq: Four times a day (QID) | ORAL | Status: DC | PRN
  Administered 2013-07-25 – 2013-07-26 (×3): 4 mg via ORAL
  Filled 2013-07-19 (×3): qty 1

## 2013-07-19 MED ORDER — AMOXICILLIN 500 MG PO CAPS
500.0000 mg | ORAL_CAPSULE | Freq: Three times a day (TID) | ORAL | Status: DC
  Administered 2013-07-19 – 2013-07-26 (×22): 500 mg via ORAL
  Filled 2013-07-19 (×25): qty 1

## 2013-07-19 MED ORDER — FUROSEMIDE 10 MG/ML IJ SOLN: 40.0000 mg | Freq: Once | INTRAMUSCULAR | Status: DC

## 2013-07-19 MED ORDER — FUROSEMIDE 10 MG/ML IJ SOLN
20.0000 mg | Freq: Two times a day (BID) | INTRAMUSCULAR | Status: DC
  Administered 2013-07-19 – 2013-07-20 (×2): 20 mg via INTRAVENOUS
  Filled 2013-07-19 (×2): qty 2

## 2013-07-19 MED ORDER — ONDANSETRON HCL 4 MG/2ML IJ SOLN
4.0000 mg | Freq: Four times a day (QID) | INTRAMUSCULAR | Status: DC | PRN
  Administered 2013-07-20 – 2013-07-24 (×5): 4 mg via INTRAVENOUS
  Filled 2013-07-19 (×5): qty 2

## 2013-07-19 MED ORDER — ACETAMINOPHEN 325 MG PO TABS
650.0000 mg | ORAL_TABLET | ORAL | Status: DC | PRN
  Administered 2013-07-20 (×2): 650 mg via ORAL
  Filled 2013-07-19 (×2): qty 2

## 2013-07-19 MED ORDER — ASPIRIN EC 325 MG PO TBEC
325.0000 mg | DELAYED_RELEASE_TABLET | Freq: Every day | ORAL | Status: DC
  Administered 2013-07-19 – 2013-07-20 (×2): 325 mg via ORAL
  Filled 2013-07-19 (×2): qty 1

## 2013-07-19 MED ORDER — SODIUM CHLORIDE 0.9 % IJ SOLN
3.0000 mL | Freq: Two times a day (BID) | INTRAMUSCULAR | Status: DC
  Administered 2013-07-19 – 2013-07-25 (×11): 3 mL via INTRAVENOUS

## 2013-07-19 MED ORDER — FENTANYL CITRATE 0.05 MG/ML IJ SOLN
50.0000 ug | Freq: Once | INTRAMUSCULAR | Status: AC
  Administered 2013-07-19: 50 ug via INTRAVENOUS
  Filled 2013-07-19: qty 2

## 2013-07-19 MED ORDER — IBUPROFEN 600 MG PO TABS
600.0000 mg | ORAL_TABLET | Freq: Once | ORAL | Status: AC
  Administered 2013-07-19: 600 mg via ORAL
  Filled 2013-07-19: qty 1

## 2013-07-19 MED ORDER — LEVOFLOXACIN IN D5W 750 MG/150ML IV SOLN
750.0000 mg | Freq: Once | INTRAVENOUS | Status: AC
Start: 2013-07-19 — End: 2013-07-19
  Administered 2013-07-19: 750 mg via INTRAVENOUS
  Filled 2013-07-19 (×2): qty 150

## 2013-07-19 MED ORDER — LEVOFLOXACIN IN D5W 750 MG/150ML IV SOLN
750.0000 mg | INTRAVENOUS | Status: DC
  Administered 2013-07-20: 750 mg via INTRAVENOUS
  Filled 2013-07-19: qty 150

## 2013-07-19 MED ORDER — SODIUM CHLORIDE 0.9 % IJ SOLN
3.0000 mL | Freq: Two times a day (BID) | INTRAMUSCULAR | Status: DC
  Administered 2013-07-19 – 2013-07-26 (×11): 3 mL via INTRAVENOUS

## 2013-07-19 MED ORDER — ACETAMINOPHEN 325 MG PO TABS
650.0000 mg | ORAL_TABLET | Freq: Four times a day (QID) | ORAL | Status: DC | PRN
  Administered 2013-07-19: 650 mg via ORAL
  Filled 2013-07-19: qty 2

## 2013-07-19 MED ORDER — PNEUMOCOCCAL VAC POLYVALENT 25 MCG/0.5ML IJ INJ
0.5000 mL | INJECTION | INTRAMUSCULAR | Status: DC
  Filled 2013-07-19: qty 0.5

## 2013-07-19 MED ORDER — ACETAMINOPHEN 650 MG RE SUPP: 650.0000 mg | Freq: Four times a day (QID) | RECTAL | Status: DC | PRN

## 2013-07-19 MED ORDER — FUROSEMIDE 10 MG/ML IJ SOLN
40.0000 mg | Freq: Once | INTRAMUSCULAR | Status: AC
  Administered 2013-07-19: 40 mg via INTRAVENOUS
  Filled 2013-07-19: qty 4

## 2013-07-19 MED ORDER — IOHEXOL 350 MG/ML SOLN
80.0000 mL | Freq: Once | INTRAVENOUS | Status: AC | PRN
  Administered 2013-07-19: 80 mL via INTRAVENOUS

## 2013-07-19 MED ORDER — ENOXAPARIN SODIUM 40 MG/0.4ML ~~LOC~~ SOLN
40.0000 mg | SUBCUTANEOUS | Status: DC
  Administered 2013-07-19 – 2013-07-21 (×3): 40 mg via SUBCUTANEOUS
  Filled 2013-07-19 (×3): qty 0.4

## 2013-07-19 MED ORDER — LEVOTHYROXINE SODIUM 100 MCG PO TABS
100.0000 ug | ORAL_TABLET | Freq: Every day | ORAL | Status: DC
  Administered 2013-07-19 – 2013-07-21 (×3): 100 ug via ORAL
  Filled 2013-07-19 (×4): qty 1

## 2013-07-19 MED ORDER — MORPHINE SULFATE 2 MG/ML IJ SOLN
1.0000 mg | INTRAMUSCULAR | Status: DC | PRN
  Administered 2013-07-19 – 2013-07-23 (×22): 1 mg via INTRAVENOUS
  Filled 2013-07-19 (×22): qty 1

## 2013-07-19 NOTE — ED Notes (Signed)
CRITICAL VALUE ALERT  Critical value received: troponin 0.34  Date of notification: 07/20/2013  Time of notification:  01:26  Critical value read back: Yes  Nurse who received alert:    MD notified (1st page):    Time of first page:  MD notified (2nd page):  Time of second page:  Responding MD:    Time MD responded:

## 2013-07-19 NOTE — Progress Notes (Signed)
43yo female c/o SOB x4d associated w/ weakness, back pain, and nausea, CT negative for PE but shows possible infiltration, to begin IV ABX.  WIll begin Levaquin 750mg  IV Q24H and monitor CBC, Cx.  Vernard Gambles, PharmD, BCPS 07/19/2013 6:39 AM

## 2013-07-19 NOTE — Progress Notes (Signed)
The patient arrived to 3E26 from the ED at 0620 this morning.  She was A&Ox4 and had complaints of lingering chest pain.  She received morphine for her chest pain just prior to arriving to the floor.  She is on 2L O2 via N/C as needed.  Her VS are stable.  She is up ad lib.  The call Oneil was placed within reach.

## 2013-07-19 NOTE — ED Provider Notes (Signed)
CSN: [id number]     Arrival date & time 07/18/13  2336 History   First MD Initiated Contact with Patient 07/19/13 [phone]     Chief Complaint  Patient presents with  . Shortness of Breath  . Back Pain     (Consider location/radiation/quality/duration/timing/severity/associated sxs/prior Treatment) HPI 43 year old female presents to the emergency department with complaint of 3-4 days of worsening shortness of breath.  She describes orthopnea, dyspnea on exertion, and PND.  Patient reports worsening of her chronic lower back pain and upper back pain.  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  Patient is on chronic amoxicillin.  She is no longer on IV antibiotics and having a normal TEE.  Patient denies chest pain, no headache, no fevers.  She reports chronic cough.  No change in sputum.  She denies any current drug use.  No prior history of congestive heart failure or MI. Past Medical History  Diagnosis Date  . Bacterial endocarditis     a. 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 b. re-do mitral valve replacement, tricuspid valve replacement, and aortic valve replacement (all bioprosthetic).  [name] DJD (degenerative joint disease)   . Hepatitis C   . Anemia   . Hypothyroidism   . 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 Anxiety   . Mental disorder   . Chronic pain   . Noncompliance   . Polysubstance abuse   . Sepsis     a. 05/2010 with sepsis, vegegations on valve replacements, ARDS, VDRF, multiorgan failure with elevated cardiac enzymes, suffered a left fronto-parietal subarachnoid hemorrhage, pulmonary infarcts, ARF requiring brief hemodialysis. s/p perciardial MVR/TVR.  [name] Atrial flutter     a. Post-op from valve replacement 06/2010.  [name] CHB (complete heart block)    a. After valve replacement 09/2012 - pacer placement.  . Opiate abuse, episodic   . S/P AVR, redo MVR and redo TVR with bioprosthetic valves 09/26/2012    All bioprosthetic tissue valves placed for Strep viridans prosthetic valve endocarditis by Dr. Nathaniel Man @ [facility]  . S/P placement of cardiac pacemaker 10/08/2012    Transvenous permanent pacemaker for CHB following redo TVR, redo MVR, AVR @ [facility]  . 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 Cholecystectomy    . Tonsillectomy and adenoidectomy    . Valve replacement  09/26/2012    AVR + redo MVR + redo TVR for PVE @ [facility]  . Valve replacement  06/2010    MVR + TVR for bacterial endocarditis @ [facility]  . Pacemaker placement  10/08/2012    [facility] for CHB after redo TVR  . Cataract extraction    . Multiple extractions with alveoloplasty  09/01/2011    Procedure: MULTIPLE EXTRACION WITH ALVEOLOPLASTY;  Surgeon: Georgia Lopes, DDS;  Location: [facility] OR;  Service: Oral Surgery;  Laterality: N/A;  . Tee without cardioversion N/A 02/09/2013    Procedure: TRANSESOPHAGEAL ECHOCARDIOGRAM (TEE);  Surgeon: Laurey Morale, MD;  Location: [facility] ENDOSCOPY;  Service: Cardiovascular;  Laterality: N/A;  pt awaker, responsive,..tol procedure well...MAP 76  .  Tee without cardioversion N/A 04/27/2013    Procedure: TRANSESOPHAGEAL ECHOCARDIOGRAM (TEE);  Surgeon: Chrystie NoseKenneth C. Hilty, MD;  Location[facility] ENDOSCOPY;  Service: Cardiovascular;  Laterality: N/A;   Family History  Problem Relation Age of Onset  . Cancer    . Coronary artery disease    . Cancer Maternal Uncle   . Cancer Paternal Aunt   . Heart disease Maternal Grandfather   . Cancer Paternal Grandmother   . Cancer Paternal Grandfather    History  Substance Use Topics  . Smoking  status: Current Some Day Smoker -- 1.00 packs/day for 30 years    Types: Cigarettes    Last Attempt to Quit: 02/13/2013  . Smokeless tobacco: Never Used     Comment: 1 pack every 3 days - smoking vapor cigs   . Alcohol Use: No   OB History   Grav Para Term Preterm Abortions TAB SAB Ect Mult Living                 Review of Systems   See History of Present Illness; otherwise all other systems are reviewed and negative  Allergies  Review of patient's allergies indicates no known allergies.  Home Medications   Prior to Admission medications   Medication Sig Start Date End Date Taking? Authorizing Provider  amoxicillin (AMOXIL) 500 MG capsule Take 1 capsule (500 mg total) by mouth 3 (three) times daily. 05/03/13  Yes Judyann Munsonynthia Snider, MD  aspirin 81 MG chewable tablet Chew 81 mg by mouth daily.   Yes Historical Provider, MD  Doxylamine Succinate, Sleep, (SLEEP AID PO) Take 1 tablet by mouth at bedtime as needed (for sleep).   Yes Historical Provider, MD  levothyroxine (SYNTHROID, LEVOTHROID) 100 MCG tablet Take 1 tablet (100 mcg total) by mouth daily before breakfast. 04/06/13  Yes Deepak Advani, MD  naproxen sodium (ANAPROX) 220 MG tablet Take 220 mg by mouth as needed (pain).    Yes Historical Provider, MD  promethazine (PHENERGAN) 25 MG tablet Take 25 mg by mouth every 6 (six) hours as needed for nausea or vomiting.   Yes Historical Provider, MD  Pseudoephedrine-APAP-DM (DAYQUIL MULTI-SYMPTOM COLD/FLU PO) Take 2 capsules by mouth daily as needed (for cold).   Yes Historical Provider, MD   BP 110/68  Pulse 74  Temp(Src) 98 F (36.7 C) (Oral)  Resp 22  SpO2 100%  LMP 07/14/2013 Physical Exam  Nursing note and vitals reviewed. Constitutional: She is oriented to person, place, and time. She appears well-developed and well-nourished. No distress.  HENT:  Head: Normocephalic and atraumatic.  Nose: Nose normal.  Mouth/Throat: Oropharynx is clear and moist.  Eyes: Conjunctivae and EOM  are normal. Pupils are equal, round, and reactive to light.  Neck: Normal range of motion. Neck supple. No JVD present. No tracheal deviation present. No thyromegaly present.  Cardiovascular: Normal rate, regular rhythm and intact distal pulses.  Exam reveals no gallop and no friction rub.   Murmur heard. Pulmonary/Chest: Effort normal and breath sounds normal. No stridor. No respiratory distress. She has no wheezes. She has no rales. She exhibits no tenderness.  Abdominal: Soft. Bowel sounds are normal. She exhibits no distension and no mass. There is no tenderness. There is no rebound and no guarding.  Musculoskeletal: Normal range of motion. She exhibits edema (trace in lower extremities) and tenderness (patient is point tender along her spine, starting in upper thoracic to lower spine, mainly in the paraspinal muscle groups).  Lymphadenopathy:    She has no cervical adenopathy.  Neurological: She is alert and oriented to person, place, and time. She exhibits normal muscle tone. Coordination normal.  Skin: Skin is warm and dry. No rash noted. No erythema. No pallor.  Psychiatric: She has a normal mood and affect. Her behavior is normal. Judgment and thought content normal.    ED Course  Procedures (including critical care time) Labs Review Labs Reviewed  CBC - Abnormal; Notable for the following:    Hemoglobin 11.8 (*)    RDW 16.9 (*)    All other components within normal limits  BASIC METABOLIC PANEL - Abnormal; Notable for the following:    Creatinine, Ser 1.23 (*)    GFR calc non Af Amer 52 (*)    GFR calc Af Amer 60 (*)    All other components within normal limits  PRO B NATRIURETIC PEPTIDE - Abnormal; Notable for the following:    Pro B Natriuretic peptide (BNP) 2764.0 (*)    All other components within normal limits  TROPONIN I - Abnormal; Notable for the following:    Troponin I 0.34 (*)    All other components within normal limits  URINALYSIS, ROUTINE W REFLEX MICROSCOPIC  - Abnormal; Notable for the following:    APPearance CLOUDY (*)    Leukocytes, UA MODERATE (*)    All other components within normal limits  URINE RAPID DRUG SCREEN (HOSP PERFORMED) - Abnormal; Notable for the following:    Benzodiazepines POSITIVE (*)    Tetrahydrocannabinol POSITIVE (*)    All other components within normal limits  I-STAT TROPOININ, ED - Abnormal; Notable for the following:    Troponin i, poc 0.14 (*)    All other components within normal limits  CULTURE, BLOOD (ROUTINE X 2)  CULTURE, BLOOD (ROUTINE X 2)  URINE CULTURE  SEDIMENTATION RATE  URINE MICROSCOPIC-ADD ON  PREGNANCY, URINE  PROCALCITONIN  TROPONIN I  TROPONIN I  TROPONIN I  COMPREHENSIVE METABOLIC PANEL  CBC WITH DIFFERENTIAL  CBC    Imaging Review Dg Chest 2 View  07/19/2013   CLINICAL DATA:  SHORTNESS OF BREATH BACK PAIN  EXAM: CHEST  2 VIEW  COMPARISON:  Prior radiograph from 02/11/2013  FINDINGS: Median sternotomy wires with underlying prostatic bowels are noted, unchanged. Pacemaker generator overlies the left abdomen, also stable. Mediastinal silhouette within normal limits.  Lungs are normally inflated. Patchy infiltrate present within the right lower lobe, suspicious for pneumonia. No other definite focal infiltrates. No pulmonary edema. Minimal blunting of the right costophrenic angle is suggestive of a small right pleural effusion. Mild elevation of the lateral right hemidiaphragm suggested possible subpulmonic component. No pneumothorax.  No acute osseus abnormality.  IMPRESSION: 1. Patchy right lower lobe infiltrate, suspicious for pneumonia. 2. Blunting of the right costophrenic angle, suggestive of associated small parapneumonic effusion.   Electronically Signed   By: Rise Mu M.D.   On: 07/19/2013 00:39   Ct Angio Chest W/cm &/or Wo Cm  07/19/2013   CLINICAL DATA:  Shortness of breath for 4 days. Back pain between the shoulder blades. Nausea.  EXAM: CT ANGIOGRAPHY CHEST WITH CONTRAST   TECHNIQUE: Multidetector CT imaging of the chest was performed using the standard protocol during bolus administration of intravenous contrast. Multiplanar CT image reconstructions and MIPs were obtained to evaluate the vascular anatomy.  CONTRAST:  80mL OMNIPAQUE IOHEXOL 350 MG/ML SOLN  COMPARISON:  02/07/2013  FINDINGS: Technically adequate study with good opacification of the central and segmental pulmonary arteries. No focal filling defects. No evidence of significant pulmonary embolus.  Mild cardiac enlargement. Postoperative changes in the mediastinum with mitral valve replacement. Aortic valve replacement. Normal caliber thoracic aorta without evidence of dissection. Great vessel origins are patent. No significant lymphadenopathy in the chest. Esophagus is decompressed. There is suggestion of mild esophageal wall thickening which could indicate esophagitis. Small right pleural effusion. Atelectasis in both lung bases. Patchy scarring versus consolidation in the right lung base. Airways appear patent. No pneumothorax.  Review of the MIP images confirms the above findings.  IMPRESSION: No evidence of significant pulmonary embolus. Cardiac enlargement. Atelectasis and/ infiltration in the lung bases.   Electronically Signed   By: Burman Nieves M.D.   On: 07/19/2013 04:03     EKG Interpretation   Date/Time:  Monday July 18 2013 23:41:29 EDT Ventricular Rate:  84 PR Interval:  132 QRS Duration: 148 QT Interval:  442 QTC Calculation: 522 R Axis:   -83 Text Interpretation:  Electronic ventricular pacemaker No significant  change since last tracing Confirmed by Emmerson Shuffield  MD, Gemma Ruan ([id number]) on 07/19/2013  12:45:14 AM      MDM   Final diagnoses:  Dyspnea  Elevated troponin  Prosthetic heart valve failure, sequela  History of bacterial endocarditis    43 year old female with worsening shortness of breath over the last several days, noted to have elevated troponin.  She has complicated past  medical history with multiple prior valve replacements, but no prior history of coronary disease.  No history of CHF.  She has elevated BNP without significant pulmonary edema noted on chest x-ray.  Chest x-ray does show some possible infiltrate on the right, the patient has not been febrile and does not have a white count and is currently on antibiotics.  Will d/w cardiology regarding troponin.  Expect admission.    Olivia Mackie, MD 07/19/13 [phone]

## 2013-07-19 NOTE — ED Notes (Signed)
MD at bedside. 

## 2013-07-19 NOTE — Progress Notes (Signed)
Followup progress note:  Patient admitted earlier this morning. Seen after arrival to floor. Feeling about the same. Minimal chest discomfort, but bigger complaint is ongoing shortness of breath.  Cardiovascular: Regular rate and rhythm with occasional ectopic beat. 3/6 holosystolic murmur and sharp S2 Lungs: Moderate inspiratory effort and decreased breath sounds bibasilar  Assessment and plan: Acute diastolic heart failure: BNP elevated. Have started IV Lasix.   Hypothyroidism continue Synthroid Minimally elevated troponin: Followed by cardiology. They be more secondary to CHF. History of endocarditis with valve destruction: That pro calcitonin level normal, suspect heart failure is less likely endocarditis. Cardiology did initial consult and has talked about getting TEE. May not be necessary. We'll reassess in morning

## 2013-07-19 NOTE — H&P (Signed)
[facility] History and Physical  [name]:[id number] DOB: 08-01-1970 DOA: 07/19/2013  Referring physician: ER physician. PCP: Doris Cheadle, MD   Chief Complaint: Shortness of breath.  HPI: [name] is a 43 y.o. 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 Patient also occasionally has chest pressure. Patient states she gets short of breath on lying flat and on minimal exertion. At rest she is symptom-free. In the ER patient had CT angiogram of the chest which shows nonspecific infiltrates. Patient's troponin has been positive. Patient presently chest pain-free. Cardiologist on call was consulted and patient has been admitted for further management. Patient states few weeks ago she had some nausea vomiting. Denies any abdominal pain diarrhea. Denies any recent procedures. Patient admits to taking marijuana but denies any IV drug abuse. Patient has had prior history of IV drug abuse and has been free from IV drug abuse for many years as per the patient. Patient otherwise denies any headache visual symptoms focal deficits.  Review of Systems: As presented in the history of presenting illness, rest negative.  Past Medical History  Diagnosis Date  . Bacterial endocarditis     a. 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 b. re-do mitral valve replacement, tricuspid valve replacement, and aortic valve replacement (all bioprosthetic).  [name] DJD (degenerative joint disease)   . Hepatitis C   . Anemia   . Hypothyroidism   . 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 Anxiety   . Mental disorder   . Chronic pain   . Noncompliance   . Polysubstance abuse   . Sepsis     a. 05/2010 with  sepsis, vegegations on valve replacements, ARDS, VDRF, multiorgan failure with elevated cardiac enzymes, suffered a left fronto-parietal subarachnoid hemorrhage, pulmonary infarcts, ARF requiring brief hemodialysis. s/p perciardial MVR/TVR.  [name] Atrial flutter     a. Post-op from valve replacement 06/2010.  [name] CHB (complete heart block)     a. After valve replacement 09/2012 - pacer placement.  . Opiate abuse, episodic   . S/P AVR, redo MVR and redo TVR with bioprosthetic valves 09/26/2012    All bioprosthetic tissue valves placed for Strep viridans prosthetic valve endocarditis by Dr. Nathaniel Man @ [facility]  . S/P placement of cardiac pacemaker 10/08/2012    Transvenous permanent pacemaker for CHB following redo TVR, redo MVR, AVR @ [facility]  . 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 Cholecystectomy    . Tonsillectomy and adenoidectomy    . Valve replacement  09/26/2012    AVR + redo MVR + redo TVR for PVE @ [facility]  . Valve replacement  06/2010    MVR + TVR for bacterial endocarditis @ [facility]  . Pacemaker placement  10/08/2012    [facility] for CHB after redo TVR  . Cataract extraction    . Multiple extractions with alveoloplasty  09/01/2011    Procedure: MULTIPLE EXTRACION WITH ALVEOLOPLASTY;  Surgeon: Georgia Lopes, DDS;  Location: [facility] OR;  Service: Oral Surgery;  Laterality: N/A;  . Tee without cardioversion N/A 02/09/2013    Procedure: TRANSESOPHAGEAL ECHOCARDIOGRAM (TEE);  Surgeon: Laurey Moralealton S McLean, MD;  [facility];  Service: Cardiovascular;  Laterality: N/A;  pt awaker, responsive,..tol procedure well...MAP 76  . Tee without cardioversion N/A 04/27/2013    Procedure: TRANSESOPHAGEAL ECHOCARDIOGRAM (TEE);  Surgeon: Chrystie NoseKenneth C. Hilty, MD;  [facility];   Service: Cardiovascular;  Laterality: N/A;   Social History:  reports that she has been smoking Cigarettes.  She has a 30 pack-year smoking history. She has never used smokeless tobacco. She reports that she uses illicit drugs (Marijuana and Cocaine) about 7 times per week. She reports that she does not drink alcohol. Where does patient live home. Can patient participate in ADLs? Yes.  No Known Allergies  Family History:  Family History  Problem Relation Age of Onset  . Cancer    . Coronary artery disease    . Cancer Maternal Uncle   . Cancer Paternal Aunt   . Heart disease Maternal Grandfather   . Cancer Paternal Grandmother   . Cancer Paternal Grandfather       Prior to Admission medications   Medication Sig Start Date End Date Taking? Authorizing Provider  amoxicillin (AMOXIL) 500 MG capsule Take 1 capsule (500 mg total) by mouth 3 (three) times daily. 05/03/13  Yes Judyann Munsonynthia Snider, MD  aspirin 81 MG chewable tablet Chew 81 mg by mouth daily.   Yes Historical Provider, MD  Doxylamine Succinate, Sleep, (SLEEP AID PO) Take 1 tablet by mouth at bedtime as needed (for sleep).   Yes Historical Provider, MD  levothyroxine (SYNTHROID, LEVOTHROID) 100 MCG tablet Take 1 tablet (100 mcg total) by mouth daily before breakfast. 04/06/13  Yes Deepak Advani, MD  naproxen sodium (ANAPROX) 220 MG tablet Take 220 mg by mouth as needed (pain).    Yes Historical Provider, MD  promethazine (PHENERGAN) 25 MG tablet Take 25 mg by mouth every 6 (six) hours as needed for nausea or vomiting.   Yes Historical Provider, MD  Pseudoephedrine-APAP-DM (DAYQUIL MULTI-SYMPTOM COLD/FLU PO) Take 2 capsules by mouth daily as needed (for cold).   Yes Historical Provider, MD    Physical Exam: Filed Vitals:   07/19/13 0115 07/19/13 0130 07/19/13 0132 07/19/13 0500  BP: 97/62 99/68  107/73  Pulse: 72 69  72  Temp:      TempSrc:      Resp: 18 20  16   SpO2: 99% 97% 99% 100%     General:  Well-developed and  nourished.  Eyes: Anicteric no pallor.  ENT: No discharge from the ears eyes nose mouth.  Neck: No JVD appreciated. No mass felt.  Cardiovascular: S1-S2 heard.  Respiratory: No rhonchi or crepitations.  Abdomen: Soft nontender bowel sounds present. No guarding or rigidity.  Skin: No rash.  Musculoskeletal: No edema.  Psychiatric: Appears normal.  Neurologic: Alert awake oriented to time place and person. Moves all extremities.  Labs on Admission:  Basic Metabolic Panel:  Recent Labs Lab 07/18/13 2351  NA 140  K 4.5  CL 101  CO2 26  GLUCOSE 87  BUN 16  CREATININE 1.23*  CALCIUM 9.5   Liver Function Tests: No results found for this basename: AST, ALT, ALKPHOS, BILITOT, PROT, ALBUMIN,  in the last 168 hours No results found for this basename: LIPASE, AMYLASE,  in the last 168 hours No results found for this basename: AMMONIA,  in the last 168 hours CBC:  Recent Labs Lab 07/18/13 2351  WBC 5.5  HGB 11.8*  HCT 36.7  MCV 85.7  PLT 222   Cardiac Enzymes:  Recent Labs Lab 07/19/13 0020  TROPONINI 0.34*    BNP (last 3 results)  Recent Labs  09/25/12 0936 02/06/13 1951 07/18/13 2351  PROBNP 6304.0* 1450.0* 2764.0*   CBG: No results found for this basename: GLUCAP,  in the last 168 hours  Radiological Exams on Admission: Dg Chest 2 View  07/19/2013   CLINICAL DATA:  SHORTNESS OF BREATH BACK PAIN  EXAM: CHEST  2 VIEW  COMPARISON:  Prior radiograph from 02/11/2013  FINDINGS: Median sternotomy wires with underlying prostatic bowels are noted, unchanged. Pacemaker generator overlies the left abdomen, also stable. Mediastinal silhouette within normal limits.  Lungs are normally inflated. Patchy infiltrate present within the right lower lobe, suspicious for pneumonia. No other definite focal infiltrates. No pulmonary edema. Minimal blunting of the right costophrenic angle is suggestive of a small right pleural effusion. Mild elevation of the lateral right  hemidiaphragm suggested possible subpulmonic component. No pneumothorax.  No acute osseus abnormality.  IMPRESSION: 1. Patchy right lower lobe infiltrate, suspicious for pneumonia. 2. Blunting of the right costophrenic angle, suggestive of associated small parapneumonic effusion.   Electronically Signed   By: Rise Mu M.D.   On: 07/19/2013 00:39   Ct Angio Chest W/cm &/or Wo Cm  07/19/2013   CLINICAL DATA:  Shortness of breath for 4 days. Back pain between the shoulder blades. Nausea.  EXAM: CT ANGIOGRAPHY CHEST WITH CONTRAST  TECHNIQUE: Multidetector CT imaging of the chest was performed using the standard protocol during bolus administration of intravenous contrast. Multiplanar CT image reconstructions and MIPs were obtained to evaluate the vascular anatomy.  CONTRAST:  80mL OMNIPAQUE IOHEXOL 350 MG/ML SOLN  COMPARISON:  02/07/2013  FINDINGS: Technically adequate study with good opacification of the central and segmental pulmonary arteries. No focal filling defects. No evidence of significant pulmonary embolus.  Mild cardiac enlargement. Postoperative changes in the mediastinum with mitral valve replacement. Aortic valve replacement. Normal caliber thoracic aorta without evidence of dissection. Great vessel origins are patent. No significant lymphadenopathy in the chest. Esophagus is decompressed. There is suggestion of mild esophageal wall thickening which could indicate esophagitis. Small right pleural effusion. Atelectasis in both lung bases. Patchy scarring versus consolidation in the right lung base. Airways appear patent. No pneumothorax.  Review of the MIP images confirms the above findings.  IMPRESSION: No evidence of significant pulmonary embolus. Cardiac enlargement. Atelectasis and/ infiltration in the lung bases.   Electronically Signed   By: Burman Nieves M.D.   On: 07/19/2013 04:03    EKG: Independently reviewed. Paced rhythm.  Assessment/Plan Principal Problem:   Shortness of  breath Active Problems:   Hypothyroidism   Substance abuse   Anemia   Prosthetic heart valve failure- MVR/TVR 6/12, AOV 9/14   Hepatitis C   1. Shortness of breath - concerning for CHF in a patient with redo bioprosthetic aortic mitral and tricuspid valves with status post pacemaker for complete heart block. Blood cultures have been obtained. Patient is afebrile at this time but since CT chest shows nonspecific infiltrates I have placed patient on Levaquin and also get procalcitonin levels based on which we will decide if patient needs to be on antibiotics. Cardiology is planning 2-D echo. 2. Positive troponins - cycle cardiac markers. Aspirin. Presently chest pain-free. Patient will be kept n.p.o. for now. 3. Chronic anemia - follow CBC. 4. Hypothyroidism - continue Synthroid.  5. History of hepatitis C. 6. History of substance abuse - patient states she has stopped using any IV drugs for many years except for marijuana. Smoking cessation counseling requested.    Code Status: Full code.  Family Communication: Patient's daughter at the bedside.  Disposition Plan: Admit to inpatient.    Athziri Freundlich N. [facility] Pager [phone].  If 7PM-7AM, please contact night-coverage [email or web address] Password [email or web address] 07/19/2013, 5:42 AM

## 2013-07-19 NOTE — ED Notes (Signed)
Dr. Norlene Campbell, Zack[facility]Charge and [facility] notified of elevated I-stat troponin.  Pt moved to Rm. 13 immediately.

## 2013-07-19 NOTE — Consult Note (Signed)
Cardiology Consultation Note  Patient ID: [name], MRN: [id number], DOB/AGE: [birth year] 43 y.o. Admit date: 07/19/2013   Date of Consult: 07/19/2013 Primary Physician: Doris Cheadle, MD  Chief Complaint: SOB  Reason for Consult: Elevated troponin    Assessment and Plan:  Elevated troponin  Shortness of breath  Hx of Bioprosthetic valves   Plan  Evaluate valve with TTE for possible endocarditis recurrence and valvular incompetence Check blood culture  Agree with CTPA to eval for Air space disease and PE  Trend CE for now, suspect troponin elevation demand related ( no chest pain , risk factor for CAD and stable EKG )    43 y/o F with history of endocarditis X2  s/p redo bioprosthetic AVR/ MVR/TVR 09/2012  , heart block  s/p PPM , polysubstance abuse including IVDA, hepatitis C, and noncompliance who presented to [facility] with SOB   HPI: pt had TEE on 04/27/2013 that showed resolution of endocarditis and her IV antibiotics were d/c and now only on chronic amoxicillin therapy. States that over the past few days she has been getting increasingly SOB with minimal exertion around the house,. She also has three pillow orthopnea, PND and mild LE edema . Denies any  Overt chest pain, focal weakness, syncope, bleeding diathesis , claudication , palpitation etc .  Reports medication compliance currently  Reports upper back pain with arm movements    Past Medical History  Diagnosis Date  . Bacterial endocarditis     a. s/p MVR and TVR at [facility] 06/2010 secondary to endocarditis related to IVDA; normal cors by cath 06/2010 at [facility] b. re-do mitral valve replacement, tricuspid valve replacement, and aortic valve replacement (all bioprosthetic).  [name] DJD (degenerative joint disease)   . Hepatitis C   . Anemia   . Hypothyroidism   . 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 Anxiety   . Mental disorder   . Chronic pain   . Noncompliance   . Polysubstance abuse   . Sepsis     a.  05/2010 with sepsis, vegegations on valve replacements, ARDS, VDRF, multiorgan failure with elevated cardiac enzymes, suffered a left fronto-parietal subarachnoid hemorrhage, pulmonary infarcts, ARF requiring brief hemodialysis. s/p perciardial MVR/TVR.  [name] Atrial flutter     a. Post-op from valve replacement 06/2010.  [name] CHB (complete heart block)     a. After valve replacement 09/2012 - pacer placement.  . Opiate abuse, episodic   . S/P AVR, redo MVR and redo TVR with bioprosthetic valves 09/26/2012    All bioprosthetic tissue valves placed for Strep viridans prosthetic valve endocarditis by Dr. Nathaniel Man @ [facility]  . S/P placement of cardiac pacemaker 10/08/2012    Transvenous permanent pacemaker for CHB following redo TVR, redo MVR, AVR @ [facility]  . 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 Systolic function was normal. The estimated ejection fraction was in the range of 55% to 60%. - Aortic valve: Aortic valve bioprosthesis is well-seated. leaflets appear thin and pliable. The annulus is somewhat small. No vegetation or paravalvular abscess is noted. No regurgitation is noted. - Mitral valve: There is a well-seated bioprosthetic valve without vegetation or stenosis. No  regurgitation. - Left atrium: No evidence of thrombus in the atrial cavity or appendage. - Atrial septum: No defect or patent foramen ovale was identified by color doppler. - Tricuspid valve: Bioprosthetic tricuspid valve. There is a hypomobile lateral leaflet, leading to malcoaptation and moderate, eccentric regurgitation. TR gradient was 30 mmHg. No vegetation. - Pulmonic valve: Structurally normal native valve.  Cusp separation was normal. No evidence of vegetation. Impressions:  - No evidence of endocarditis.    Surgical History:  Past Surgical History  Procedure Laterality Date  . Cholecystectomy    . Tonsillectomy and adenoidectomy    . Valve replacement  09/26/2012    AVR + redo MVR + redo TVR for PVE @ [facility]  . Valve replacement  06/2010    MVR + TVR for bacterial endocarditis @ [facility]  . Pacemaker placement  10/08/2012    [facility] for CHB after redo TVR  . Cataract extraction    . Multiple extractions with alveoloplasty  09/01/2011    Procedure: MULTIPLE EXTRACION WITH ALVEOLOPLASTY;  Surgeon: Georgia Lopes, DDS;  Location: [facility] OR;  Service: Oral Surgery;  Laterality: N/A;  . Tee without cardioversion N/A 02/09/2013    Procedure: TRANSESOPHAGEAL ECHOCARDIOGRAM (TEE);  Surgeon: Laurey Morale, MD;  Location: [facility] ENDOSCOPY;  Service: Cardiovascular;  Laterality: N/A;  pt awaker, responsive,..tol procedure well...MAP 76  . Tee without cardioversion N/A 04/27/2013    Procedure: TRANSESOPHAGEAL ECHOCARDIOGRAM (TEE);  Surgeon: Chrystie Nose, MD;  Location: [facility] ENDOSCOPY;  Service: Cardiovascular;  Laterality: N/A;     Home Meds: Prior to Admission medications   Medication Sig Start Date End Date Taking? Authorizing Provider  amoxicillin (AMOXIL) 500 MG capsule Take 1 capsule (500 mg total) by mouth 3 (three) times daily. 05/03/13  Yes Judyann Munson, MD  aspirin 81 MG chewable tablet Chew 81 mg by mouth daily.   Yes Historical Provider, MD  Doxylamine Succinate, Sleep, (SLEEP AID PO) Take 1 tablet by mouth at bedtime as needed (for sleep).   Yes Historical Provider, MD  levothyroxine (SYNTHROID, LEVOTHROID) 100 MCG tablet Take 1 tablet (100 mcg total) by mouth daily before breakfast. 04/06/13  Yes Deepak Advani, MD  naproxen sodium (ANAPROX) 220 MG tablet Take 220 mg by mouth as needed (pain).    Yes Historical Provider, MD  promethazine (PHENERGAN) 25 MG tablet Take 25 mg by mouth every 6 (six) hours  as needed for nausea or vomiting.   Yes Historical Provider, MD  Pseudoephedrine-APAP-DM (DAYQUIL MULTI-SYMPTOM COLD/FLU PO) Take 2 capsules by mouth daily as needed (for cold).   Yes Historical Provider, MD    Inpatient Medications:     Allergies: No Known Allergies  History   Social History  . Marital Status: Legally Separated    Spouse Name: N/A    Number of Children: N/A  . Years of Education: N/A   Occupational History  . Not on file.   Social History Main Topics  . Smoking status: Current Some Day Smoker -- 1.00 packs/day for 30 years    Types: Cigarettes    Last Attempt to Quit: 02/13/2013  . Smokeless tobacco: Never Used     Comment: 1 pack every 3 days - smoking vapor cigs   . Alcohol Use: No  . Drug Use: 7.00 per week    Special: Marijuana, Cocaine     Comment: prior IVDA, cocaine. + occasional narcotics  . Sexual Activity: No   Other Topics Concern  . Not on file   Social History Narrative  .  No narrative on file     Family History  Problem Relation Age of Onset  . Cancer    . Coronary artery disease    . Cancer Maternal Uncle   . Cancer Paternal Aunt   . Heart disease Maternal Grandfather   . Cancer Paternal Grandmother   . Cancer Paternal Grandfather      Review of Systems General: negative for chills, fever, night sweats or weight changes.  Cardiovascular:per HPI  Dermatological: negative for rash Urologic: negative for hematuria Abdominal: negative for nausea, vomiting, diarrhea, bright red blood per rectum, melena, or hematemesis Neurologic: negative for visual changes, syncope, or dizziness All other systems reviewed and are otherwise negative except as noted above.  Labs:  Recent Labs  07/19/13 0020  TROPONINI 0.34*   Lab Results  Component Value Date   WBC 5.5 07/18/2013   HGB 11.8* 07/18/2013   HCT 36.7 07/18/2013   MCV 85.7 07/18/2013   PLT 222 07/18/2013    Recent Labs Lab 07/18/13 2351  NA 140  K 4.5  CL 101  CO2 26  BUN  16  CREATININE 1.23*  CALCIUM 9.5  GLUCOSE 87   No results found for this basename: CHOL, HDL, LDLCALC, TRIG   No results found for this basename: DDIMER    Radiology/Studies:  Dg Chest 2 View  07/19/2013   CLINICAL DATA:  SHORTNESS OF BREATH BACK PAIN  EXAM: CHEST  2 VIEW  COMPARISON:  Prior radiograph from 02/11/2013  FINDINGS: Median sternotomy wires with underlying prostatic bowels are noted, unchanged. Pacemaker generator overlies the left abdomen, also stable. Mediastinal silhouette within normal limits.  Lungs are normally inflated. Patchy infiltrate present within the right lower lobe, suspicious for pneumonia. No other definite focal infiltrates. No pulmonary edema. Minimal blunting of the right costophrenic angle is suggestive of a small right pleural effusion. Mild elevation of the lateral right hemidiaphragm suggested possible subpulmonic component. No pneumothorax.  No acute osseus abnormality.  IMPRESSION: 1. Patchy right lower lobe infiltrate, suspicious for pneumonia. 2. Blunting of the right costophrenic angle, suggestive of associated small parapneumonic effusion.   Electronically Signed   By: Rise MuBenjamin  McClintock M.D.   On: 07/19/2013 00:39    EKG: A sensed V paced   Physical Exam: Blood pressure 99/68, pulse 69, temperature 98 F (36.7 C), temperature source Oral, resp. rate 20, last menstrual period 07/14/2013, SpO2 99.00%. General: Well developed, well nourished, in no acute distress.  Neck: Negative for carotid bruits. JVD not elevated. Lungs: Clear bilaterally to auscultation without wheezes, rales, or rhonchi. Breathing is unlabored. Heart: Midline scar  RRR with S1 S2. No diastolic murmur, soft 2/6 pansystolic murmur  Abdomen: Soft, non-tender, non-distended with normoactive bowel sounds. No hepatomegaly. No rebound/guarding. No obvious abdominal masses. Extremities: No clubbing or cyanosis. Trace pedal edema.  Distal pedal pulses are 2+ and equal  bilaterally. Neuro: Alert and oriented X 3. No facial asymmetry. No focal deficit. Moves all extremities spontaneously. Psych:  Responds to questions appropriately with a normal affect.      Lovina ReachSigned, Antwoin Lackey, A M.D  07/19/2013, 2:29 AM

## 2013-07-19 NOTE — Progress Notes (Signed)
UR complete.  Avrey Hyser RN, MSN 

## 2013-07-20 DIAGNOSIS — N179 Acute kidney failure, unspecified: Secondary | ICD-10-CM | POA: Diagnosis not present

## 2013-07-20 DIAGNOSIS — R0609 Other forms of dyspnea: Secondary | ICD-10-CM

## 2013-07-20 DIAGNOSIS — R0989 Other specified symptoms and signs involving the circulatory and respiratory systems: Secondary | ICD-10-CM

## 2013-07-20 DIAGNOSIS — I442 Atrioventricular block, complete: Secondary | ICD-10-CM

## 2013-07-20 DIAGNOSIS — Z950 Presence of cardiac pacemaker: Secondary | ICD-10-CM

## 2013-07-20 DIAGNOSIS — I079 Rheumatic tricuspid valve disease, unspecified: Secondary | ICD-10-CM

## 2013-07-20 DIAGNOSIS — I330 Acute and subacute infective endocarditis: Secondary | ICD-10-CM

## 2013-07-20 DIAGNOSIS — I369 Nonrheumatic tricuspid valve disorder, unspecified: Secondary | ICD-10-CM

## 2013-07-20 DIAGNOSIS — F1910 Other psychoactive substance abuse, uncomplicated: Secondary | ICD-10-CM

## 2013-07-20 LAB — BASIC METABOLIC PANEL
ANION GAP: 13 (ref 5–15)
BUN: 26 mg/dL — ABNORMAL HIGH (ref 6–23)
CHLORIDE: 96 meq/L (ref 96–112)
CO2: 27 mEq/L (ref 19–32)
Calcium: 9.2 mg/dL (ref 8.4–10.5)
Creatinine, Ser: 1.65 mg/dL — ABNORMAL HIGH (ref 0.50–1.10)
GFR, EST AFRICAN AMERICAN: 42 mL/min — AB (ref >90)
GFR, EST NON AFRICAN AMERICAN: 37 mL/min — AB (ref >90)
Glucose, Bld: 103 mg/dL — ABNORMAL HIGH (ref 70–99)
Potassium: 4.9 mEq/L (ref 3.7–5.3)
SODIUM: 136 meq/L — AB (ref 137–147)

## 2013-07-20 LAB — TSH: TSH: 8.82 u[IU]/mL — AB (ref 0.350–4.500)

## 2013-07-20 MED ORDER — ASPIRIN EC 81 MG PO TBEC
81.0000 mg | DELAYED_RELEASE_TABLET | Freq: Every day | ORAL | Status: DC
  Administered 2013-07-21 – 2013-07-26 (×6): 81 mg via ORAL
  Filled 2013-07-20 (×7): qty 1

## 2013-07-20 MED ORDER — FUROSEMIDE 10 MG/ML IJ SOLN
20.0000 mg | Freq: Every day | INTRAMUSCULAR | Status: DC
  Filled 2013-07-20: qty 2

## 2013-07-20 MED ORDER — LEVOFLOXACIN 750 MG PO TABS
750.0000 mg | ORAL_TABLET | ORAL | Status: DC
  Filled 2013-07-20: qty 1

## 2013-07-20 NOTE — Plan of Care (Signed)
Problem: Phase I Progression Outcomes Goal: EF % per last Echo/documented,Core Reminder form on chart Outcome: Completed/Met Date Met:  07/20/13 EF 55-60% per ECHO performed on 04/27/13.

## 2013-07-20 NOTE — Progress Notes (Addendum)
[facility] PROGRESS NOTE  [name] ZOX:[id number] DOB: May 17, 1970 DOA: 07/19/2013 PCP: Doris Cheadle, MD  Assessment/Plan:  Principal Problem:   Shortness of breath likely CHF. Creatinine increased today. Will change lasix to once daily. TTE pending Active Problems: Acute kidney injury: see above   Hypothyroidism: check TSH.   Substance abuse: denies current. Known to me from previous at which time she asked for Rx for methadone or percocet. 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 Has minimal cough, afebrile, normal WBC and procalcitonin <0.10. Will d/c levaquin  Code Status:  full Family Communication:   Disposition Plan:  home  Consultants:  cardiology  Procedures:     Antibiotics:  levaquin  amox  HPI/Subjective: C/o shock like CP (has had this complaint on previous admission). Minimal cough. DOE  Objective: Filed Vitals:   07/20/13 0854  BP: 93/59  Pulse: 76  Temp:   Resp:     Intake/Output Summary (Last 24 hours) at 07/20/13 1231 Last data filed at 07/20/13 1018  Gross per 24 hour  Intake    620 ml  Output   1525 ml  Net   -905 ml   Filed Weights   07/19/13 0623  Weight: 78.155 kg (172 lb 4.8 oz)    Exam:   General:  In dark. Appears comfortable lying nearly flat  Cardiovascular: RRR murmur present  Respiratory: CTA without WRR  Abdomen: s, nt, nd  Ext: minimal edema  Basic Metabolic Panel:  Recent Labs Lab 07/18/13 2351 07/19/13 0937 07/20/13 0515  NA 140 137 136*  K 4.5 4.7 4.9  CL 101 101 96  CO2 26 20 27   GLUCOSE 87 82 103*  BUN 16 17 26*  CREATININE 1.23* 0.99 1.65*  CALCIUM 9.5 8.8 9.2   Liver Function Tests:  Recent Labs Lab 07/19/13 0937  AST 22  ALT 17  ALKPHOS 97  BILITOT 0.4  PROT 7.8   ALBUMIN 3.8   No results found for this basename: LIPASE, AMYLASE,  in the last 168 hours No results found for this basename: AMMONIA,  in the last 168 hours CBC:  Recent Labs Lab 07/18/13 2351 07/19/13 0937  WBC 5.5 4.6  NEUTROABS  --  2.4  HGB 11.8* 11.9*  HCT 36.7 37.1  MCV 85.7 84.3  PLT 222 208   Cardiac Enzymes:  Recent Labs Lab 07/19/13 0020 07/19/13 1158 07/19/13 1830  TROPONINI 0.34* <0.30 <0.30   BNP (last 3 results)  Recent Labs  09/25/12 0936 02/06/13 1951 07/18/13 2351  PROBNP 6304.0* 1450.0* 2764.0*   CBG: No results found for this basename: GLUCAP,  in the last 168 hours  Recent Results (from the past 240 hour(s))  CULTURE, BLOOD (ROUTINE X 2)     Status: None   Collection Time    07/19/13 12:55 AM      Result Value Ref Range Status   Specimen Description BLOOD RIGHT ANTECUBITAL   Final   Special Requests BOTTLES DRAWN AEROBIC AND ANAEROBIC 10CC EA   Final   Culture  Setup Time     Final   Value: 07/19/2013 08:35     Performed at [facility]   Culture     Final   Value:        BLOOD CULTURE  RECEIVED NO GROWTH TO DATE CULTURE WILL BE HELD FOR 5 DAYS BEFORE ISSUING A FINAL NEGATIVE REPORT     [facility]   Report Status PENDING   Incomplete  CULTURE, BLOOD (ROUTINE X 2)     Status: None   Collection Time    07/19/13  1:05 AM      Result Value Ref Range Status   Specimen Description BLOOD RIGHT HAND   Final   Special Requests     Final   Value: BOTTLES DRAWN AEROBIC AND ANAEROBIC 10CC AER,5CC ANA   Culture  Setup Time     Final   Value: 07/19/2013 08:35     Performed at [facility]   Culture     Final   Value:        BLOOD CULTURE RECEIVED NO GROWTH TO DATE CULTURE WILL BE HELD FOR 5 DAYS BEFORE ISSUING A FINAL NEGATIVE REPORT     Performed at [facility]   Report Status PENDING   Incomplete     Studies: Dg Chest 2 View  07/19/2013   CLINICAL DATA:  SHORTNESS OF BREATH BACK PAIN  EXAM:  CHEST  2 VIEW  COMPARISON:  Prior radiograph from 02/11/2013  FINDINGS: Median sternotomy wires with underlying prostatic bowels are noted, unchanged. Pacemaker generator overlies the left abdomen, also stable. Mediastinal silhouette within normal limits.  Lungs are normally inflated. Patchy infiltrate present within the right lower lobe, suspicious for pneumonia. No other definite focal infiltrates. No pulmonary edema. Minimal blunting of the right costophrenic angle is suggestive of a small right pleural effusion. Mild elevation of the lateral right hemidiaphragm suggested possible subpulmonic component. No pneumothorax.  No acute osseus abnormality.  IMPRESSION: 1. Patchy right lower lobe infiltrate, suspicious for pneumonia. 2. Blunting of the right costophrenic angle, suggestive of associated small parapneumonic effusion.   Electronically Signed   By: Rise MuBenjamin  McClintock M.D.   On: 07/19/2013 00:39   Ct Angio Chest W/cm &/or Wo Cm  07/19/2013   CLINICAL DATA:  Shortness of breath for 4 days. Back pain between the shoulder blades. Nausea.  EXAM: CT ANGIOGRAPHY CHEST WITH CONTRAST  TECHNIQUE: Multidetector CT imaging of the chest was performed using the standard protocol during bolus administration of intravenous contrast. Multiplanar CT image reconstructions and MIPs were obtained to evaluate the vascular anatomy.  CONTRAST:  80mL OMNIPAQUE IOHEXOL 350 MG/ML SOLN  COMPARISON:  02/07/2013  FINDINGS: Technically adequate study with good opacification of the central and segmental pulmonary arteries. No focal filling defects. No evidence of significant pulmonary embolus.  Mild cardiac enlargement. Postoperative changes in the mediastinum with mitral valve replacement. Aortic valve replacement. Normal caliber thoracic aorta without evidence of dissection. Great vessel origins are patent. No significant lymphadenopathy in the chest. Esophagus is decompressed. There is suggestion of mild esophageal wall thickening  which could indicate esophagitis. Small right pleural effusion. Atelectasis in both lung bases. Patchy scarring versus consolidation in the right lung base. Airways appear patent. No pneumothorax.  Review of the MIP images confirms the above findings.  IMPRESSION: No evidence of significant pulmonary embolus. Cardiac enlargement. Atelectasis and/ infiltration in the lung bases.   Electronically Signed   By: Burman NievesWilliam  Stevens M.D.   On: 07/19/2013 04:03    Scheduled Meds: . amoxicillin  500 mg Oral TID  . aspirin EC  325 mg Oral Daily  . enoxaparin (LOVENOX) injection  40 mg Subcutaneous Q24H  . furosemide  20 mg Intravenous Q12H  . [  START ON 07/21/2013] levofloxacin  750 mg Oral Q48H  . levothyroxine  100 mcg Oral QAC breakfast  . pneumococcal 23 valent vaccine  0.5 mL Intramuscular Tomorrow-1000  . sodium chloride  3 mL Intravenous Q12H  . sodium chloride  3 mL Intravenous Q12H   Continuous Infusions:   Time spent: 35 minutes  Shelaine Frie L  [facility] Pager [phone]. If 7PM-7AM, please contact night-coverage at [email or web address], password [facility] 07/20/2013, 12:31 PM  LOS: 1 day

## 2013-07-20 NOTE — Progress Notes (Signed)
[facility]  2D Echocardiogram completed.  [name], RDCS 07/20/2013 1:30 PM

## 2013-07-20 NOTE — Progress Notes (Signed)
ANTIBIOTIC CONSULT NOTE - FOLLOW UP  Pharmacy Consult for Levaquin Indication: CAP  Assessment: 43 y/o female on day 3 Levaquin for CAP. SCr bumped up likely from diuresis. Will adjust Levaquin. She is afebrile, WBC are normal and culture data is negative to date. She is also on chronic amoxicillin therapy.  Goal of Therapy:  Eradication of infection  Plan:  - Decrease Levaquin to 750 mg PO q48h - Monitor renal function and clinical course   No Known Allergies  Patient Measurements: Height: 5\' 4"  (162.6 cm) Weight: 172 lb 4.8 oz (78.155 kg) (b scale) IBW/kg (Calculated) : 54.7  Vital Signs: Temp: 97.5 F (36.4 C) (07/08 0550) Temp src: Oral (07/08 0550) BP: 93/59 mmHg (07/08 0854) Pulse Rate: 76 (07/08 0854)  Labs:  Recent Labs  07/18/13 2351 07/19/13 0937 07/20/13 0515  WBC 5.5 4.6  --   HGB 11.8* 11.9*  --   PLT 222 208  --   CREATININE 1.23* 0.99 1.65*   Estimated Creatinine Clearance: 43.6 ml/min (by C-G formula based on Cr of 1.65). No results found for this basename: VANCOTROUGH, Leodis BinetVANCOPEAK, VANCORANDOM, GENTTROUGH, GENTPEAK, GENTRANDOM, TOBRATROUGH, TOBRAPEAK, TOBRARND, AMIKACINPEAK, AMIKACINTROU, AMIKACIN,  in the last 72 hours   Microbiology: Recent Results (from the past 720 hour(s))  CULTURE, BLOOD (ROUTINE X 2)     Status: None   Collection Time    07/19/13 12:55 AM      Result Value Ref Range Status   Specimen Description BLOOD RIGHT ANTECUBITAL   Final   Special Requests BOTTLES DRAWN AEROBIC AND ANAEROBIC 10CC EA   Final   Culture  Setup Time     Final   Value: 07/19/2013 08:35     [facility]formed [facility]   Culture     Final   Value:        BLOOD CULTURE RECEIVED NO GROWTH TO DATE CULTURE WILL BE HELD FOR 5 DAYS BEFORE ISSUING A FINAL NEGATIVE REPORT     Performed at [facility]   Report Status PENDING   Incomplete  CULTURE, BLOOD (ROUTINE X 2)     Status: None   Collection Time    07/19/13  1:05 AM      Result Value Ref  Range Status   Specimen Description BLOOD RIGHT HAND   Final   Special Requests     Final   Value: BOTTLES DRAWN AEROBIC AND ANAEROBIC 10CC AER,5CC ANA   Culture  Setup Time     Final   Value: 07/19/2013 08:35     [facility]formed at [facility]   Culture     Final   Value:        BLOOD CULTURE RECEIVED NO GROWTH TO DATE CULTURE WILL BE HELD FOR 5 DAYS BEFORE ISSUING A FINAL NEGATIVE REPORT     [facility]formed at [facility]   Report Status PENDING   Incomplete    Anti-infectives   Start     Dose/Rate Route Frequency Ordered Stop   07/21/13 0800  levofloxacin (LEVAQUIN) tablet 750 mg     750 mg Oral Every 48 hours 07/20/13 1139     07/20/13 0600  levofloxacin (LEVAQUIN) IVPB 750 mg  Status:  Discontinued     750 mg 100 mL/hr over 90 Minutes Intravenous Every 24 hours 07/19/13 0639 07/20/13 1138   07/19/13 1000  amoxicillin (AMOXIL) capsule 500 mg     500 mg Oral 3 times daily 07/19/13 0626     07/19/13 0645  levofloxacin (  LEVAQUIN) IVPB 750 mg     750 mg 100 mL/hr over 90 Minutes Intravenous  Once 07/19/13 0639 07/19/13 1247      [facility], [place].D., BCPS Clinical Pharmacist Pager: [phone] 07/20/2013 11:42 AM

## 2013-07-20 NOTE — Progress Notes (Signed)
Subjective:  Continues to endorse constant chest pressure, no worse with walking. Dominant complaint is exertional dyspnea. Approx. 1.3L net diuresis Afebrile, normal WBC.  Objective:  Temp:  [97.5 F (36.4 C)-98.2 F (36.8 C)] 97.5 F (36.4 C) (07/08 0550) Pulse Rate:  [69-80] 78 (07/08 0705) Resp:  [18-20] 19 (07/08 0550) BP: (88-100)/(53-61) 91/60 mmHg (07/08 0705) SpO2:  [98 %-100 %] 98 % (07/08 0550) Weight change:   Intake/Output from previous day: 07/07 0701 - 07/08 0700 In: 260 [P.O.:240; I.V.:20] Out: 1575 [Urine:1575]  Intake/Output from this shift:    Medications: Current Facility-Administered Medications  Medication Dose Route Frequency Provider Last Rate Last Dose  . acetaminophen (TYLENOL) tablet 650 mg  650 mg Oral Q4H PRN Gardiner Barefoot, NP       Or  . acetaminophen (TYLENOL) suppository 650 mg  650 mg Rectal Q4H PRN Gardiner Barefoot, NP      . alum & mag hydroxide-simeth (MAALOX/MYLANTA) 200-200-20 MG/5ML suspension 30 mL  30 mL Oral Q6H PRN Annita Brod, MD   30 mL at 07/19/13 1824  . amoxicillin (AMOXIL) capsule 500 mg  500 mg Oral TID Rise Patience, MD   500 mg at 07/19/13 2215  . aspirin EC tablet 325 mg  325 mg Oral Daily Rise Patience, MD   325 mg at 07/19/13 1007  . enoxaparin (LOVENOX) injection 40 mg  40 mg Subcutaneous Q24H Rise Patience, MD   40 mg at 07/19/13 1007  . furosemide (LASIX) injection 20 mg  20 mg Intravenous Q12H Annita Brod, MD   20 mg at 07/19/13 2215  . levofloxacin (LEVAQUIN) IVPB 750 mg  750 mg Intravenous Q24H Rogue Bussing, RPH   750 mg at 07/20/13 6767  . levothyroxine (SYNTHROID, LEVOTHROID) tablet 100 mcg  100 mcg Oral QAC breakfast Rise Patience, MD   100 mcg at 07/20/13 [phone]  . morphine 2 MG/ML injection 1 mg  1 mg Intravenous Q4H PRN Rise Patience, MD   1 mg at 07/20/13 0853  . ondansetron (ZOFRAN) tablet 4 mg  4 mg Oral Q6H PRN Rise Patience, MD       Or   . ondansetron [facility]) injection 4 mg  4 mg Intravenous Q6H PRN Rise Patience, MD      . pneumococcal 23 valent vaccine (PNU-IMMUNE) injection 0.5 mL  0.5 mL Intramuscular Tomorrow-1000 Annita Brod, MD      . sodium chloride 0.9 % injection 3 mL  3 mL Intravenous Q12H Rise Patience, MD   3 mL at 07/19/13 2215  . sodium chloride 0.9 % injection 3 mL  3 mL Intravenous Q12H Rise Patience, MD   3 mL at 07/19/13 1945    Physical Exam: General appearance: alert, cooperative and no distress Neck: no adenopathy, no carotid bruit, no JVD, supple, symmetrical, trachea midline and thyroid not enlarged, symmetric, no tenderness/mass/nodules Lungs: clear to auscultation bilaterally Heart: regular rate and rhythm, S1: normal, S2: increased intensity and systolic murmur: holosystolic 2/6, high pitch at lower left sternal border Abdomen: soft, non-tender; bowel sounds normal; no masses,  no organomegaly Extremities: extremities normal, atraumatic, no cyanosis or edema Pulses: 2+ and symmetric Skin: Skin color, texture, turgor normal. No rashes or lesions Neurologic: Grossly normal  Lab Results: Results for orders placed during the hospital encounter of 07/19/13 (from the past 48 hour(s))  CBC     Status: Abnormal   Collection Time    07/18/13  11:51 PM      Result Value Ref Range   WBC 5.5  4.0 - 10.5 K/uL   RBC 4.28  3.87 - 5.11 MIL/uL   Hemoglobin 11.8 (*) 12.0 - 15.0 g/dL   HCT 36.7  36.0 - 46.0 %   MCV 85.7  78.0 - 100.0 fL   MCH 27.6  26.0 - 34.0 pg   MCHC 32.2  30.0 - 36.0 g/dL   RDW 16.9 (*) 11.5 - 15.5 %   Platelets 222  150 - 400 K/uL  BASIC METABOLIC PANEL     Status: Abnormal   Collection Time    07/18/13 11:51 PM      Result Value Ref Range   Sodium 140  137 - 147 mEq/L   Potassium 4.5  3.7 - 5.3 mEq/L   Chloride 101  96 - 112 mEq/L   CO2 26  19 - 32 mEq/L   Glucose, Bld 87  70 - 99 mg/dL   BUN 16  6 - 23 mg/dL   Creatinine, Ser 1.23 (*) 0.50 - 1.10  mg/dL   Calcium 9.5  8.4 - 10.5 mg/dL   GFR calc non Af Amer 52 (*) >90 mL/min   GFR calc Af Amer 60 (*) >90 mL/min   Comment: (NOTE)     The eGFR has been calculated using the CKD EPI equation.     This calculation has not been validated in all clinical situations.     eGFR's persistently <90 mL/min signify possible Chronic Kidney     Disease.   Anion gap 13  5 - 15  PRO B NATRIURETIC PEPTIDE     Status: Abnormal   Collection Time    07/18/13 11:51 PM      Result Value Ref Range   Pro B Natriuretic peptide (BNP) 2764.0 (*) 0 - 125 pg/mL  TROPONIN I     Status: Abnormal   Collection Time    07/19/13 12:20 AM      Result Value Ref Range   Troponin I 0.34 (*) <0.30 ng/mL   Comment:            Due to the release kinetics of cTnI,     a negative result within the first hours     of the onset of symptoms does not rule out     myocardial infarction with certainty.     If myocardial infarction is still suspected,     repeat the test at appropriate intervals.     CRITICAL RESULT CALLED TO, READ BACK BY AND VERIFIED WITH:     J.LEBRON RN 0125 07/19/13 E.Zimmerman, ED     Status: Abnormal   Collection Time    07/19/13 12:26 AM      Result Value Ref Range   Troponin i, poc 0.14 (*) 0.00 - 0.08 ng/mL   Comment NOTIFIED PHYSICIAN     Comment 3            Comment: Due to the release kinetics of cTnI,     a negative result within the first hours     of the onset of symptoms does not rule out     myocardial infarction with certainty.     If myocardial infarction is still suspected,     repeat the test at appropriate intervals.  SEDIMENTATION RATE     Status: None   Collection Time    07/19/13 12:40 AM      Result Value Ref Range  Sed Rate 14  0 - 22 mm/hr  CULTURE, BLOOD (ROUTINE X 2)     Status: None   Collection Time    07/19/13 12:55 AM      Result Value Ref Range   Specimen Description BLOOD RIGHT ANTECUBITAL     Special Requests BOTTLES DRAWN AEROBIC AND  ANAEROBIC 10CC EA     Culture  Setup Time       Value: 07/19/2013 08:35     Performed at [facility]   Culture       Value:        BLOOD CULTURE RECEIVED NO GROWTH TO DATE CULTURE WILL BE HELD FOR 5 DAYS BEFORE ISSUING A FINAL NEGATIVE REPORT     Performed at [facility]   Report Status PENDING    CULTURE, BLOOD (ROUTINE X 2)     Status: None   Collection Time    07/19/13  1:05 AM      Result Value Ref Range   Specimen Description BLOOD RIGHT HAND     Special Requests       Value: BOTTLES DRAWN AEROBIC AND ANAEROBIC 10CC AER,5CC ANA   Culture  Setup Time       Value: 07/19/2013 08:35     Performed at [facility]   Culture       Value:        BLOOD CULTURE RECEIVED NO GROWTH TO DATE CULTURE WILL BE HELD FOR 5 DAYS BEFORE ISSUING A FINAL NEGATIVE REPORT     Performed at [facility]   Report Status PENDING    URINALYSIS, ROUTINE W REFLEX MICROSCOPIC     Status: Abnormal   Collection Time    07/19/13  1:46 AM      Result Value Ref Range   Color, Urine YELLOW  YELLOW   APPearance CLOUDY (*) CLEAR   Specific Gravity, Urine 1.015  1.005 - 1.030   pH 6.5  5.0 - 8.0   Glucose, UA NEGATIVE  NEGATIVE mg/dL   Hgb urine dipstick NEGATIVE  NEGATIVE   Bilirubin Urine NEGATIVE  NEGATIVE   Ketones, ur NEGATIVE  NEGATIVE mg/dL   Protein, ur NEGATIVE  NEGATIVE mg/dL   Urobilinogen, UA 0.2  0.0 - 1.0 mg/dL   Nitrite NEGATIVE  NEGATIVE   Leukocytes, UA MODERATE (*) NEGATIVE  URINE RAPID DRUG SCREEN (HOSP PERFORMED)     Status: Abnormal   Collection Time    07/19/13  1:46 AM      Result Value Ref Range   Opiates NONE DETECTED  NONE DETECTED   Cocaine NONE DETECTED  NONE DETECTED   Benzodiazepines POSITIVE (*) NONE DETECTED   Amphetamines NONE DETECTED  NONE DETECTED   Tetrahydrocannabinol POSITIVE (*) NONE DETECTED   Barbiturates NONE DETECTED  NONE DETECTED   Comment:            DRUG SCREEN FOR MEDICAL PURPOSES     ONLY.  IF CONFIRMATION IS NEEDED      FOR ANY PURPOSE, NOTIFY LAB     WITHIN 5 DAYS.                LOWEST DETECTABLE LIMITS     FOR URINE DRUG SCREEN     Drug Class       Cutoff (ng/mL)     Amphetamine      1000     Barbiturate      200     Benzodiazepine   200  Tricyclics       767     Opiates          300     Cocaine          300     THC              50  URINE MICROSCOPIC-ADD ON     Status: None   Collection Time    07/19/13  1:46 AM      Result Value Ref Range   Squamous Epithelial / LPF RARE  RARE   WBC, UA 11-20  <3 WBC/hpf   RBC / HPF 0-2  <3 RBC/hpf   Bacteria, UA RARE  RARE   Urine-Other TRICHOMONAS PRESENT    PREGNANCY, URINE     Status: None   Collection Time    07/19/13  2:35 AM      Result Value Ref Range   Preg Test, Ur NEGATIVE  NEGATIVE   Comment:            THE SENSITIVITY OF THIS     METHODOLOGY IS >20 mIU/mL.  PROCALCITONIN     Status: None   Collection Time    07/19/13  4:56 AM      Result Value Ref Range   Procalcitonin <0.10     Comment:            Interpretation:     PCT (Procalcitonin) <= 0.5 ng/mL:     Systemic infection (sepsis) is not likely.     Local bacterial infection is possible.     REPEATED TO VERIFY     (NOTE)             ICU PCT Algorithm               Non ICU PCT Algorithm        ----------------------------     ------------------------------             PCT < 0.25 ng/mL                 PCT < 0.1 ng/mL         Stopping of antibiotics            Stopping of antibiotics           strongly encouraged.               strongly encouraged.        ----------------------------     ------------------------------           PCT level decrease by               PCT < 0.25 ng/mL           >= 80% from peak PCT           OR PCT 0.25 - 0.5 ng/mL          Stopping of antibiotics                                                 encouraged.         Stopping of antibiotics               encouraged.        ----------------------------     ------------------------------  PCT  level decrease by              PCT >= 0.25 ng/mL           < 80% from peak PCT            AND PCT >= 0.5 ng/mL            Continuing antibiotics                                                  encouraged.           Continuing antibiotics                encouraged.        ----------------------------     ------------------------------         PCT level increase compared          PCT > 0.5 ng/mL             with peak PCT AND              PCT >= 0.5 ng/mL             Escalation of antibiotics                                              strongly encouraged.          Escalation of antibiotics            strongly encouraged.  COMPREHENSIVE METABOLIC PANEL     Status: Abnormal   Collection Time    07/19/13  9:37 AM      Result Value Ref Range   Sodium 137  137 - 147 mEq/L   Potassium 4.7  3.7 - 5.3 mEq/L   Chloride 101  96 - 112 mEq/L   CO2 20  19 - 32 mEq/L   Glucose, Bld 82  70 - 99 mg/dL   BUN 17  6 - 23 mg/dL   Creatinine, Ser 0.99  0.50 - 1.10 mg/dL   Calcium 8.8  8.4 - 10.5 mg/dL   Total Protein 7.8  6.0 - 8.3 g/dL   Albumin 3.8  3.5 - 5.2 g/dL   AST 22  0 - 37 U/L   ALT 17  0 - 35 U/L   Alkaline Phosphatase 97  39 - 117 U/L   Total Bilirubin 0.4  0.3 - 1.2 mg/dL   GFR calc non Af Amer 68 (*) >90 mL/min   GFR calc Af Amer 79 (*) >90 mL/min   Comment: (NOTE)     The eGFR has been calculated using the CKD EPI equation.     This calculation has not been validated in all clinical situations.     eGFR's persistently <90 mL/min signify possible Chronic Kidney     Disease.   Anion gap 16 (*) 5 - 15  CBC WITH DIFFERENTIAL     Status: Abnormal   Collection Time    07/19/13  9:37 AM      Result Value Ref Range   WBC 4.6  4.0 - 10.5 K/uL   RBC 4.40  3.87 - 5.11 MIL/uL   Hemoglobin 11.9 (*) 12.0 - 15.0 g/dL  HCT 37.1  36.0 - 46.0 %   MCV 84.3  78.0 - 100.0 fL   MCH 27.0  26.0 - 34.0 pg   MCHC 32.1  30.0 - 36.0 g/dL   RDW 95.9 (*) 36.2 - 76.5 %   Platelets 208  150 - 400 K/uL     Neutrophils Relative % 53  43 - 77 %   Neutro Abs 2.4  1.7 - 7.7 K/uL   Lymphocytes Relative 32  12 - 46 %   Lymphs Abs 1.5  0.7 - 4.0 K/uL   Monocytes Relative 9  3 - 12 %   Monocytes Absolute 0.4  0.1 - 1.0 K/uL   Eosinophils Relative 5  0 - 5 %   Eosinophils Absolute 0.3  0.0 - 0.7 K/uL   Basophils Relative 1  0 - 1 %   Basophils Absolute 0.1  0.0 - 0.1 K/uL  TROPONIN I     Status: None   Collection Time    07/19/13 11:58 AM      Result Value Ref Range   Troponin I <0.30  <0.30 ng/mL   Comment:            Due to the release kinetics of cTnI,     a negative result within the first hours     of the onset of symptoms does not rule out     myocardial infarction with certainty.     If myocardial infarction is still suspected,     repeat the test at appropriate intervals.  TROPONIN I     Status: None   Collection Time    07/19/13  6:30 PM      Result Value Ref Range   Troponin I <0.30  <0.30 ng/mL   Comment:            Due to the release kinetics of cTnI,     a negative result within the first hours     of the onset of symptoms does not rule out     myocardial infarction with certainty.     If myocardial infarction is still suspected,     repeat the test at appropriate intervals.  BASIC METABOLIC PANEL     Status: Abnormal   Collection Time    07/20/13  5:15 AM      Result Value Ref Range   Sodium 136 (*) 137 - 147 mEq/L   Potassium 4.9  3.7 - 5.3 mEq/L   Chloride 96  96 - 112 mEq/L   CO2 27  19 - 32 mEq/L   Glucose, Bld 103 (*) 70 - 99 mg/dL   BUN 26 (*) 6 - 23 mg/dL   Creatinine, Ser 5.87 (*) 0.50 - 1.10 mg/dL   Comment: DELTA CHECK NOTED   Calcium 9.2  8.4 - 10.5 mg/dL   GFR calc non Af Amer 37 (*) >90 mL/min   GFR calc Af Amer 42 (*) >90 mL/min   Comment: (NOTE)     The eGFR has been calculated using the CKD EPI equation.     This calculation has not been validated in all clinical situations.     eGFR's persistently <90 mL/min signify possible Chronic Kidney      Disease.   Anion gap 13  5 - 15    Imaging: Imaging results have been reviewed and Dg Chest 2 View  07/19/2013   CLINICAL DATA:  SHORTNESS OF BREATH BACK PAIN  EXAM: CHEST  2 VIEW  COMPARISON:  Prior  radiograph from 02/11/2013  FINDINGS: Median sternotomy wires with underlying prostatic bowels are noted, unchanged. Pacemaker generator overlies the left abdomen, also stable. Mediastinal silhouette within normal limits.  Lungs are normally inflated. Patchy infiltrate present within the right lower lobe, suspicious for pneumonia. No other definite focal infiltrates. No pulmonary edema. Minimal blunting of the right costophrenic angle is suggestive of a small right pleural effusion. Mild elevation of the lateral right hemidiaphragm suggested possible subpulmonic component. No pneumothorax.  No acute osseus abnormality.  IMPRESSION: 1. Patchy right lower lobe infiltrate, suspicious for pneumonia. 2. Blunting of the right costophrenic angle, suggestive of associated small parapneumonic effusion.   Electronically Signed   By: Jeannine Boga M.D.   On: 07/19/2013 00:39   Ct Angio Chest W/cm &/or Wo Cm  07/19/2013   CLINICAL DATA:  Shortness of breath for 4 days. Back pain between the shoulder blades. Nausea.  EXAM: CT ANGIOGRAPHY CHEST WITH CONTRAST  TECHNIQUE: Multidetector CT imaging of the chest was performed using the standard protocol during bolus administration of intravenous contrast. Multiplanar CT image reconstructions and MIPs were obtained to evaluate the vascular anatomy.  CONTRAST:  73mL OMNIPAQUE IOHEXOL 350 MG/ML SOLN  COMPARISON:  02/07/2013  FINDINGS: Technically adequate study with good opacification of the central and segmental pulmonary arteries. No focal filling defects. No evidence of significant pulmonary embolus.  Mild cardiac enlargement. Postoperative changes in the mediastinum with mitral valve replacement. Aortic valve replacement. Normal caliber thoracic aorta without evidence of  dissection. Great vessel origins are patent. No significant lymphadenopathy in the chest. Esophagus is decompressed. There is suggestion of mild esophageal wall thickening which could indicate esophagitis. Small right pleural effusion. Atelectasis in both lung bases. Patchy scarring versus consolidation in the right lung base. Airways appear patent. No pneumothorax.  Review of the MIP images confirms the above findings.  IMPRESSION: No evidence of significant pulmonary embolus. Cardiac enlargement. Atelectasis and/ infiltration in the lung bases.   Electronically Signed   By: Lucienne Capers M.D.   On: 07/19/2013 04:03    Assessment:  1. Principal Problem: 2.   Shortness of breath 3. Active Problems: 4.   Hypothyroidism 5.   Substance abuse 6.   Acute diastolic CHF (congestive heart failure), NYHA class 3 7.   Anemia 8.   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   Hepatitis C 10.   Dyspnea 11.   Plan:  1. Doubt recurrent acute endocarditis and don't think a TEE is necessary. 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 Check 2D echo - dominant problem appears to be CHF 3. Continue diuretics  Time Spent Directly with Patient:  30 minutes  Length of Stay:  LOS: 1 day    Brileigh Sevcik 07/20/2013, 9:07 AM

## 2013-07-21 ENCOUNTER — Encounter (HOSPITAL_COMMUNITY)
Admission: EM | Disposition: A | Discharge status: Hospice | Payer: Self-pay | Source: Home / Self Care | Attending: Internal Medicine

## 2013-07-21 DIAGNOSIS — I5021 Acute systolic (congestive) heart failure: Secondary | ICD-10-CM

## 2013-07-21 DIAGNOSIS — I509 Heart failure, unspecified: Secondary | ICD-10-CM

## 2013-07-21 HISTORY — PX: LEFT AND RIGHT HEART CATHETERIZATION WITH CORONARY ANGIOGRAM: SHX5449

## 2013-07-21 LAB — POCT ACTIVATED CLOTTING TIME: ACTIVATED CLOTTING TIME: 174 s

## 2013-07-21 LAB — URINE CULTURE: Colony Count: 60000

## 2013-07-21 LAB — BASIC METABOLIC PANEL
Anion gap: 18 — ABNORMAL HIGH (ref 5–15)
BUN: 23 mg/dL (ref 6–23)
CALCIUM: 9.8 mg/dL (ref 8.4–10.5)
CO2: 23 mEq/L (ref 19–32)
Chloride: 96 mEq/L (ref 96–112)
Creatinine, Ser: 1.15 mg/dL — ABNORMAL HIGH (ref 0.50–1.10)
GFR, EST AFRICAN AMERICAN: 66 mL/min — AB (ref >90)
GFR, EST NON AFRICAN AMERICAN: 57 mL/min — AB (ref >90)
Glucose, Bld: 125 mg/dL — ABNORMAL HIGH (ref 70–99)
Potassium: 4.4 mEq/L (ref 3.7–5.3)
SODIUM: 137 meq/L (ref 137–147)

## 2013-07-21 SURGERY — LEFT AND RIGHT HEART CATHETERIZATION WITH CORONARY ANGIOGRAM
Anesthesia: LOCAL

## 2013-07-21 MED ORDER — HEPARIN (PORCINE) IN NACL 2-0.9 UNIT/ML-% IJ SOLN
INTRAMUSCULAR | Status: AC
  Filled 2013-07-21: qty 1000

## 2013-07-21 MED ORDER — LEVOTHYROXINE SODIUM 112 MCG PO TABS
112.0000 ug | ORAL_TABLET | Freq: Every day | ORAL | Status: DC
  Administered 2013-07-22 – 2013-07-26 (×5): 112 ug via ORAL
  Filled 2013-07-21 (×6): qty 1

## 2013-07-21 MED ORDER — SODIUM CHLORIDE 0.9 % IJ SOLN: 3.0000 mL | INTRAMUSCULAR | Status: DC | PRN

## 2013-07-21 MED ORDER — ASPIRIN 81 MG PO CHEW
81.0000 mg | CHEWABLE_TABLET | ORAL | Status: AC
  Administered 2013-07-21: 81 mg via ORAL
  Filled 2013-07-21: qty 1

## 2013-07-21 MED ORDER — SODIUM CHLORIDE 0.9 % IV SOLN: 1.0000 mL/kg/h | INTRAVENOUS | Status: DC

## 2013-07-21 MED ORDER — SODIUM CHLORIDE 0.9 % IV SOLN
INTRAVENOUS | Status: AC
  Administered 2013-07-21: 20:00:00 via INTRAVENOUS

## 2013-07-21 MED ORDER — LIDOCAINE HCL (PF) 1 % IJ SOLN
INTRAMUSCULAR | Status: AC
  Filled 2013-07-21: qty 30

## 2013-07-21 MED ORDER — SODIUM CHLORIDE 0.9 % IJ SOLN
3.0000 mL | Freq: Two times a day (BID) | INTRAMUSCULAR | Status: DC
Start: 2013-07-21 — End: 2013-07-21

## 2013-07-21 MED ORDER — MORPHINE SULFATE 2 MG/ML IJ SOLN
2.0000 mg | Freq: Once | INTRAMUSCULAR | Status: AC
  Administered 2013-07-21: 2 mg via INTRAVENOUS
  Filled 2013-07-21: qty 1

## 2013-07-21 MED ORDER — HEPARIN SODIUM (PORCINE) 1000 UNIT/ML IJ SOLN
INTRAMUSCULAR | Status: AC
  Filled 2013-07-21: qty 1

## 2013-07-21 MED ORDER — VERAPAMIL HCL 2.5 MG/ML IV SOLN
INTRAVENOUS | Status: AC
  Filled 2013-07-21: qty 2

## 2013-07-21 MED ORDER — SODIUM CHLORIDE 0.9 % IV SOLN: 250.0000 mL | INTRAVENOUS | Status: DC | PRN

## 2013-07-21 MED ORDER — MIDAZOLAM HCL 2 MG/2ML IJ SOLN
INTRAMUSCULAR | Status: AC
  Filled 2013-07-21: qty 2

## 2013-07-21 NOTE — Progress Notes (Signed)
[facility] PROGRESS NOTE  TARRA PENCE ZOX:[id number] DOB: [birth year] DOA: 07/19/2013 PCP: Doris Cheadle, MD  Assessment/Plan:  Principal Problem:   Acute systolic CHF. EF worse (30-35% with new global hypokinesis and inferoseptal akinesis). Creatinine better. For cath today Active Problems: Acute kidney injury: improved   Hypothyroidism: TSH high. Will increase synthroid to 112 mcg   Substance abuse: denies current. Known to me from previous at which time she asked for Rx for methadone or percocet. 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  Objective: Filed Vitals:   07/21/13 0457  BP: 103/64  Pulse: 70  Temp: 97.8 F (36.6 C)  Resp: 18    Intake/Output Summary (Last 24 hours) at 07/21/13 1123 Last data filed at 07/21/13 0912  Gross per 24 hour  Intake    720 ml  Output   1650 ml  Net   -930 ml   Filed Weights   07/19/13 0623 07/21/13 0457  Weight: 78.155 kg (172 lb 4.8 oz) 74.9 kg (165 lb 2 oz)    Exam:   General:  In dark. Appears comfortable lying nearly flat  Cardiovascular: RRR murmur present  Respiratory: CTA without WRR  Abdomen: s, nt, nd  Ext: minimal edema  Basic Metabolic Panel:  Recent Labs Lab 07/18/13 2351 07/19/13 0937 07/20/13 0515 07/21/13 0903  NA 140 137 136* 137  K 4.5 4.7 4.9 4.4  CL 101 101 96 96  CO2 26 20 27 23   GLUCOSE 87 82 103* 125*  BUN 16 17 26* 23  CREATININE 1.23* 0.99 1.65* 1.15*  CALCIUM 9.5 8.8 9.2 9.8   Liver Function Tests:  Recent Labs Lab 07/19/13 0937  AST 22  ALT 17  ALKPHOS 97  BILITOT 0.4  PROT 7.8  ALBUMIN 3.8   No results found for this basename: LIPASE, AMYLASE,  in the last 168 hours No  results found for this basename: AMMONIA,  in the last 168 hours CBC:  Recent Labs Lab 07/18/13 2351 07/19/13 0937  WBC 5.5 4.6  NEUTROABS  --  2.4  HGB 11.8* 11.9*  HCT 36.7 37.1  MCV 85.7 84.3  PLT 222 208   Cardiac Enzymes:  Recent Labs Lab 07/19/13 0020 07/19/13 1158 07/19/13 1830  TROPONINI 0.34* <0.30 <0.30   BNP (last 3 results)  Recent Labs  09/25/12 0936 02/06/13 1951 07/18/13 2351  PROBNP 6304.0* 1450.0* 2764.0*   CBG: No results found for this basename: GLUCAP,  in the last 168 hours  Recent Results (from the past 240 hour(s))  CULTURE, BLOOD (ROUTINE X 2)     Status: None   Collection Time    07/19/13 12:55 AM      Result Value Ref Range Status   Specimen Description BLOOD RIGHT ANTECUBITAL   Final   Special Requests BOTTLES DRAWN AEROBIC AND ANAEROBIC 10CC EA   Final   Culture  Setup Time     Final   Value: 07/19/2013 08:35     Performed at [facility]   Culture     Final   Value:        BLOOD CULTURE RECEIVED NO GROWTH TO DATE CULTURE WILL BE HELD FOR 5 DAYS BEFORE ISSUING  A FINAL NEGATIVE REPORT     [facility]   Report Status PENDING   Incomplete  CULTURE, BLOOD (ROUTINE X 2)     Status: None   Collection Time    07/19/13  1:05 AM      Result Value Ref Range Status   Specimen Description BLOOD RIGHT HAND   Final   Special Requests     Final   Value: BOTTLES DRAWN AEROBIC AND ANAEROBIC 10CC AER,5CC ANA   Culture  Setup Time     Final   Value: 07/19/2013 08:35     [facility]   Culture     Final   Value:        BLOOD CULTURE RECEIVED NO GROWTH TO DATE CULTURE WILL BE HELD FOR 5 DAYS BEFORE ISSUING A FINAL NEGATIVE REPORT     [facility]   Report Status PENDING   Incomplete  URINE CULTURE     Status: None   Collection Time    07/19/13  1:46 AM      Result Value Ref Range Status   Specimen Description URINE, CLEAN CATCH   Final   Special Requests NONE   Final    Culture  Setup Time     Final   Value: 07/19/2013 02:14     [facility]   Colony Count     Final   Value: 60,000 COLONIES/ML     [facility]   Culture     Final   Value: ESCHERICHIA COLI     [facility]   Report Status 07/21/2013 FINAL   Final   Organism ID, Bacteria ESCHERICHIA COLI   Final     Studies: No results found. Echo  Left ventricle: The cavity size was normal. Wall thickness was normal. Systolic function was moderately to severely reduced. The estimated ejection fraction was in the range of 30% to 35%. Incoordinate septal motion. The study is not technically sufficient to allow evaluation of LV diastolic function. - Aortic valve: Bioprosthetic aortic valve. Small septally located paravalvular leak. Peak and mean gradients of 28 and 16 mmHg, respectively. Normal jet contour, AT <100 msec, no suggestion of obstruction. There was trivial regurgitation. - Mitral valve: Bioprosthetic mitral valve, well-seated. Trivial regurgitation. No obstruction. - Right ventricle: The cavity size was normal. RV systolic pressure (S, est): 44 mm Hg. - Tricuspid valve: Bioprosthetic valve. There is sinusodal flow in the tricuspid valve. There was moderate regurgitation. - Pulmonary arteries: PA peak pressure: 44 mm Hg (S). - Inferior vena cava: The vessel was dilated. The respirophasic diameter changes were blunted (< 50%), consistent with elevated central venous pressure.  Impressions:  - Compared to her prior echo in 04/2012, there is a new cardiomyopathy with global hypokinesis and inferoseptal akinesis. LVEF of 30-35%. Her bioprosthetic mitral, aortic and tricuspid valves appear stable - there is persistent tricuspid leaflet dysfunction with sinusoidal flow across the valve.  Scheduled Meds: . amoxicillin  500 mg Oral TID  . aspirin  81 mg Oral Pre-Cath  . aspirin EC  81 mg Oral Daily  . enoxaparin (LOVENOX)  injection  40 mg Subcutaneous Q24H  . levothyroxine  100 mcg Oral QAC breakfast  . pneumococcal 23 valent vaccine  0.5 mL Intramuscular Tomorrow-1000  . sodium chloride  3 mL Intravenous Q12H  . sodium chloride  3 mL Intravenous Q12H  . sodium chloride  3 mL Intravenous  Q12H   Continuous Infusions: . sodium chloride      Time spent: 25 minutes  Sevan Mcbroom L  [facility] Pager [phone]. If 7PM-7AM, please contact night-coverage at [email or web address], password [facility] 07/21/2013, 11:23 AM  LOS: 2 days

## 2013-07-21 NOTE — Interval H&P Note (Signed)
History and Physical Interval Note:  07/21/2013 4:44 PM  [name]  has presented today for surgery, with the diagnosis of cp  The various methods of treatment have been discussed with the patient and family. After consideration of risks, benefits and other options for treatment, the patient has consented to  Procedure(s): LEFT AND RIGHT HEART CATHETERIZATION WITH CORONARY ANGIOGRAM (N/A) as a surgical intervention .  The patient's history has been reviewed, patient examined, no change in status, stable for surgery.  I have reviewed the patient's chart and labs.  Questions were answered to the patient's satisfaction.    Cath Lab Visit (complete for each Cath Lab visit)  Clinical Evaluation Leading to the Procedure:   ACS: No.  Non-ACS:    Anginal Classification: No Symptoms  Anti-ischemic medical therapy: No Therapy  Non-Invasive Test Results: No non-invasive testing performed  Prior CABG: No previous CABG        MCALHANY,CHRISTOPHER

## 2013-07-21 NOTE — CV Procedure (Signed)
      Cardiac Catheterization Operative Report  DONTIA ZORTMAN [id number] 7/9/20154:52 PM Doris Cheadle, MD  Procedure Performed:  1. Selective Coronary Angiography 2. Measurement of right atrial pressure  Operator: Verne Carrow, MD  Indication:  43 yo female with history of endocarditis, IV drug abuse admitted with CHF, found to have reduced LVEF. Cardia cath to exclude CAD and assess RA pressures with likely severe TR. She has bioprosthetic tricuspid valve, bioprosthetic mitral valve and bioprosthetic aortic valve replacements.                                 Procedure Details: The risks, benefits, complications, treatment options, and expected outcomes were discussed with the patient. The patient and/or family concurred with the proposed plan, giving informed consent. The patient was brought to the cath lab after IV hydration was begun and oral premedication was given. The patient was further sedated with Versed and Fentanyl. The plan was for a right and left heart cath. No antecubital IV was present in the right arm. Attempts by nursing to place antecubital IV access unsuccessful. The right groin was prepped and draped in the usual manner. Using the modified Seldinger access technique, a 6 French sheath was placed in the right femoral artery. A multi-purpose catheter was used to measure RA pressures. I did not cross the bioprosthetic tricuspid valve. The right wrist was prepped and draped in sterile fashion. Reverse Allens test was positive. A 5/6 French slender sheath was inserted into the right radial artery. 3 mg Verapamil was given through the sheath. 4000 Units IV heparin was given. Standard diagnostic catheters were used to perform selective coronary angiography. The RCA was engaged with a JR4 catheter. I could not engage the left coronary system. Root angiography performed with diagnostic catheter demonstrated appearance of large thoracic aortic aneurysm. The bioprosthetic aortic  valve was not crossed during the procedure. A Terumo hemostasis band was applied to the right wrist arteriotomy site. There were no immediate complications. The patient was taken to the recovery area in stable condition.   Hemodynamic Findings: Ao:  82/57          RA:  7/12 (9). Large s waves c/w tricuspid regurgitation.  Angiographic Findings:  The coronary system could not be engaged due to dilatation of the aortic root.   Right coronary Artery: Moderate caliber vessel with diffuse 20% stenosis in the proximal, mid and distal vessel.   Impression: 1. 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 Unable to engage left coronary system 3. 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 Will discuss with DR. Croituro in am. Will consider Coronary CTA/aortic CTA to better assess aorta and assess patency of left coronary system.        Complications:  None; patient tolerated the procedure well.

## 2013-07-21 NOTE — Progress Notes (Signed)
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 Breathing is better. Echo shows a marked drop in LVEF compared to previous assessments: EF 30-35% rather than 55% in past.   CURRENT MEDS . amoxicillin  500 mg Oral TID  . aspirin EC  81 mg Oral Daily  . enoxaparin (LOVENOX) injection  40 mg Subcutaneous Q24H  . furosemide  20 mg Intravenous Daily  . levothyroxine  100 mcg Oral QAC breakfast  . pneumococcal 23 valent vaccine  0.5 mL Intramuscular Tomorrow-1000  . sodium chloride  3 mL Intravenous Q12H  . sodium chloride  3 mL Intravenous Q12H    OBJECTIVE   Intake/Output Summary (Last 24 hours) at 07/21/13 0805 Last data filed at 07/21/13 0721  Gross per 24 hour  Intake    720 ml  Output   1750 ml  Net  -1030 ml   Filed Weights   07/19/13 0623 07/21/13 0457  Weight: 172 lb 4.8 oz (78.155 kg) 165 lb 2 oz (74.9 kg)    PHYSICAL EXAM Filed Vitals:   07/20/13 0854 07/20/13 1531 07/20/13 2100 07/21/13 0457  BP: 93/59 90/53 96/60 103/64  Pulse: 76 76 78 70  Temp:  97.9 F (36.6 C) 98.1 F (36.7 C) 97.8 F (36.6 C)  TempSrc:  Oral Oral Oral  Resp:  20 16 18  Height:      Weight:    165 lb 2 oz (74.9 kg)  SpO2:  100% 100% 99%   General appearance: alert, cooperative and no distress  Neck: no adenopathy, no carotid bruit, no JVD, supple, symmetrical, trachea midline and thyroid not enlarged, symmetric, no tenderness/mass/nodules  Lungs: clear to auscultation bilaterally  Heart: regular rate and rhythm, S1: normal, S2: increased intensity and systolic  murmur: holosystolic 2/6, high pitch at lower left sternal border  Abdomen: soft, non-tender; bowel sounds normal; no masses, no organomegaly  Extremities: extremities normal, atraumatic, no cyanosis or edema  Pulses: 2+ and symmetric  Skin: Skin color, texture, turgor normal. No rashes or lesions  Neurologic: Grossly normal   LABS  CBC  Recent Labs  07/18/13 2351 07/19/13 0937  WBC 5.5 4.6  NEUTROABS  --  2.4  HGB 11.8* 11.9*  HCT 36.7 37.1  MCV 85.7 84.3  PLT 222 208   Basic Metabolic Panel  Recent Labs  07/19/13 0937 07/20/13 0515  NA 137 136*  K 4.7 4.9  CL 101 96  CO2 20 27  GLUCOSE 82 103*  BUN 17 26*  CREATININE 0.99 1.65*  CALCIUM 8.8 9.2   Liver Function Tests  Recent Labs  07/19/13 0937  AST 22  ALT 17  ALKPHOS 97  BILITOT 0.4  PROT 7.8  ALBUMIN 3.8   No results found for this basename: LIPASE, AMYLASE,  in the last 72 hours Cardiac Enzymes  Recent Labs  07/19/13 0020 07/19/13 1158 07/19/13 1830  TROPONINI 0.34* <0.30 <0.30   Thyroid Function Tests  Recent Labs  07/20/13 1340  TSH 8.820*    Radiology Studies Imaging results have been reviewed   and No results found.  TELE A sense V paced  ECG NSR, CHB, V paced 100%  ASSESSMENT AND PLAN Marked new reduction in LV systolic function associated with chest pain and amarginal abnormality in cTropI on arrival. Recommend coronary angio and R/L heart cath. Cannot locate any record of coronary angio before either one of her previous valvular surgeries at [facility]. Will repeat labs - elevated creat from diuresis may prohibit contrast based procedure today.   Thurmon Fair, MD, [facility] [phone] office [phone] pager 07/21/2013 8:05 AM

## 2013-07-21 NOTE — H&P (View-Only) (Signed)
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 Breathing is better. Echo shows a marked drop in LVEF compared to previous assessments: EF 30-35% rather than 55% in past.   CURRENT MEDS . amoxicillin  500 mg Oral TID  . aspirin EC  81 mg Oral Daily  . enoxaparin (LOVENOX) injection  40 mg Subcutaneous Q24H  . furosemide  20 mg Intravenous Daily  . levothyroxine  100 mcg Oral QAC breakfast  . pneumococcal 23 valent vaccine  0.5 mL Intramuscular Tomorrow-1000  . sodium chloride  3 mL Intravenous Q12H  . sodium chloride  3 mL Intravenous Q12H    OBJECTIVE   Intake/Output Summary (Last 24 hours) at 07/21/13 0805 Last data filed at 07/21/13 0721  Gross per 24 hour  Intake    720 ml  Output   1750 ml  Net  -1030 ml   Filed Weights   07/19/13 0623 07/21/13 0457  Weight: 172 lb 4.8 oz (78.155 kg) 165 lb 2 oz (74.9 kg)    PHYSICAL EXAM Filed Vitals:   07/20/13 0854 07/20/13 1531 07/20/13 2100 07/21/13 0457  BP: 93/59 90/53 96/60  103/64  Pulse: 76 76 78 70  Temp:  97.9 F (36.6 C) 98.1 F (36.7 C) 97.8 F (36.6 C)  TempSrc:  Oral Oral Oral  Resp:  20 16 18   Height:      Weight:    165 lb 2 oz (74.9 kg)  SpO2:  100% 100% 99%   General appearance: alert, cooperative and no distress  Neck: no adenopathy, no carotid bruit, no JVD, supple, symmetrical, trachea midline and thyroid not enlarged, symmetric, no tenderness/mass/nodules  Lungs: clear to auscultation bilaterally  Heart: regular rate and rhythm, S1: normal, S2: increased intensity and systolic  murmur: holosystolic 2/6, high pitch at lower left sternal border  Abdomen: soft, non-tender; bowel sounds normal; no masses, no organomegaly  Extremities: extremities normal, atraumatic, no cyanosis or edema  Pulses: 2+ and symmetric  Skin: Skin color, texture, turgor normal. No rashes or lesions  Neurologic: Grossly normal   LABS  CBC  Recent Labs  07/18/13 2351 07/19/13 0937  WBC 5.5 4.6  NEUTROABS  --  2.4  HGB 11.8* 11.9*  HCT 36.7 37.1  MCV 85.7 84.3  PLT 222 208   Basic Metabolic Panel  Recent Labs  07/19/13 0937 07/20/13 0515  NA 137 136*  K 4.7 4.9  CL 101 96  CO2 20 27  GLUCOSE 82 103*  BUN 17 26*  CREATININE 0.99 1.65*  CALCIUM 8.8 9.2   Liver Function Tests  Recent Labs  07/19/13 0937  AST 22  ALT 17  ALKPHOS 97  BILITOT 0.4  PROT 7.8  ALBUMIN 3.8   No results found for this basename: LIPASE, AMYLASE,  in the last 72 hours Cardiac Enzymes  Recent Labs  07/19/13 0020 07/19/13 1158 07/19/13 1830  TROPONINI 0.34* <0.30 <0.30   Thyroid Function Tests  Recent Labs  07/20/13 1340  TSH 8.820*    Radiology Studies Imaging results have been reviewed  and No results found.  TELE A sense V paced  ECG NSR, CHB, V paced 100%  ASSESSMENT AND PLAN Marked new reduction in LV systolic function associated with chest pain and amarginal abnormality in cTropI on arrival. Recommend coronary angio and R/L heart cath. Cannot locate any record of coronary angio before either one of her previous valvular surgeries at [facility]. Will repeat labs - elevated creat from diuresis may prohibit contrast based procedure today.   Thurmon Fair, MD, [facility] [phone] office [phone] pager 07/21/2013 8:05 AM

## 2013-07-22 ENCOUNTER — Inpatient Hospital Stay (HOSPITAL_COMMUNITY): Payer: Medicaid Other

## 2013-07-22 DIAGNOSIS — T889XXS Complication of surgical and medical care, unspecified, sequela: Secondary | ICD-10-CM

## 2013-07-22 DIAGNOSIS — Z8679 Personal history of other diseases of the circulatory system: Secondary | ICD-10-CM

## 2013-07-22 DIAGNOSIS — T827XXA Infection and inflammatory reaction due to other cardiac and vascular devices, implants and grafts, initial encounter: Secondary | ICD-10-CM

## 2013-07-22 LAB — BASIC METABOLIC PANEL
Anion gap: 15 (ref 5–15)
BUN: 17 mg/dL (ref 6–23)
CO2: 23 mEq/L (ref 19–32)
Calcium: 9.3 mg/dL (ref 8.4–10.5)
Chloride: 100 mEq/L (ref 96–112)
Creatinine, Ser: 1.12 mg/dL — ABNORMAL HIGH (ref 0.50–1.10)
GFR calc Af Amer: 68 mL/min — ABNORMAL LOW (ref >90)
GFR calc non Af Amer: 58 mL/min — ABNORMAL LOW (ref >90)
GLUCOSE: 87 mg/dL (ref 70–99)
POTASSIUM: 4.1 meq/L (ref 3.7–5.3)
SODIUM: 138 meq/L (ref 137–147)

## 2013-07-22 MED ORDER — IOHEXOL 350 MG/ML SOLN: 100.0000 mL | Freq: Once | INTRAVENOUS | Status: AC | PRN

## 2013-07-22 MED ORDER — METOPROLOL TARTRATE 1 MG/ML IV SOLN
INTRAVENOUS | Status: AC
  Administered 2013-07-22: 5 mg
  Filled 2013-07-22: qty 5

## 2013-07-22 MED ORDER — NITROGLYCERIN 0.4 MG SL SUBL: 0.4000 mg | SUBLINGUAL_TABLET | SUBLINGUAL | Status: DC | PRN

## 2013-07-22 MED ORDER — LORAZEPAM 0.5 MG PO TABS
0.5000 mg | ORAL_TABLET | Freq: Four times a day (QID) | ORAL | Status: DC | PRN
  Administered 2013-07-22 – 2013-07-26 (×7): 0.5 mg via ORAL
  Filled 2013-07-22 (×7): qty 1

## 2013-07-22 MED ORDER — METOPROLOL TARTRATE 50 MG PO TABS
50.0000 mg | ORAL_TABLET | ORAL | Status: AC
  Filled 2013-07-22: qty 1

## 2013-07-22 MED ORDER — NITROGLYCERIN 0.4 MG SL SUBL
SUBLINGUAL_TABLET | SUBLINGUAL | Status: AC
  Administered 2013-07-22: 0.4 mg
  Filled 2013-07-22: qty 1

## 2013-07-22 MED ORDER — METOPROLOL TARTRATE 1 MG/ML IV SOLN: 5.0000 mg | Freq: Once | INTRAVENOUS | Status: AC

## 2013-07-22 NOTE — Progress Notes (Addendum)
[facility] PROGRESS NOTE  [name] ZOX:[id number] DOB: Dec 13, 1970 DOA: 07/19/2013 PCP: Doris CheadleADVANI, DEEPAK, MD  Assessment/Plan:  Principal Problem:   Acute systolic CHF. EF worse (30-35% with new global hypokinesis and inferoseptal akinesis). Cath report noted. Coronary CT pending. Plans per cardiology. Active Problems: Acute kidney injury: improved   Hypothyroidism: TSH high. Synthroid increased   Substance abuse: denies current IVDA. 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   Hepatitis C  Code Status:  full Family Communication:   Disposition Plan:  home  Consultants:  cardiology  Procedures:     Antibiotics:  levaquin 7/7-7/8  amox  HPI/Subjective: Worried about prognosis. Still with CP and DOE  Objective: Filed Vitals:   07/22/13 1508  BP: 90/52  Pulse: 83  Temp:   Resp:     Intake/Output Summary (Last 24 hours) at 07/22/13 1611 Last data filed at 07/22/13 1400  Gross per 24 hour  Intake    480 ml  Output   1300 ml  Net   -820 ml   Filed Weights   07/21/13 0457 07/21/13 0933 07/22/13 0642  Weight: 74.9 kg (165 lb 2 oz) 74.844 kg (165 lb) 75.751 kg (167 lb)    Exam:   General:  Tearful. Anxious.  Cardiovascular: RRR murmur present  Respiratory: CTA without WRR  Abdomen: s, nt, nd  Ext: minimal edema  Basic Metabolic Panel:  Recent Labs Lab 07/18/13 2351 07/19/13 0937 07/20/13 0515 07/21/13 0903 07/22/13 0450  NA 140 137 136* 137 138  K 4.5 4.7 4.9 4.4 4.1  CL 101 101 96 96 100  CO2 26 20 27 23 23   GLUCOSE 87 82 103* 125* 87  BUN 16 17 26* 23 17  CREATININE 1.23* 0.99 1.65* 1.15* 1.12*  CALCIUM 9.5 8.8 9.2 9.8 9.3   Liver Function Tests:  Recent Labs Lab 07/19/13 0937  AST 22  ALT 17  ALKPHOS 97  BILITOT 0.4  PROT 7.8  ALBUMIN 3.8   No results found for this basename: LIPASE, AMYLASE,  in the last 168 hours No results found for this basename:  AMMONIA,  in the last 168 hours CBC:  Recent Labs Lab 07/18/13 2351 07/19/13 0937  WBC 5.5 4.6  NEUTROABS  --  2.4  HGB 11.8* 11.9*  HCT 36.7 37.1  MCV 85.7 84.3  PLT 222 208   Cardiac Enzymes:  Recent Labs Lab 07/19/13 0020 07/19/13 1158 07/19/13 1830  TROPONINI 0.34* <0.30 <0.30   BNP (last 3 results)  Recent Labs  09/25/12 0936 02/06/13 1951 07/18/13 2351  PROBNP 6304.0* 1450.0* 2764.0*   CBG: No results found for this basename: GLUCAP,  in the last 168 hours  Recent Results (from the past 240 hour(s))  CULTURE, BLOOD (ROUTINE X 2)     Status: None   Collection Time    07/19/13 12:55 AM      Result Value Ref Range Status   Specimen Description BLOOD RIGHT ANTECUBITAL   Final   Special Requests BOTTLES DRAWN AEROBIC AND ANAEROBIC 10CC EA   Final   Culture  Setup Time     Final   Value: 07/19/2013 08:35     [facility]formed at [facility]   Culture     Final   Value:        BLOOD CULTURE RECEIVED NO GROWTH TO DATE CULTURE WILL BE HELD FOR 5 DAYS BEFORE ISSUING A FINAL NEGATIVE REPORT     Performed  at [facility]   Report Status PENDING   Incomplete  CULTURE, BLOOD (ROUTINE X 2)     Status: None   Collection Time    07/19/13  1:05 AM      Result Value Ref Range Status   Specimen Description BLOOD RIGHT HAND   Final   Special Requests     Final   Value: BOTTLES DRAWN AEROBIC AND ANAEROBIC 10CC AER,5CC ANA   Culture  Setup Time     Final   Value: 07/19/2013 08:35     Performed at [facility]   Culture     Final   Value:        BLOOD CULTURE RECEIVED NO GROWTH TO DATE CULTURE WILL BE HELD FOR 5 DAYS BEFORE ISSUING A FINAL NEGATIVE REPORT     Performed at [facility]   Report Status PENDING   Incomplete  URINE CULTURE     Status: None   Collection Time    07/19/13  1:46 AM      Result Value Ref Range Status   Specimen Description URINE, CLEAN CATCH   Final   Special Requests NONE   Final   Culture  Setup Time     Final    Value: 07/19/2013 02:14     Performed at [facility] Count     Final   Value: 60,000 COLONIES/ML     Performed at [facility]   Culture     Final   Value: ESCHERICHIA COLI     Performed at [facility]   Report Status 07/21/2013 FINAL   Final   Organism ID, Bacteria ESCHERICHIA COLI   Final     Studies: No results found. Echo  Left ventricle: The cavity size was normal. Wall thickness was normal. Systolic function was moderately to severely reduced. The estimated ejection fraction was in the range of 30% to 35%. Incoordinate septal motion. The study is not technically sufficient to allow evaluation of LV diastolic function. - Aortic valve: Bioprosthetic aortic valve. Small septally located paravalvular leak. Peak and mean gradients of 28 and 16 mmHg, respectively. Normal jet contour, AT <100 msec, no suggestion of obstruction. There was trivial regurgitation. - Mitral valve: Bioprosthetic mitral valve, well-seated. Trivial regurgitation. No obstruction. - Right ventricle: The cavity size was normal. RV systolic pressure (S, est): 44 mm Hg. - Tricuspid valve: Bioprosthetic valve. There is sinusodal flow in the tricuspid valve. There was moderate regurgitation. - Pulmonary arteries: PA peak pressure: 44 mm Hg (S). - Inferior vena cava: The vessel was dilated. The respirophasic diameter changes were blunted (< 50%), consistent with elevated central venous pressure.  Impressions:  - Compared to her prior echo in 04/2012, there is a new cardiomyopathy with global hypokinesis and inferoseptal akinesis. LVEF of 30-35%. Her bioprosthetic mitral, aortic and tricuspid valves appear stable - there is persistent tricuspid leaflet dysfunction with sinusoidal flow across the valve.  Scheduled Meds: . amoxicillin  500 mg Oral TID  . aspirin EC  81 mg Oral Daily  . levothyroxine  112 mcg Oral QAC breakfast  . metoprolol tartrate  50 mg Oral STAT  .  pneumococcal 23 valent vaccine  0.5 mL Intramuscular Tomorrow-1000  . sodium chloride  3 mL Intravenous Q12H  . sodium chloride  3 mL Intravenous Q12H   Continuous Infusions:    Time spent: 25 minutes  Mackensey Bolte L  [facility] Pager [phone]. If 7PM-7AM, please contact night-coverage at [email or web address],  password [email or web address] 07/22/2013, 4:11 PM  LOS: 3 days

## 2013-07-22 NOTE — Progress Notes (Signed)
Patient ID: [name], female   DOB: [birth year], 43 y.o.   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  Measures 4.5cm x 2.5 cm The LM and LAD are malpositioned superiorly due to this but widely patent with antigrade flow.  Discussed with Dr Royann Shivers who has been unable to get a response from Dr Earlene Plater at [facility] regarding transfer Dr Cornelius Moras has apparently  indicated that she is not a 3 time redo surgical candidate especially in light of her iv drug use Discussed with Dr Donata Clay who will see her tonight after he finishes an emergent pericardiectomy  Charlton Haws

## 2013-07-22 NOTE — Progress Notes (Addendum)
[facility] BRIEF PROGRESS NOTE   I had the opportunity to evaluate Ms. Saban in consultation during her hospitalization this past January (please see full consultation report from 02/11/2013).  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 I agree w/ plans for cardiac-gated CTA and I would consider repeat TEE.  However, risks associated with third time redo surgery would be extraordinarily high.  If surgery is to be considered I would recommend transfer to [facility] where her 2 previous operations were performed.  Discussed with Dr Sandria Manly.  OWEN,CLARENCE H 07/22/2013 1:06 PM

## 2013-07-22 NOTE — Progress Notes (Signed)
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 No dyspnea at rest, but dyspneic with minimal activity. Imaging studies reviewed in light of abnormalities on cardiac cath. On CTA of chest there is a thick-walled, aneurysmal, contrast-filled paraaortic space in the area of the left coronary sinus, extending upward from the prosthetic valve annulus for a couple of cm at least. 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 The left coronary artery and LAD artery are not distinguished, possibly compressed by this abscess/aneurysm although there appears to be contrast in the left circumflex artery. On TEE from April, an echo free space is seen as well, although shadowing from the three cardiac valve prostheses limits visualization. It is a new abnormality when compared to the TEE from January.  CURRENT MEDS . amoxicillin  500 mg Oral TID  . aspirin EC  81 mg Oral Daily  . levothyroxine  112 mcg Oral QAC breakfast  . pneumococcal 23 valent vaccine  0.5 mL Intramuscular Tomorrow-1000  . sodium chloride  3 mL Intravenous Q12H  . sodium chloride  3 mL Intravenous Q12H    OBJECTIVE   Intake/Output Summary (Last 24 hours) at 07/22/13 1224 Last data filed at 07/22/13 [id number]  Gross per 24 hour  Intake   1440 ml  Output   1500 ml  Net    -60 ml   Filed Weights   07/21/13 0457 07/21/13 0933 07/22/13 0642  Weight: 165 lb 2 oz (74.9 kg) 165 lb (74.844 kg) 167 lb (75.751 kg)    PHYSICAL EXAM Filed Vitals:   07/21/13 2100 07/21/13 2200 07/22/13 0136 07/22/13 0642  BP: 109/55 97/55  93/52 92/57  Pulse: 74 74 70 75  Temp:   98 F (36.7 C) 98.2 F (36.8 C)  TempSrc:   Oral Oral  Resp:   18 18  Height:      Weight:    167 lb (75.751 kg)  SpO2: 99% 98% 99% 99%   General appearance: alert, cooperative and no distress  Neck: no adenopathy, no carotid bruit, no JVD, supple, symmetrical, trachea midline and thyroid not enlarged, symmetric, no tenderness/mass/nodules  Lungs: clear to auscultation bilaterally  Heart: regular rate and rhythm, S1: normal, S2: increased intensity and systolic murmur: holosystolic 2/6, high pitch at lower left sternal border  Abdomen: soft, non-tender; bowel sounds normal; no masses, no organomegaly  Extremities: extremities normal, atraumatic, no cyanosis or edema  Pulses: 2+ and symmetric  Skin: Skin color, texture, turgor normal. No rashes or lesions  Neurologic: Grossly normal   LABS  CBC No results found for this basename: WBC, NEUTROABS, HGB, HCT, MCV, PLT,  in the last 72 hours Basic Metabolic Panel  Recent Labs  07/21/13 0903 07/22/13 0450  NA 137 138  K 4.4 4.1  CL 96 100  CO2 23 23  GLUCOSE 125* 87  BUN 23 17  CREATININE 1.15* 1.12*  CALCIUM 9.8 9.3   Liver Function Tests No results found for this basename: AST, ALT, ALKPHOS, BILITOT, PROT, ALBUMIN,  in the last 72 hours No results found  for this basename: LIPASE, AMYLASE,  in the last 72 hours Cardiac Enzymes  Recent Labs  07/19/13 1830  TROPONINI <0.30   BNP No components found with this basename: POCBNP,  D-Dimer No results found for this basename: DDIMER,  in the last 72 hours Hemoglobin A1C No results found for this basename: HGBA1C,  in the last 72 hours Fasting Lipid Panel No results found for this basename: CHOL, HDL, LDLCALC, TRIG, CHOLHDL, LDLDIRECT,  in the last 72 hours Thyroid Function Tests  Recent Labs  07/20/13 1340  TSH 8.820*    Radiology Studies See above  TELE 100% V paced   ASSESSMENT AND PLAN [name] appears to have a  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 This may be compromising flow in the left coronary/LAD artery distribution, causing her cardiomyopathy as well as angina and CHF. Blood cultures negative to date. Will perform a dedicated cardiac/coronary CTA for more accurate evaluation. She has had two previous surgical procedures for endocarditis. A third procedure would be very high risk. Will ask for a surgical evaluation. Prognosis is likely to be grim.   Thurmon Fair, MD, [facility] [phone] office [phone] pager 07/22/2013 12:24 PM

## 2013-07-22 NOTE — Progress Notes (Signed)
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 Discussed with Dr. Cornelius Moras who suggested we refer the patient back to [facility], where the two previous valvular surgeries were performed. I have placed calls to Dr. Sonny Dandy in CV surgery and to the on-call Cardiologist [facility], but unfortunately have not yet heard back from them.  Thurmon Fair, MD, [facility] [phone] office [phone] pager

## 2013-07-23 DIAGNOSIS — I712 Thoracic aortic aneurysm, without rupture, unspecified: Secondary | ICD-10-CM

## 2013-07-23 DIAGNOSIS — Z954 Presence of other heart-valve replacement: Secondary | ICD-10-CM

## 2013-07-23 MED ORDER — OXYCODONE-ACETAMINOPHEN 5-325 MG PO TABS: 2.0000 | ORAL_TABLET | ORAL | Status: DC | PRN

## 2013-07-23 MED ORDER — OXYCODONE-ACETAMINOPHEN 5-325 MG PO TABS: 1.0000 | ORAL_TABLET | ORAL | Status: DC | PRN

## 2013-07-23 MED ORDER — OXYCODONE-ACETAMINOPHEN 5-325 MG PO TABS
1.0000 | ORAL_TABLET | ORAL | Status: DC | PRN
  Administered 2013-07-23: 1 via ORAL
  Administered 2013-07-24 – 2013-07-26 (×12): 2 via ORAL
  Filled 2013-07-23 (×3): qty 2
  Filled 2013-07-23: qty 1
  Filled 2013-07-23 (×9): qty 2
  Filled 2013-07-23: qty 1

## 2013-07-23 MED ORDER — FUROSEMIDE 20 MG PO TABS
20.0000 mg | ORAL_TABLET | Freq: Every day | ORAL | Status: DC
  Administered 2013-07-23: 20 mg via ORAL
  Filled 2013-07-23 (×2): qty 1
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
## 2013-07-23 NOTE — Consult Note (Signed)
Patient LT:[name] A Dolle      DOB: [birth year]      PQZ:[id number]     Consult Note from the Palliative Medicine Team at [facility]    Consult Requested by:  Dr. Royann Shivers     PCP: Doris Cheadle, MD Reason for Consultation:GOC, symptom managment     Phone Number:[phone]  Assessment of patients Current state: 43 yr old white female with a known history of injection drug use with resultant endocarditis (enteroccocus)  s/p two previous cardiac surgeries with Valve replacements including MVR, TVR AOV.  Patient presents with increased edema, shortness of breath. Patient with EF of 30-35% and global hypokinesis, and worsened creatinine.  Patient reports that she understands the terminal nature of her illness and that she can not have further surgical revisions.  We talked through the anxiety of having a condition that could decompensate at any time.  She finds strength in her [employer] beliefs and her children and 5 grandchildren.  She would like to consider medical management and hospice care at discharge.   Goals of Care: 1.  Code Status: DNR established by patient  2. Scope of Treatment: Continue to optimize medical management of her disease while she gets her affairs in order and ask hospice to assist at discharge . She is going to decide on sitting down with me to talk with her three children.  4. Disposition: to her friends house with hospice care, ultimately would benefit from using hospice home as symptoms worsen. She does not want to live with her sister.   3. Symptom Management:   1. Anxiety/Agitation: low dose ativan or xanax very appropriate 2. Pain: chronic chest pain.  Patient reports she has done very well with methadone and percocet.  I see no reason that she can not have access to percocet. Hospice could consider conversion to Methadone at any time. 3. Bowel Regimen: monitor  4. Psychosocial: 3 children who she is very proud of. Lots of regret for not being a better mother.   Loves her grandchildren.  5. Spiritual: [employer] faith, offered Spiritual care services as needed.        Patient Documents Completed or Given: Document Given Completed  Advanced Directives Pkt    MOST    DNR    Gone from My Sight    Hard Choices      Brief HPI: 43 yr old with increasing shortness of breath and chest pain. Asked to assist with goals of care and symptom management in light of news that she can not have further surgeries and that she has a life limiting mycotic aneurysm.   ROS: chest pressure/pain,  Shortness of breath , anxiety.  Appetite ok.     PMH:  Past Medical History  Diagnosis Date  . Bacterial endocarditis     a. 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 b. re-do mitral valve replacement, tricuspid valve replacement, and aortic valve replacement (all bioprosthetic).  [name] DJD (degenerative joint disease)   . Hepatitis C   . Anemia   . Hypothyroidism   . 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 Anxiety   . Mental disorder   . Chronic pain   . Noncompliance   . Polysubstance abuse   . Sepsis     a. 05/2010 with sepsis, vegegations on valve replacements, ARDS, VDRF, multiorgan failure with elevated cardiac enzymes, suffered a left fronto-parietal subarachnoid hemorrhage, pulmonary  infarcts, ARF requiring brief hemodialysis. s/p perciardial MVR/TVR.  [name]. Atrial flutter     a. Post-op from valve replacement 06/2010.  [name]. CHB (complete heart block)     a. After valve replacement 09/2012 - pacer placement.  . Opiate abuse, episodic   . S/P AVR, redo MVR and redo TVR with bioprosthetic valves 09/26/2012    All bioprosthetic tissue valves placed for Strep viridans prosthetic valve endocarditis [facility]  . S/P placement of cardiac pacemaker 10/08/2012    Transvenous permanent pacemaker for CHB following redo TVR, redo MVR, AVR @ [facility]  . 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 Cerebral septic emboli 05/2010    septic emboli to brain  . Shortness of breath      PSH: Past Surgical History  Procedure Laterality Date  . Cholecystectomy    . Tonsillectomy and adenoidectomy    . Valve replacement  09/26/2012    AVR + redo MVR + redo TVR for PVE @ [facility]  . Valve replacement  06/2010    MVR + TVR for bacterial endocarditis @ [facility]  . Pacemaker placement  10/08/2012    [facility] for CHB after redo TVR  . Cataract extraction    . Multiple extractions with alveoloplasty  09/01/2011    Procedure: MULTIPLE EXTRACION WITH ALVEOLOPLASTY;  Surgeon: Georgia LopesScott M Jensen, DDS;  Location: [facility] OR;  Service: Oral Surgery;  Laterality: N/A;  . Tee without cardioversion N/A 02/09/2013    Procedure: TRANSESOPHAGEAL ECHOCARDIOGRAM (TEE);  Surgeon: Laurey Moralealton S McLean, MD;  [facility];  Service: Cardiovascular;  Laterality: N/A;  pt awaker, responsive,..tol procedure well...MAP 76  . Tee without cardioversion N/A 04/27/2013    Procedure: TRANSESOPHAGEAL ECHOCARDIOGRAM (TEE);  Surgeon: Chrystie NoseKenneth C. Hilty, MD;  [facility];  Service: Cardiovascular;  Laterality: N/A;   I have reviewed the FH and SH and  If appropriate update it with new information. No Known Allergies Scheduled Meds: . amoxicillin  500 mg Oral TID  . aspirin EC  81 mg Oral Daily  . furosemide  20 mg Oral Daily  . levothyroxine  112 mcg Oral QAC breakfast  . pneumococcal 23 valent vaccine  0.5 mL Intramuscular Tomorrow-1000  . sodium chloride  3 mL Intravenous Q12H  . sodium chloride  3 mL Intravenous Q12H   Continuous Infusions:  PRN Meds:.acetaminophen, acetaminophen, alum & mag hydroxide-simeth, LORazepam, morphine injection, ondansetron (ZOFRAN) IV, ondansetron    BP 92/58  Pulse 79  Temp(Src) 98 F (36.7 C) (Oral)  Resp 20  Ht 5\' 4"  (1.626 m)  Wt 75.841 kg (167 lb  3.2 oz)  BMI 28.69 kg/m2  SpO2 100%  LMP 07/14/2013   PPS: 50%   Intake/Output Summary (Last 24 hours) at 07/23/13 1623 Last data filed at 07/23/13 1344  Gross per 24 hour  Intake   1163 ml  Output    700 ml  Net    463 ml    Physical Exam:  General: mild respiratory distress especially with minimal exertion, not disturbing to her but noticeable HEENT:  PERRL, EOMi, anicteric, mmm Chest:   Decreased with some basilar rhonchi, no wheezing CVS: regular, S1, S2 2/6 systolic murmur Abdomen: soft, not tender or distended, positive bowel sounds Ext: trace to 1 pedal edema Neuro:awake, alert oriented, appropriate range of emotions  Labs: CBC    Component Value Date/Time   WBC  4.6 07/19/2013 0937   RBC 4.40 07/19/2013 0937   RBC 3.84* 02/07/2013 1002   HGB 11.9* 07/19/2013 0937   HCT 37.1 07/19/2013 0937   PLT 208 07/19/2013 0937   MCV 84.3 07/19/2013 0937   MCH 27.0 07/19/2013 0937   MCHC 32.1 07/19/2013 0937   RDW 16.7* 07/19/2013 0937   LYMPHSABS 1.5 07/19/2013 0937   MONOABS 0.4 07/19/2013 0937   EOSABS 0.3 07/19/2013 0937   BASOSABS 0.1 07/19/2013 0937     CMP     Component Value Date/Time   NA 138 07/22/2013 0450   K 4.1 07/22/2013 0450   CL 100 07/22/2013 0450   CO2 23 07/22/2013 0450   GLUCOSE 87 07/22/2013 0450   BUN 17 07/22/2013 0450   CREATININE 1.12* 07/22/2013 0450   CALCIUM 9.3 07/22/2013 0450   PROT 7.8 07/19/2013 0937   ALBUMIN 3.8 07/19/2013 0937   AST 22 07/19/2013 0937   ALT 17 07/19/2013 0937   ALKPHOS 97 07/19/2013 0937   BILITOT 0.4 07/19/2013 0937   GFRNONAA 58* 07/22/2013 0450   GFRAA 68* 07/22/2013 0450    Chest Xray Reviewed/Impressions:  1. Patchy right lower lobe infiltrate, suspicious for pneumonia.  2. Blunting of the right costophrenic angle, suggestive of  associated small parapneumonic effusion.    CT scan of the Chest Reviewed/Impressions: No evidence of significant pulmonary embolus. Cardiac enlargement.  Atelectasis and/ infiltration in the lung bases.    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  Measures 4.5 x 2.5 cm The ascending aortic root above the  Coronary ostia only measures 2.6 cm and is normal through the arch  2) Normal antegrade flow to the RCA, LM, LAD and circumflex with  malposition of the LM and LAD due to the pseudo/mycotic aneurysm  3) Normal ascending aorta and arch above the coronary insertions    Time In Time Out Total Time Spent with Patient Total Overall Time  305 pm 425 pm 80  min 80  min    Greater than 50%  of this time was spent counseling and coordinating care related to the above assessment and plan.  Panhia Karl L. Ladona Ridgel, MD MBA The Palliative Medicine Team at [facility] Phone: [phone] Pager: [phone] ( Use team phone after hours)

## 2013-07-23 NOTE — Plan of Care (Signed)
Problem: Discharge Progression Outcomes Goal: Barriers To Progression Addressed/Resolved Outcome: Progressing Continues to have mid upper chest pain.  Treated with IV Morphine 1mg .  Tolerating medication without adverse effects.  No other acute events this shift.

## 2013-07-23 NOTE — Progress Notes (Signed)
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 This was not seen on her scan in January The patient has developed severe LV dysfunction with EF 30%, global hypokinesia which is responsible for most of her symptoms of decreased exercise tolerance shortness of breath and weight loss.  The patient would not survive a third redo sternotomy for reconstruction of her ascending aorta- which would require hypothermic circulatory arrest- due to her severe LV dysfunction.  This was discussed with the patient and she understands that her best option is continued medical therapy and avoidance of smoking and other abusive substances.  I agree with DrOwen's that a second opinion be considered at the patient's request from her primary surgeon at [facility]-Dr. Nathaniel Man. However the patient states that after the lengthy recovery following her second triple valve replacement operation that Dr. Earlene Plater said there was no possibility of a third sternotomy/heart operation.  Objective: Vital signs in last 24 hours: Temp:  [98 F (36.7 C)-98.4 F (36.9 C)] 98 F (36.7 C) (07/11 0433) Pulse Rate:  [66-83] 70 (07/11 0433) Cardiac Rhythm:  [-] A-V Sequential paced (07/11 0900) Resp:  [18] 18 (07/11 0433) BP: (88-94)/(52-60) 88/52 mmHg (07/11 0433) SpO2:  [96 %-100 %] 100 % (07/11 0433) Weight:  [167 lb 3.2 oz (75.841 kg)] 167 lb 3.2 oz (75.841 kg) (07/11 0433)  Hemodynamic parameters for last 24 hours:   afebrile stable rhythm-paced  Intake/Output from previous day: 07/10 0701 - 07/11 0700 In: 923 [P.O.:920; I.V.:3] Out: 700 [Urine:700] Intake/Output this  shift: Total I/O In: 480 [P.O.:480] Out: 300 [Urine:300]  Mild to moderate pedal edema Breath sounds diminished No hematoma at site of cardiac catheterization  Lab Results: No results found for this basename: WBC, HGB, HCT, PLT,  in the last 72 hours BMET:  Recent Labs  07/21/13 0903 07/22/13 0450  NA 137 138  K 4.4 4.1  CL 96 100  CO2 23 23  GLUCOSE 125* 87  BUN 23 17  CREATININE 1.15* 1.12*  CALCIUM 9.8 9.3    PT/INR: No results found for this basename: LABPROT, INR,  in the last 72 hours ABG    Component Value Date/Time   PHART 7.461* 09/25/2012 1527   HCO3 19.9* 09/25/2012 1527   TCO2 21 09/25/2012 1527   ACIDBASEDEF 2.0 09/25/2012 1527   O2SAT 94.0 09/25/2012 1527   CBG (last 3)  No results found for this basename: GLUCAP,  in the last 72 hours  Assessment/Plan: S/P Procedure(s) (LRB): LEFT AND RIGHT HEART CATHETERIZATION WITH CORONARY ANGIOGRAM (N/A) Would not recommend third time sternotomy-redo aortic root reconstruction as discussed above.   LOS: 4 days    VAN TRIGT III,PETER 07/23/2013

## 2013-07-23 NOTE — Progress Notes (Signed)
[facility] PROGRESS NOTE  [name] ZOX:[id number] DOB: Sep 21, 1970 DOA: 07/19/2013 PCP: Doris CheadleADVANI, DEEPAK, MD  Assessment/Plan:  Principal Problem:   Acute systolic CHF. 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 Acute kidney injury: improved   Hypothyroidism: TSH high. Synthroid increased   Substance abuse: denies current IVDA. 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   Hepatitis C Not a candidate for surgery and patient agrees not to [facility].  She wishes to go home with hospice and enjoy the time she has left with children and grandchildren. She is DNR. Discussed with Dr. Ladona Ridgelaylor  Code Status:  DNR Family Communication:   Disposition Plan:  Home with hospice when arranged.  Consultants:  cardiology  Procedures:     Antibiotics:  levaquin 7/7-7/8  amox  HPI/Subjective: Pain and DOE continues, but better overall. Does not want surgery.  Objective: Filed Vitals:   07/23/13 1407  BP: 92/58  Pulse: 79  Temp: 98 F (36.7 C)  Resp: 20    Intake/Output Summary (Last 24 hours) at 07/23/13 1629 Last data filed at 07/23/13 1344  Gross per 24 hour  Intake   1163 ml  Output    700 ml  Net    463 ml   Filed Weights   07/21/13 0933 07/22/13 0642 07/23/13 0433  Weight: 74.844 kg (165 lb) 75.751 kg (167 lb) 75.841 kg (167 lb 3.2 oz)    Exam:   General:  Calm and comfotable  Cardiovascular: RRR murmur present  Respiratory: CTA without WRR  Abdomen: s, nt, nd  Ext: minimal edema  Basic Metabolic Panel:  Recent Labs Lab 07/18/13 2351 07/19/13 0937 07/20/13 0515 07/21/13 0903 07/22/13 0450  NA 140 137 136* 137 138  K 4.5 4.7 4.9 4.4 4.1  CL 101 101 96 96 100  CO2 26 20 27 23 23   GLUCOSE 87 82 103* 125* 87  BUN 16 17 26* 23 17  CREATININE 1.23* 0.99 1.65* 1.15* 1.12*  CALCIUM 9.5 8.8 9.2 9.8 9.3   Liver  Function Tests:  Recent Labs Lab 07/19/13 0937  AST 22  ALT 17  ALKPHOS 97  BILITOT 0.4  PROT 7.8  ALBUMIN 3.8   No results found for this basename: LIPASE, AMYLASE,  in the last 168 hours No results found for this basename: AMMONIA,  in the last 168 hours CBC:  Recent Labs Lab 07/18/13 2351 07/19/13 0937  WBC 5.5 4.6  NEUTROABS  --  2.4  HGB 11.8* 11.9*  HCT 36.7 37.1  MCV 85.7 84.3  PLT 222 208   Cardiac Enzymes:  Recent Labs Lab 07/19/13 0020 07/19/13 1158 07/19/13 1830  TROPONINI 0.34* <0.30 <0.30   BNP (last 3 results)  Recent Labs  09/25/12 0936 02/06/13 1951 07/18/13 2351  PROBNP 6304.0* 1450.0* 2764.0*   CBG: No results found for this basename: GLUCAP,  in the last 168 hours  Recent Results (from the past 240 hour(s))  CULTURE, BLOOD (ROUTINE X 2)     Status: None   Collection Time    07/19/13 12:55 AM      Result Value Ref Range Status   Specimen Description BLOOD RIGHT ANTECUBITAL   Final   Special Requests BOTTLES DRAWN AEROBIC AND ANAEROBIC 10CC EA   Final   Culture  Setup Time     Final   Value: 07/19/2013 08:35     Performed at  [facility]   Culture     Final   Value:        BLOOD CULTURE RECEIVED NO GROWTH TO DATE CULTURE WILL BE HELD FOR 5 DAYS BEFORE ISSUING A FINAL NEGATIVE REPORT     Performed at [facility]   Report Status PENDING   Incomplete  CULTURE, BLOOD (ROUTINE X 2)     Status: None   Collection Time    07/19/13  1:05 AM      Result Value Ref Range Status   Specimen Description BLOOD RIGHT HAND   Final   Special Requests     Final   Value: BOTTLES DRAWN AEROBIC AND ANAEROBIC 10CC AER,5CC ANA   Culture  Setup Time     Final   Value: 07/19/2013 08:35     Performed at [facility]   Culture     Final   Value:        BLOOD CULTURE RECEIVED NO GROWTH TO DATE CULTURE WILL BE HELD FOR 5 DAYS BEFORE ISSUING A FINAL NEGATIVE REPORT     Performed at [facility]   Report Status PENDING    Incomplete  URINE CULTURE     Status: None   Collection Time    07/19/13  1:46 AM      Result Value Ref Range Status   Specimen Description URINE, CLEAN CATCH   Final   Special Requests NONE   Final   Culture  Setup Time     Final   Value: 07/19/2013 02:14     Performed at [facility] Count     Final   Value: 60,000 COLONIES/ML     Performed at [facility]   Culture     Final   Value: ESCHERICHIA COLI     Performed at [facility]   Report Status 07/21/2013 FINAL   Final   Organism ID, Bacteria ESCHERICHIA COLI   Final     Studies: Ct Coronary Morp W/cta Cor W/score W/ca W/cm &/or Wo/cm  07/22/2013   ADDENDUM REPORT: 07/22/2013 18:57  CLINICAL DATA:  Chest Pain, IV Drug User with bioprosthetic valves in aortic, tricuspid and mitral positions. Unable to visualize LM at catheterization. New decrease in EF 35%  EXAM: Cardiac CTA  MEDICATIONS: Sub lingual nitro. 4mg  and lopressor 5mg  Meds limited by hypotension  TECHNIQUE: The patient was scanned on a Philips 256 slice scanner. Gantry rotation speed was 270 msecs. Collimation was .9mm. A 100 kV prospective scan was triggered in the descending thoracic aorta at 111 HU's with 5% padding centered around 78% of the R-R interval. Average HR during the scan was 78 bpm. The 3D data set was interpreted on a dedicated work station using MPR, MIP and VRT modes. A total of 80cc of contrast was used.  FINDINGS: Non-cardiac: See separate report from [facility]. No significant findings on limited lung and soft tissue windows.  Coronary Arteries: Right dominant with no anomalies  LM: Patent superiorly displaced by large periaortic abscess vs pseudoaneurysm.  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 It extends between  the aorta and the RVOT and may be causing some outflow tract obstruction  In near AP dimension it measures 4.5 cephalad  to caudal and 2.5 cm left to right It is pushing the LM ostia superiorly at some what of a sharp angle However there is normal antegrade flow to the LM, LAD and circumflex.  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 Measures 4.5 x 2.5 cm The ascending aortic root above the  Coronary ostia only measures 2.6 cm and is normal through the arch  2) Normal antegrade flow to the RCA, LM, LAD and circumflex with malposition of the LM and LAD due to the pseudo/mycotic aneurysm  3) Normal ascending aorta and arch above the coronary insertions  Charlton Haws   Electronically Signed   By: Charlton Haws M.D.   On: 07/22/2013 18:57   07/22/2013   EXAM: OVER-READ INTERPRETATION  CT CHEST  The following report is an over-read performed by radiologist Dr. Maryelizabeth Rowan [facility] on 07/22/2013. This over-read does not include interpretation of cardiac or coronary anatomy or pathology. The coronary CT has interpretation by the cardiologist is attached.  COMPARISON:  CTA 07/19/2013  FINDINGS: No suspicious pulmonary nodules. There is linear atelectasis at the right lung base. No axillary supraclavicular lymphadenopathy. Midline sternotomy noted with some angulation.  IMPRESSION: 1. Right phases are linear atelectasis. 2. Midline sternotomy with angulation.  Electronically Signed: By: Genevive Bi M.D. On: 07/22/2013 16:24   Echo  Left ventricle: The cavity size was normal. Wall thickness was normal. Systolic function was moderately to severely reduced. The estimated ejection fraction was in the range of 30% to 35%. Incoordinate septal  motion. The study is not technically sufficient to allow evaluation of LV diastolic function. - Aortic valve: Bioprosthetic aortic valve. Small septally located paravalvular leak. Peak and mean gradients of 28 and 16 mmHg, respectively. Normal jet contour, AT <100 msec, no suggestion of obstruction. There was trivial regurgitation. - Mitral valve: Bioprosthetic mitral valve, well-seated. Trivial regurgitation. No obstruction. - Right ventricle: The cavity size was normal. RV systolic pressure (S, est): 44 mm Hg. - Tricuspid valve: Bioprosthetic valve. There is sinusodal flow in the tricuspid valve. There was moderate regurgitation. - Pulmonary arteries: PA peak pressure: 44 mm Hg (S). - Inferior vena cava: The vessel was dilated. The respirophasic diameter changes were blunted (< 50%), consistent with elevated central venous pressure.  Impressions:  - Compared to her prior echo in 04/2012, there is a new cardiomyopathy with global hypokinesis and inferoseptal akinesis. LVEF of 30-35%. Her bioprosthetic mitral, aortic and tricuspid valves appear stable - there is persistent tricuspid leaflet dysfunction with sinusoidal flow across the valve.  Scheduled Meds: . amoxicillin  500 mg Oral TID  . aspirin EC  81 mg Oral Daily  . furosemide  20 mg Oral Daily  . levothyroxine  112 mcg Oral QAC breakfast  . pneumococcal 23 valent vaccine  0.5 mL Intramuscular Tomorrow-1000  . sodium chloride  3 mL Intravenous Q12H  . sodium chloride  3 mL Intravenous Q12H   Continuous Infusions:    Time spent: 25 minutes  Deklyn Gibbon L  [facility] Pager [phone]. If 7PM-7AM, please contact night-coverage at [email or web address], password [facility] 07/23/2013, 4:29 PM  LOS: 4 days

## 2013-07-23 NOTE — Progress Notes (Addendum)
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 [name] has chronic pain. Imaging studies reviewed in light of abnormalities on cardiac cath. 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 There is malposition of the LM and LAD due to aneurysm.  CURRENT MEDS . amoxicillin  500 mg Oral TID  . aspirin EC  81 mg Oral Daily  . levothyroxine  112 mcg Oral QAC breakfast  . metoprolol tartrate  50 mg Oral STAT  . pneumococcal 23 valent vaccine  0.5 mL Intramuscular Tomorrow-1000  . sodium chloride  3 mL Intravenous Q12H  . sodium chloride  3 mL Intravenous Q12H    OBJECTIVE   Intake/Output Summary (Last 24 hours) at 07/23/13 1324 Last data filed at 07/23/13 0857  Gross per 24 hour  Intake   1163 ml  Output   1000 ml  Net    163 ml   Filed Weights   07/21/13 0933 07/22/13 0642 07/23/13 0433  Weight: 165 lb (74.844 kg) 167 lb (75.751 kg) 167 lb 3.2 oz (75.841 kg)    PHYSICAL EXAM Filed Vitals:   07/22/13 1401 07/22/13 1508 07/22/13 2108 07/23/13 0433  BP: 89/52 90/52 94/60  88/52  Pulse: 79 83 66 70  Temp: 98.2 F (36.8 C)  98.4 F (36.9 C) 98 F (36.7 C)  TempSrc: Oral  Oral Oral  Resp: 18  18 18   Height:      Weight:    167 lb 3.2 oz (75.841 kg)  SpO2: 96%  100% 100%   General appearance: alert, cooperative and no distress  Neck: no adenopathy, no carotid bruit, no JVD, supple, symmetrical, trachea midline and thyroid not enlarged, symmetric, no tenderness/mass/nodules  Lungs: diminshed breath sounds at the  bases Heart: regular rate and rhythm, S1: normal, S2: increased intensity and systolic murmur: holosystolic 2/6, high pitch at lower left sternal border  Abdomen: soft, non-tender; bowel sounds normal; no masses, no organomegaly  Extremities: extremities normal, atraumatic, no cyanosis or edema  Pulses: 2+ and symmetric  Skin: Skin color, texture, turgor normal. No rashes or lesions  Neurologic: Grossly normal   LABS  CBC No results found for this basename: WBC, NEUTROABS, HGB, HCT, MCV, PLT,  in the last 72 hours Basic Metabolic Panel  Recent Labs  07/21/13 0903 07/22/13 0450  NA 137 138  K 4.4 4.1  CL 96 100  CO2 23 23  GLUCOSE 125* 87  BUN 23 17  CREATININE 1.15* 1.12*  CALCIUM 9.8 9.3   Liver Function Tests No results found for this basename: AST, ALT, ALKPHOS, BILITOT, PROT, ALBUMIN,  in the last 72 hours No results found for this basename: LIPASE, AMYLASE,  in the last 72 hours Cardiac Enzymes No results found for this basename: CKTOTAL, CKMB, CKMBINDEX, TROPONINI,  in the last 72 hours BNP No components found with this basename: POCBNP,  D-Dimer No results found for this basename: DDIMER,  in the last 72 hours Hemoglobin A1C No results found for this basename: HGBA1C,  in the last 72 hours  Fasting Lipid Panel No results found for this basename: CHOL, HDL, LDLCALC, TRIG, CHOLHDL, LDLDIRECT,  in the last 72 hours Thyroid Function Tests  Recent Labs  07/20/13 1340  TSH 8.820*    Radiology Studies See above  TELE 100% V paced   ASSESSMENT AND PLAN I had a long discussion today with [name] who is my office patient. 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 [name] had a  newly diagnosed cardiomyopathy with EF of 30-35%. [name] symptomatically improved with diuresis. I appreciate the 2 surgical consults that were obtained. Both cardiothoracic surgeons feel that [name] is at prohibitive risk for future surgery. [name] understands this and is not interested in surgery, therefore we will not pursue transfer to [facility]. [name] understands that despite treatment this pseudoaneurysm may continue to evolve and possibly to aortic rupture which could lead to sudden death.  After discussion [name] wishes to be DO NOT RESUSCITATE. I discussed the case with Dr. Lendell Caprice and we will pursue a palliative care consult. Her main issues have to do with persistent chest pain and I believe [name] would do better at home with hospice care and pain management.  Start low dose lasix for diuresis as renal function has recovered - will have to be careful to see if [name] tolerates it with her low blood pressure.  Chrystie Nose, MD, [facility] Attending Cardiologist [facility]   07/23/2013 1:24 PM

## 2013-07-23 NOTE — Plan of Care (Signed)
Problem: Phase II Progression Outcomes Goal: Dyspnea controlled with activity Outcome: Adequate for Discharge No shortness of breath with ambulation.  Able to verbalize needs without being gasping.

## 2013-07-24 DIAGNOSIS — R079 Chest pain, unspecified: Secondary | ICD-10-CM

## 2013-07-24 MED ORDER — FUROSEMIDE 40 MG PO TABS
40.0000 mg | ORAL_TABLET | Freq: Every day | ORAL | Status: DC
  Administered 2013-07-24 – 2013-07-26 (×3): 40 mg via ORAL
  Filled 2013-07-24 (×4): qty 1

## 2013-07-24 NOTE — Progress Notes (Signed)
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   CURRENT MEDS . amoxicillin  500 mg Oral TID  . aspirin EC  81 mg Oral Daily  . furosemide  20 mg Oral Daily  . levothyroxine  112 mcg Oral QAC breakfast  . pneumococcal 23 valent vaccine  0.5 mL Intramuscular Tomorrow-1000  . sodium chloride  3 mL Intravenous Q12H  . sodium chloride  3 mL Intravenous Q12H    OBJECTIVE   Intake/Output Summary (Last 24 hours) at 07/24/13 0944 Last data filed at 07/24/13 0500  Gross per 24 hour  Intake    780 ml  Output    100 ml  Net    680 ml   Filed Weights   07/22/13 0642 07/23/13 0433 07/24/13 0606  Weight: 167 lb (75.751 kg) 167 lb 3.2 oz (75.841 kg) 167 lb 11.2 oz (76.068 kg)    PHYSICAL EXAM Filed Vitals:   07/23/13 0433 07/23/13 1407 07/23/13 2019 07/24/13 0606  BP: 88/52 92/58 107/72 103/61  Pulse: 70 79 86 74  Temp: 98 F (36.7 C) 98 F (36.7 C) 98 F (36.7 C) 97.5 F (36.4 C)  TempSrc: Oral Oral Oral Oral  Resp: 18 20 18 18   Height:      Weight: 167 lb 3.2 oz (75.841 kg)   167 lb 11.2 oz (76.068 kg)  SpO2: 100% 100% 99% 98%   General appearance: alert, cooperative and no distress  Neck: no adenopathy, no carotid bruit, no JVD, supple, symmetrical, trachea midline and thyroid not enlarged, symmetric, no tenderness/mass/nodules  Lungs: diminshed breath sounds at the bases Heart: regular rate and rhythm, S1: normal, S2: increased intensity and systolic murmur: holosystolic 2/6, high pitch at lower left sternal border  Abdomen: soft, non-tender; bowel sounds  normal; no masses, no organomegaly  Extremities: extremities normal, atraumatic, no cyanosis or edema  Pulses: 2+ and symmetric  Skin: Skin color, texture, turgor normal. No rashes or lesions  Neurologic: Grossly normal   LABS  CBC No results found for this basename: WBC, NEUTROABS, HGB, HCT, MCV, PLT,  in the last 72 hours Basic Metabolic Panel  Recent Labs  07/22/13 0450  NA 138  K 4.1  CL 100  CO2 23  GLUCOSE 87  BUN 17  CREATININE 1.12*  CALCIUM 9.3   Liver Function Tests No results found for this basename: AST, ALT, ALKPHOS, BILITOT, PROT, ALBUMIN,  in the last 72 hours No results found for this basename: LIPASE, AMYLASE,  in the last 72 hours Cardiac Enzymes No results found for this basename: CKTOTAL, CKMB, CKMBINDEX, TROPONINI,  in the last 72 hours BNP No components found with this basename: POCBNP,  D-Dimer No results found for this basename: DDIMER,  in the last 72 hours Hemoglobin A1C No results found for this basename: HGBA1C,  in the last 72 hours Fasting Lipid Panel No results found for this basename: CHOL, HDL, LDLCALC, TRIG, CHOLHDL, LDLDIRECT,  in the last 72 hours Thyroid Function Tests No results found for this basename: TSH, T4TOTAL, FREET3, T3FREE, THYROIDAB,  in  the last 72 hours  Radiology Studies See above  TELE 100% V paced   ASSESSMENT AND PLAN Plan for evaluation of home hospice. Would work toward this and discharge. Lasix restarted yesterday. Creatinine remains stable. Increase dose to 40 mg daily to keep net even.  No further suggestions at this time.  Chrystie Nose, MD, [facility] Attending Cardiologist [facility]   07/24/2013 9:44 AM

## 2013-07-24 NOTE — Progress Notes (Signed)
[facility] PROGRESS NOTE  [name] ZOX:[id number] DOB: 05-21-1970 DOA: 07/19/2013 PCP: Doris CheadleADVANI, DEEPAK, MD  Assessment/Plan:  Principal Problem:   Acute systolic CHF.  EF shows new systolic dysfunction 35%. Started back on lasix Active Problems: New pseudoaneurism and possible abscess of ascending aorta: not an operative candidate. Home when home hospice can be arranged. Dr. Ladona Ridgelaylor to arrange Monday. Pt is uninsured. Severe TR. Acute kidney injury: improved   Hypothyroidism: TSH high. Synthroid increased   Substance abuse: denies current IVDA. 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  Consultants:  Cardiology  PMT  Procedures:     Antibiotics:  levaquin 7/7-7/8  amox  HPI/Subjective: Pain better. Has spoken to family about transitioning to hospice.  Objective: Filed Vitals:   07/24/13 0606  BP: 103/61  Pulse: 74  Temp: 97.5 F (36.4 C)  Resp: 18    Intake/Output Summary (Last 24 hours) at 07/24/13 1405 Last data filed at 07/24/13 1032  Gross per 24 hour  Intake    900 ml  Output    100 ml  Net    800 ml   Filed Weights   07/22/13 0642 07/23/13 0433 07/24/13 0606  Weight: 75.751 kg (167 lb) 75.841 kg (167 lb 3.2 oz) 76.068 kg (167 lb 11.2 oz)    Exam:   General:  Cooperative. Weak appearing. Occasionally tearful  Cardiovascular: RRR murmur present  Respiratory: CTA without WRR  Abdomen: s, nt, nd  Ext: minimal edema  Basic Metabolic Panel:  Recent Labs Lab 07/18/13 2351 07/19/13 0937 07/20/13 0515 07/21/13 0903 07/22/13 0450  NA 140 137 136* 137 138  K 4.5 4.7 4.9 4.4 4.1  CL 101 101 96 96 100  CO2 26 20 27 23 23   GLUCOSE 87 82 103* 125* 87  BUN 16 17 26* 23 17  CREATININE 1.23* 0.99 1.65* 1.15* 1.12*  CALCIUM 9.5 8.8  9.2 9.8 9.3   Liver Function Tests:  Recent Labs Lab 07/19/13 0937  AST 22  ALT 17  ALKPHOS 97  BILITOT 0.4  PROT 7.8  ALBUMIN 3.8   No results found for this basename: LIPASE, AMYLASE,  in the last 168 hours No results found for this basename: AMMONIA,  in the last 168 hours CBC:  Recent Labs Lab 07/18/13 2351 07/19/13 0937  WBC 5.5 4.6  NEUTROABS  --  2.4  HGB 11.8* 11.9*  HCT 36.7 37.1  MCV 85.7 84.3  PLT 222 208   Cardiac Enzymes:  Recent Labs Lab 07/19/13 0020 07/19/13 1158 07/19/13 1830  TROPONINI 0.34* <0.30 <0.30   BNP (last 3 results)  Recent Labs  09/25/12 0936 02/06/13 1951 07/18/13 2351  PROBNP 6304.0* 1450.0* 2764.0*   CBG: No results found for this basename: GLUCAP,  in the last 168 hours  Recent Results (from the past 240 hour(s))  CULTURE, BLOOD (ROUTINE X 2)     Status: None   Collection Time    07/19/13 12:55 AM      Result Value Ref Range Status   Specimen Description BLOOD RIGHT ANTECUBITAL   Final   Special Requests BOTTLES DRAWN AEROBIC AND ANAEROBIC 10CC EA   Final   Culture  Setup Time     Final   Value: 07/19/2013 08:35     Performed at [facility]  Culture     Final   Value:        BLOOD CULTURE RECEIVED NO GROWTH TO DATE CULTURE WILL BE HELD FOR 5 DAYS BEFORE ISSUING A FINAL NEGATIVE REPORT     Performed at [facility]   Report Status PENDING   Incomplete  CULTURE, BLOOD (ROUTINE X 2)     Status: None   Collection Time    07/19/13  1:05 AM      Result Value Ref Range Status   Specimen Description BLOOD RIGHT HAND   Final   Special Requests     Final   Value: BOTTLES DRAWN AEROBIC AND ANAEROBIC 10CC AER,5CC ANA   Culture  Setup Time     Final   Value: 07/19/2013 08:35     Performed at [facility]   Culture     Final   Value:        BLOOD CULTURE RECEIVED NO GROWTH TO DATE CULTURE WILL BE HELD FOR 5 DAYS BEFORE ISSUING A FINAL NEGATIVE REPORT     Performed at [facility]   Report  Status PENDING   Incomplete  URINE CULTURE     Status: None   Collection Time    07/19/13  1:46 AM      Result Value Ref Range Status   Specimen Description URINE, CLEAN CATCH   Final   Special Requests NONE   Final   Culture  Setup Time     Final   Value: 07/19/2013 02:14     Performed at [facility] Count     Final   Value: 60,000 COLONIES/ML     Performed at [facility]   Culture     Final   Value: ESCHERICHIA COLI     Performed at [facility]   Report Status 07/21/2013 FINAL   Final   Organism ID, Bacteria ESCHERICHIA COLI   Final     Studies: Ct Coronary Morp W/cta Cor W/score W/ca W/cm &/or Wo/cm  07/22/2013   ADDENDUM REPORT: 07/22/2013 18:57  CLINICAL DATA:  Chest Pain, IV Drug User with bioprosthetic valves in aortic, tricuspid and mitral positions. Unable to visualize LM at catheterization. New decrease in EF 35%  EXAM: Cardiac CTA  MEDICATIONS: Sub lingual nitro. 4mg  and lopressor 5mg  Meds limited by hypotension  TECHNIQUE: The patient was scanned on a Philips 256 slice scanner. Gantry rotation speed was 270 msecs. Collimation was .83mm. A 100 kV prospective scan was triggered in the descending thoracic aorta at 111 HU's with 5% padding centered around 78% of the R-R interval. Average HR during the scan was 78 bpm. The 3D data set was interpreted on a dedicated work station using MPR, MIP and VRT modes. A total of 80cc of contrast was used.  FINDINGS: Non-cardiac: See separate report from [facility]. No significant findings on limited lung and soft tissue windows.  Coronary Arteries: Right dominant with no anomalies  LM: Patent superiorly displaced by large periaortic abscess vs pseudoaneurysm.  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 It  extends between the aorta and the RVOT and may be causing some outflow tract obstruction  In near AP dimension it measures 4.5 cephalad to caudal and 2.5 cm  left to right It is pushing the LM ostia superiorly at some what of a sharp angle However there is normal antegrade flow to the LM, LAD and circumflex.  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 Measures 4.5 x 2.5 cm The ascending aortic root above the  Coronary ostia only measures 2.6 cm and is normal through the arch  2) Normal antegrade flow to the RCA, LM, LAD and circumflex with malposition of the LM and LAD due to the pseudo/mycotic aneurysm  3) Normal ascending aorta and arch above the coronary insertions  Charlton Haws   Electronically Signed   By: Charlton Haws M.D.   On: 07/22/2013 18:57   07/22/2013   EXAM: OVER-READ INTERPRETATION  CT CHEST  The following report is an over-read performed by radiologist Dr. Maryelizabeth Rowan [facility] on 07/22/2013. This over-read does not include interpretation of cardiac or coronary anatomy or pathology. The coronary CT has interpretation by the cardiologist is attached.  COMPARISON:  CTA 07/19/2013  FINDINGS: No suspicious pulmonary nodules. There is linear atelectasis at the right lung base. No axillary supraclavicular lymphadenopathy. Midline sternotomy noted with some angulation.  IMPRESSION: 1. Right phases are linear atelectasis. 2. Midline sternotomy with angulation.  Electronically Signed: By: Genevive Bi M.D. On: 07/22/2013 16:24   Echo  Left ventricle: The cavity size was normal. Wall thickness was normal. Systolic function was moderately to severely reduced. The estimated ejection fraction was in the range of 30% to  35%. Incoordinate septal motion. The study is not technically sufficient to allow evaluation of LV diastolic function. - Aortic valve: Bioprosthetic aortic valve. Small septally located paravalvular leak. Peak and mean gradients of 28 and 16 mmHg, respectively. Normal jet contour, AT <100 msec, no suggestion of obstruction. There was trivial regurgitation. - Mitral valve: Bioprosthetic mitral valve, well-seated. Trivial regurgitation. No obstruction. - Right ventricle: The cavity size was normal. RV systolic pressure (S, est): 44 mm Hg. - Tricuspid valve: Bioprosthetic valve. There is sinusodal flow in the tricuspid valve. There was moderate regurgitation. - Pulmonary arteries: PA peak pressure: 44 mm Hg (S). - Inferior vena cava: The vessel was dilated. The respirophasic diameter changes were blunted (< 50%), consistent with elevated central venous pressure.  Impressions:  - Compared to her prior echo in 04/2012, there is a new cardiomyopathy with global hypokinesis and inferoseptal akinesis. LVEF of 30-35%. Her bioprosthetic mitral, aortic and tricuspid valves appear stable - there is persistent tricuspid leaflet dysfunction with sinusoidal flow across the valve.  Scheduled Meds: . amoxicillin  500 mg Oral TID  . aspirin EC  81 mg Oral Daily  . furosemide  40 mg Oral Daily  . levothyroxine  112 mcg Oral QAC breakfast  . pneumococcal 23 valent vaccine  0.5 mL Intramuscular Tomorrow-1000  . sodium chloride  3 mL Intravenous Q12H  . sodium chloride  3 mL Intravenous Q12H   Continuous Infusions:    Time spent: 25 minutes  Theophil Thivierge L  [facility] Pager [phone]. If 7PM-7AM, please contact night-coverage at [email or web address], password [facility] 07/24/2013, 2:05 PM  LOS: 5 days

## 2013-07-24 NOTE — Progress Notes (Signed)
Chaplain responded to spiritual care consult from physician saying pt had been tearful and rwould like chaplain support. Pt has faced many adversities in her life including loss of her parents by gun violence when she was still a child. She acknowledges prior drug use, time in jail, and episodes of homelessness. Pt is accepting of all that has happened and very secure in her faith in God. She showed chaplain her Bible. Pt has three children and five grandchildren whom she loves very much and wants to spend as much time with as possible. She is trying to arrange time for all three children to be with her together so she can communicate her decision not to seek further medical interventions but to have hospice care at home. Pt's chief concerns are about her siblings. She is concerned for her brother in prison and wants to still be alive when he is released a year from now. She is concerned for her sister and tense relationships in sister's family. We had prayer together about these concerns. Pt expressed great appreciation for the visit.

## 2013-07-25 ENCOUNTER — Telehealth: Payer: Self-pay

## 2013-07-25 DIAGNOSIS — I330 Acute and subacute infective endocarditis: Secondary | ICD-10-CM

## 2013-07-25 LAB — CULTURE, BLOOD (ROUTINE X 2)
Culture: NO GROWTH
Culture: NO GROWTH

## 2013-07-25 NOTE — Care Management Note (Addendum)
  Page 2 of 2   07/26/2013     3:40:41 PM CARE MANAGEMENT NOTE 07/26/2013  Patient:  [name], [name]   Account Number:  [id number]  Date Initiated:  07/19/2013  Documentation initiated by:  ROBARGE,COURTNEY  Subjective/Objective Assessment:   Patient admitted with shortness of breath.  Positive troponins on admit. Lives at home with friends.     Action/Plan:   Will follow for discharge needs pending PT/OT evals and physician orders.  Patient is currently without insurance.   Anticipated DC Date:  07/25/2013   Anticipated DC Plan:  HOME W HOSPICE CARE         Choice offered to / List presented to:          [facility] arranged  HH-1 RN  HH-6 SOCIAL WORKER      HH agency  [facility] [place]   Status of service:  Completed, signed off Medicare Important Message given?   (If response is "NO", the following Medicare IM given date fields will be blank) Date Medicare IM given:   Medicare IM given by:   Date Additional Medicare IM given:   Additional Medicare IM given by:    Discharge Disposition:  HOME W HOSPICE CARE  Per UR Regulation:  Reviewed for med. necessity/level of care/duration of stay  If discussed at Long Length of Stay Meetings, dates discussed:   07/26/2013    Comments:  Donato Schultz RN, BSN, MSHL, CCM  Nurse - Case Manager,  (Unit [facility])  [phone]  07/26/2013 Home Hospice appt moved to 3:30 today. Hassie Bruce / Hospice will order E-tank / oxygen for patient. Patient plans d/c to friends home / Proofreader. Demographics and phone verified with patient. Patient gives additional phone # / DTR/Carolyn [phone]. AD completed and notorized by Chaplain services prior to d/c. Hospice working on getting approval for medications as patient has used [facility] MATCH 1x/year option 02/2013. Other Med option:  [facility] / orange Card - information provided to Clifton Custard, SW for Hospice at [place].   Devory Mckinzie RN, BSN, MSHL, CCM  Nurse - Case  Manager,  (Unit [phone]  07/25/2013 Social:  Homeless and currently lives with [name] friend. (Please see Dr. Bruna Potter note for details) Home DME:  walker borrowed from neighbor, [facility], BP Cuff Dispositon Plan:  Home Hospice Care Referral called to  [facility] [place] ContactDarnelle Maffucci [phone].  Confirmed will take Self Pay patient CM left VMM with Hassie Bruce:  [phone]  (IP [place])

## 2013-07-25 NOTE — Progress Notes (Signed)
Patient [name]      DOB: 17-Dec-1970      QZE:[id number]   Met with patient and her three adult children.  Explained her current medical conditions, its limitations and what we will expect to best of our knowledge in the future.  Patient was able to confidently tell her children she desires a do not resuscitate status, she wants to be cremated, and she does not want to transition to her sisters home at any time for long term care.  She does what to maintain a relationship with her sister but does not want to be at her home.  We empowered her to [place] [facility] hosp[facility] home if and when needed to meet the needs of her care in a neutral environment.    I answered all questions and concerns and offered emotional support.  I had asked the chaplain on call to be available and turned the family over to him to complete an advanced care packet.    I have left a Yellow DNR on the chart and will provide a MOST for for the patient's review.  Total time 300 pm- 322 pm  Greater than 50%  of this time was spent counseling and coordinating care related to the above assessment and plan.  Fabienne Nolasco L. Lovena Le, MD MBA The Palliative Medicine Team at [facility] Phone: [phone] Pager: [phone]

## 2013-07-25 NOTE — Progress Notes (Signed)
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  Looking forward to going home when arrangements are made.  She is in no acute distress.  She does have chest discomfort when she lies flat.  CURRENT MEDS . amoxicillin  500 mg Oral TID  . aspirin EC  81 mg Oral Daily  . furosemide  40 mg Oral Daily  . levothyroxine  112 mcg Oral QAC breakfast  . pneumococcal 23 valent vaccine  0.5 mL Intramuscular Tomorrow-1000  . sodium chloride  3 mL Intravenous Q12H  . sodium chloride  3 mL Intravenous Q12H    OBJECTIVE  Filed Vitals:   07/24/13 1410 07/24/13 2030 07/25/13 0457 07/25/13 0537  BP: 106/72 103/68  92/58  Pulse: 82 82  74  Temp: 98 F (36.7 C) 98 F (36.7 C)  98.2 F (36.8 C)  TempSrc: Oral Oral  Oral  Resp: 20 18  18   Height:      Weight:   165 lb 6.4 oz (75.025 kg)   SpO2: 99% 96%  97%    Intake/Output Summary (Last 24 hours) at 07/25/13 1017 Last data filed at 07/25/13 0910  Gross per 24 hour  Intake   1320 ml  Output    800 ml  Net    520 ml   Filed Weights   07/23/13 0433 07/24/13 0606 07/25/13 0457  Weight: 167 lb 3.2 oz (75.841 kg) 167 lb 11.2 oz (76.068 kg) 165 lb 6.4 oz (75.025 kg)    PHYSICAL EXAM  General: Pleasant, NAD. Neuro: Alert and oriented X 3. Moves all extremities spontaneously. Psych: Normal affect. HEENT:  Normal  Neck: Supple without bruits or JVD. Lungs:  Resp regular and unlabored, CTA. Heart: RRR .  Grade 2/6 holosystolic murmur at the left sternal edge Abdomen: Soft, non-tender, non-distended, BS + x 4.  Extremities: No clubbing, cyanosis or edema. DP/PT/Radials 2+ and equal bilaterally.  Accessory  Clinical Findings  CBC No results found for this basename: WBC, NEUTROABS, HGB, HCT, MCV, PLT,  in the last 72 hours Basic Metabolic Panel No results found for this basename: NA, K, CL, CO2, GLUCOSE, BUN, CREATININE, CALCIUM, MG, PHOS,  in the last 72 hours Liver Function Tests No results found for this basename: AST, ALT, ALKPHOS, BILITOT, PROT, ALBUMIN,  in the last 72 hours No results found for this basename: LIPASE, AMYLASE,  in the last 72 hours Cardiac Enzymes No results found for this basename: CKTOTAL, CKMB, CKMBINDEX, TROPONINI,  in the last 72 hours BNP No components found with this basename: POCBNP,  D-Dimer No results found for this basename: DDIMER,  in the last 72 hours Hemoglobin A1C No results found for this basename: HGBA1C,  in the last 72 hours Fasting Lipid Panel No results found for this basename: CHOL, HDL, LDLCALC, TRIG, CHOLHDL, LDLDIRECT,  in the last 72 hours Thyroid Function Tests No results found for this basename: TSH, T4TOTAL, FREET3, T3FREE, THYROIDAB,  in the last 72 hours  TELE    ECG     Radiology/Studies  Dg Chest 2 View  07/19/2013   CLINICAL DATA:  SHORTNESS OF  BREATH BACK PAIN  EXAM: CHEST  2 VIEW  COMPARISON:  Prior radiograph from 02/11/2013  FINDINGS: Median sternotomy wires with underlying prostatic bowels are noted, unchanged. Pacemaker generator overlies the left abdomen, also stable. Mediastinal silhouette within normal limits.  Lungs are normally inflated. Patchy infiltrate present within the right lower lobe, suspicious for pneumonia. No other definite focal infiltrates. No pulmonary edema. Minimal blunting of the right costophrenic angle is suggestive of a small right pleural effusion. Mild elevation of the lateral right hemidiaphragm suggested possible subpulmonic component. No pneumothorax.  No acute osseus abnormality.  IMPRESSION: 1. Patchy right lower lobe infiltrate, suspicious for pneumonia. 2. Blunting of the right costophrenic  angle, suggestive of associated small parapneumonic effusion.   Electronically Signed   By: Rise Mu M.D.   On: 07/19/2013 00:39   Ct Angio Chest W/cm &/or Wo Cm  07/19/2013   CLINICAL DATA:  Shortness of breath for 4 days. Back pain between the shoulder blades. Nausea.  EXAM: CT ANGIOGRAPHY CHEST WITH CONTRAST  TECHNIQUE: Multidetector CT imaging of the chest was performed using the standard protocol during bolus administration of intravenous contrast. Multiplanar CT image reconstructions and MIPs were obtained to evaluate the vascular anatomy.  CONTRAST:  80mL OMNIPAQUE IOHEXOL 350 MG/ML SOLN  COMPARISON:  02/07/2013  FINDINGS: Technically adequate study with good opacification of the central and segmental pulmonary arteries. No focal filling defects. No evidence of significant pulmonary embolus.  Mild cardiac enlargement. Postoperative changes in the mediastinum with mitral valve replacement. Aortic valve replacement. Normal caliber thoracic aorta without evidence of dissection. Great vessel origins are patent. No significant lymphadenopathy in the chest. Esophagus is decompressed. There is suggestion of mild esophageal wall thickening which could indicate esophagitis. Small right pleural effusion. Atelectasis in both lung bases. Patchy scarring versus consolidation in the right lung base. Airways appear patent. No pneumothorax.  Review of the MIP images confirms the above findings.  IMPRESSION: No evidence of significant pulmonary embolus. Cardiac enlargement. Atelectasis and/ infiltration in the lung bases.   Electronically Signed   By: Burman Nieves M.D.   On: 07/19/2013 04:03   Ct Coronary Morp W/cta Cor W/score W/ca W/cm &/or Wo/cm  07/22/2013   ADDENDUM REPORT: 07/22/2013 18:57  CLINICAL DATA:  Chest Pain, IV Drug User with bioprosthetic valves in aortic, tricuspid and mitral positions. Unable to visualize LM at catheterization. New decrease in EF 35%  EXAM: Cardiac CTA  MEDICATIONS: Sub  lingual nitro. 4mg  and lopressor 5mg  Meds limited by hypotension  TECHNIQUE: The patient was scanned on a Philips 256 slice scanner. Gantry rotation speed was 270 msecs. Collimation was .9mm. A 100 kV prospective scan was triggered in the descending thoracic aorta at 111 HU's with 5% padding centered around 78% of the R-R interval. Average HR during the scan was 78 bpm. The 3D data set was interpreted on a dedicated work station using MPR, MIP and VRT modes. A total of 80cc of contrast was used.  FINDINGS: Non-cardiac: See separate report from [facility]. No significant findings on limited lung and soft tissue windows.  Coronary Arteries: Right dominant with no anomalies  LM: Patent superiorly displaced by large periaortic abscess vs pseudoaneurysm.  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 It extends between the aorta  and the RVOT and may be causing some outflow tract obstruction  In near AP dimension it measures 4.5 cephalad to caudal and 2.5 cm left to right It is pushing the LM ostia superiorly at some what of a sharp angle However there is normal antegrade flow to the LM, LAD and circumflex.  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 Measures 4.5 x 2.5 cm The ascending aortic root above the  Coronary ostia only measures 2.6 cm and is normal through the arch  2) Normal antegrade flow to the RCA, LM, LAD and circumflex with malposition of the LM and LAD due to the pseudo/mycotic aneurysm  3) Normal ascending aorta and arch above the coronary insertions   Charlton HawsPeter Nishan   Electronically Signed   By: Charlton HawsPeter  Nishan M.D.   On: 07/22/2013 18:57   07/22/2013   EXAM: OVER-READ INTERPRETATION  CT CHEST  The following report is an over-read performed by radiologist Dr. Maryelizabeth RowanStewart Edmundsof [facility] on 07/22/2013. This over-read does not include interpretation of cardiac or coronary anatomy or pathology. The coronary CT has interpretation by the cardiologist is attached.  COMPARISON:  CTA 07/19/2013  FINDINGS: No suspicious pulmonary nodules. There is linear atelectasis at the right lung base. No axillary supraclavicular lymphadenopathy. Midline sternotomy noted with some angulation.  IMPRESSION: 1. Right phases are linear atelectasis. 2. Midline sternotomy with angulation.  Electronically Signed: By: Genevive BiStewart  Edmunds M.D. On: 07/22/2013 16:24    ASSESSMENT AND PLAN 1. 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  Not an operative candidate 2. newly diagnosed cardiomyopathy with ejection fraction 30-35%, symptomatically improved with Lasix diuresis 3. persistent chest pain worse with lying flat  Plan: As noted in previous notes, hospice consult has been obtained.  Awaiting discharge home with hospice.   Signed, Cassell Clementhomas Vuk Skillern MD

## 2013-07-25 NOTE — Progress Notes (Signed)
[facility] PROGRESS NOTE  [name]:[id number] DOB: Mar 04, 1970 DOA: 07/19/2013 PCP: Doris Cheadle, MD  Assessment/Plan:     Acute systolic CHF.  EF shows new systolic dysfunction 35%. 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 Home when home hospice can be arranged. Dr. Ladona Ridgel to arrange Monday. Pt is uninsured. Severe TR. Acute kidney injury: improved   Hypothyroidism: TSH high. Synthroid increased   Substance abuse: denies current IVDA. 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  Consultants:  Cardiology  PMT  Procedures:     Antibiotics:  levaquin 7/7-7/8  amox- LIFELONG  HPI/Subjective: Pain better. Has spoken to family about transitioning to hospice.  Objective: Filed Vitals:   07/25/13 0537  BP: 92/58  Pulse: 74  Temp: 98.2 F (36.8 C)  Resp: 18    Intake/Output Summary (Last 24 hours) at 07/25/13 0828 Last data filed at 07/25/13 0600  Gross per 24 hour  Intake   1080 ml  Output    800 ml  Net    280 ml   Filed Weights   07/23/13 0433 07/24/13 0606 07/25/13 0457  Weight: 75.841 kg (167 lb 3.2 oz) 76.068 kg (167 lb 11.2 oz) 75.025 kg (165 lb 6.4 oz)    Exam:   General:  Cooperative. - family meeting today at 6??  Cardiovascular: RRR murmur present  Respiratory: CTA without WRR  Abdomen: +BS, soft  Ext: minimal edema  Basic Metabolic Panel:  Recent Labs Lab 07/18/13 2351 07/19/13 0937 07/20/13 0515 07/21/13 0903 07/22/13 0450  NA 140 137 136* 137 138  K 4.5 4.7 4.9 4.4 4.1  CL 101 101 96 96 100  CO2 26 20 27 23 23   GLUCOSE 87 82 103* 125* 87  BUN 16 17 26* 23 17  CREATININE 1.23* 0.99 1.65* 1.15* 1.12*  CALCIUM 9.5 8.8 9.2  9.8 9.3   Liver Function Tests:  Recent Labs Lab 07/19/13 0937  AST 22  ALT 17  ALKPHOS 97  BILITOT 0.4  PROT 7.8  ALBUMIN 3.8   No results found for this basename: LIPASE, AMYLASE,  in the last 168 hours No results found for this basename: AMMONIA,  in the last 168 hours CBC:  Recent Labs Lab 07/18/13 2351 07/19/13 0937  WBC 5.5 4.6  NEUTROABS  --  2.4  HGB 11.8* 11.9*  HCT 36.7 37.1  MCV 85.7 84.3  PLT 222 208   Cardiac Enzymes:  Recent Labs Lab 07/19/13 0020 07/19/13 1158 07/19/13 1830  TROPONINI 0.34* <0.30 <0.30   BNP (last 3 results)  Recent Labs  09/25/12 0936 02/06/13 1951 07/18/13 2351  PROBNP 6304.0* 1450.0* 2764.0*   CBG: No results found for this basename: GLUCAP,  in the last 168 hours  Recent Results (from the past 240 hour(s))  CULTURE, BLOOD (ROUTINE X 2)     Status: None   Collection Time    07/19/13 12:55 AM      Result Value Ref Range Status   Specimen Description BLOOD RIGHT ANTECUBITAL   Final   Special Requests BOTTLES DRAWN AEROBIC AND ANAEROBIC 10CC EA   Final   Culture  Setup Time     Final   Value: 07/19/2013 08:35     Performed  at [facility]   Culture     Final   Value: NO GROWTH 5 DAYS     Performed at [facility]   Report Status 07/25/2013 FINAL   Final  CULTURE, BLOOD (ROUTINE X 2)     Status: None   Collection Time    07/19/13  1:05 AM      Result Value Ref Range Status   Specimen Description BLOOD RIGHT HAND   Final   Special Requests     Final   Value: BOTTLES DRAWN AEROBIC AND ANAEROBIC 10CC AER,5CC ANA   Culture  Setup Time     Final   Value: 07/19/2013 08:35     Performed at [facility]   Culture     Final   Value: NO GROWTH 5 DAYS     Performed at [facility]   Report Status 07/25/2013 FINAL   Final  URINE CULTURE     Status: None   Collection Time    07/19/13  1:46 AM      Result Value Ref Range Status   Specimen Description URINE, CLEAN CATCH   Final   Special  Requests NONE   Final   Culture  Setup Time     Final   Value: 07/19/2013 02:14     Performed at [facility]   Colony Count     Final   Value: 60,000 COLONIES/ML     Performed at [facility]   Culture     Final   Value: ESCHERICHIA COLI     Performed at [facility]   Report Status 07/21/2013 FINAL   Final   Organism ID, Bacteria ESCHERICHIA COLI   Final     Studies: No results found. Echo  Left ventricle: The cavity size was normal. Wall thickness was normal. Systolic function was moderately to severely reduced. The estimated ejection fraction was in the range of 30% to 35%. Incoordinate septal motion. The study is not technically sufficient to allow evaluation of LV diastolic function. - Aortic valve: Bioprosthetic aortic valve. Small septally located paravalvular leak. Peak and mean gradients of 28 and 16 mmHg, respectively. Normal jet contour, AT <100 msec, no suggestion of obstruction. There was trivial regurgitation. - Mitral valve: Bioprosthetic mitral valve, well-seated. Trivial regurgitation. No obstruction. - Right ventricle: The cavity size was normal. RV systolic pressure (S, est): 44 mm Hg. - Tricuspid valve: Bioprosthetic valve. There is sinusodal flow in the tricuspid valve. There was moderate regurgitation. - Pulmonary arteries: PA peak pressure: 44 mm Hg (S). - Inferior vena cava: The vessel was dilated. The respirophasic diameter changes were blunted (< 50%), consistent with elevated central venous pressure.  Impressions:  - Compared to her prior echo in 04/2012, there is a new cardiomyopathy with global hypokinesis and inferoseptal akinesis. LVEF of 30-35%. Her bioprosthetic mitral, aortic and tricuspid valves appear stable - there is persistent tricuspid leaflet dysfunction with sinusoidal flow across the valve.  Scheduled Meds: . amoxicillin  500 mg Oral TID  . aspirin EC  81 mg Oral Daily  . furosemide  40 mg Oral Daily   . levothyroxine  112 mcg Oral QAC breakfast  . pneumococcal 23 valent vaccine  0.5 mL Intramuscular Tomorrow-1000  . sodium chloride  3 mL Intravenous Q12H  . sodium chloride  3 mL Intravenous Q12H   Continuous Infusions:    Time spent: 25 minutes  Ramar Nobrega  [facility] [phone]. If 7PM-7AM, please contact night-coverage at  [email or web address], password [facility] 07/25/2013, 8:28 AM  LOS: 6 days

## 2013-07-25 NOTE — Telephone Encounter (Signed)
Eyvette from [employer] and hospice home called This patient is going home on hospice and she would like to  Know if you will be her attending physician? Please return her call @ [phone]

## 2013-07-25 NOTE — Telephone Encounter (Signed)
Please call Stacy from [facility] hospice regarding this patient They need to know if you are going to be the attending physician Please call her back ASAP @ [phone]

## 2013-07-25 NOTE — Progress Notes (Signed)
Chaplain participated in Goals of Care meeting at request of Dr. Ladona Ridgel. Arrived early and presented pt with a prayer shawl which she greatly appreciated. Pt's three children - all grown - participated in the meeting. They are all on the same page -- sad but accepting and wanting to focus on quality time with their mom. I reviewed Advance Directive with them and they completed it while all together. Witnessed and notarized. Copy in chart.

## 2013-07-26 DIAGNOSIS — Z515 Encounter for palliative care: Secondary | ICD-10-CM

## 2013-07-26 LAB — CREATININE, SERUM
Creatinine, Ser: 1.15 mg/dL — ABNORMAL HIGH (ref 0.50–1.10)
GFR calc Af Amer: 66 mL/min — ABNORMAL LOW (ref >90)
GFR calc non Af Amer: 57 mL/min — ABNORMAL LOW (ref >90)

## 2013-07-26 MED ORDER — OXYCODONE-ACETAMINOPHEN 5-325 MG PO TABS: 1.0000 | ORAL_TABLET | ORAL | Status: DC | PRN

## 2013-07-26 MED ORDER — LORAZEPAM 0.5 MG PO TABS: 0.5000 mg | ORAL_TABLET | Freq: Four times a day (QID) | ORAL | Status: DC | PRN

## 2013-07-26 MED ORDER — FUROSEMIDE 40 MG PO TABS: 40.0000 mg | ORAL_TABLET | Freq: Every day | ORAL | Status: DC

## 2013-07-26 MED ORDER — LEVOTHYROXINE SODIUM 112 MCG PO TABS: 112.0000 ug | ORAL_TABLET | Freq: Every day | ORAL | Status: DC

## 2013-07-26 NOTE — Progress Notes (Signed)
Patient taken out of hospital for discharge via wheelchair.  

## 2013-07-26 NOTE — Progress Notes (Signed)
Yellow DNR paper given to patient.

## 2013-07-26 NOTE — Progress Notes (Signed)
Notified by [facility] Crystal of patient's choice for [facility]) services after discharge. Patient information reviewed with Dr. Clifton James [facility] Medical Director, pt eligible with Dx: Heart disease nos. Writer met with patient and initiated education related to hospice services, philosophy of care and team approach to care with good understanding voiced by patient. Per patient and chart review plan is for D/C today via [facility]. Pt is now using oxygen PRN to control dyspnea. DME includes: Oxygen concentrator, nasal cannula, humidifier bottle, e tanks. DME needs called to [facility] DME manager for delivery today. Contact made with [facility] hospital rep Jermaine with request for portable e tank be delivered to patient's room for discharge. Discharge summary faxed to [facility] referral center by writer.  Medication coverage discussed with [facility] SW Norva Riffle, pt chose to use [facility] [address] as her pharmacy.  Hospice to pay for medications as patient has no income. PCP: Dr. Lorayne Marek has agreed to hospice order. [facility] contact information and  phone numbers given to patient during visit. All above information shared with [facility] Crystal.  Flo Shanks RN 07/26/2013 10:00am [facility] hospital Liaison [phone]

## 2013-07-26 NOTE — Progress Notes (Signed)
Home discharge instructions discussed with patient. Patient being discharge to home with hospice care. Discussed diet, activity, daily weights, and medications. Prescriptions for Lasix, Synthroid, Percocet and Ativan given to patient. Patient states she is [facility] [address] in [place]. Patient awaiting oxygen tank delivery to room. Patient verbally understands all discharge instructions and is able to use teachback with no assistance.

## 2013-07-26 NOTE — Discharge Summary (Signed)
Physician Discharge Summary  [name] WUJ:[id number] DOB: 1970-01-23 DOA: 07/19/2013  PCP: Doris Cheadle, MD  Admit date: 07/19/2013 Discharge date: 07/26/2013  Time spent: 35 minutes  Recommendations for Outpatient Follow-up:  1. 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 Patient also occasionally has chest pressure. Patient states she gets short of breath on lying flat and on minimal exertion. At rest she is symptom-free. In the ER patient had CT angiogram of the chest which shows nonspecific infiltrates. Patient's troponin has been positive. Patient presently chest pain-free. Cardiologist on call was consulted and patient has been admitted for further management. Patient states few weeks ago she had some nausea vomiting. Denies any abdominal pain diarrhea. Denies any recent procedures. Patient admits to taking marijuana but denies any IV drug abuse. Patient has had prior history of IV drug abuse and has been free from IV drug abuse for many years as per the patient. Patient otherwise denies any headache visual symptoms focal deficits.   Hospital Course:  Acute systolic CHF. EF  shows new systolic dysfunction 35%. 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 Home with hospice  Severe TR.  Acute kidney injury: improved  Hypothyroidism: TSH high. Synthroid increased  Substance abuse: denies current IVDA. 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 If you have any questions about your discharge medications or the care you received while you were in the hospital after you are discharged, you can call the unit and asked to speak with the hospitalist on call if the hospitalist that took care of you is not available. Once you are discharged, your primary care physician will handle any further medical issues. Please note that NO REFILLS for any discharge medications will be authorized once you are discharged, as it is imperative that you return to your primary care physician (or establish a relationship with a primary care physician if you do not have one) for your aftercare needs so that they can reassess your need for medications and monitor your lab values.  Discharge Instructions   Diet - low sodium heart healthy  Complete by:  As directed      Discharge instructions    Complete by:  As directed   Home with hospice     Increase activity slowly    Complete by:  As directed             Medication List    STOP taking these medications       DAYQUIL MULTI-SYMPTOM COLD/FLU PO     naproxen sodium 220 MG tablet  Commonly known as:   ANAPROX     promethazine 25 MG tablet  Commonly known as:  PHENERGAN     SLEEP AID PO      TAKE these medications       amoxicillin 500 MG capsule  Commonly known as:  AMOXIL  Take 1 capsule (500 mg total) by mouth 3 (three) times daily.     aspirin 81 MG chewable tablet  Chew 81 mg by mouth daily.     furosemide 40 MG tablet  Commonly known as:  LASIX  Take 1 tablet (40 mg total) by mouth daily.     levothyroxine 112 MCG tablet  Commonly known as:  SYNTHROID, LEVOTHROID  Take 1 tablet (112 mcg total) by mouth daily before breakfast.     LORazepam 0.5 MG tablet  Commonly known as:  ATIVAN  Take 1 tablet (0.5 mg total) by mouth every 6 (six) hours as needed for anxiety or sleep.     oxyCODONE-acetaminophen 5-325 MG per tablet  Commonly known as:  PERCOCET/ROXICET  Take 1-2 tablets by mouth every 4 (four) hours as needed (1 tab for moderate pain; 2 tabs for severe pain).       No Known Allergies    The results of significant diagnostics from this hospitalization (including imaging, microbiology, ancillary and laboratory) are listed below for reference.    Significant Diagnostic Studies: Dg Chest 2 View  07/19/2013   CLINICAL DATA:  SHORTNESS OF BREATH BACK PAIN  EXAM: CHEST  2 VIEW  COMPARISON:  Prior radiograph from 02/11/2013  FINDINGS: Median sternotomy wires with underlying prostatic bowels are noted, unchanged. Pacemaker generator overlies the left abdomen, also stable. Mediastinal silhouette within normal limits.  Lungs are normally inflated. Patchy infiltrate present within the right lower lobe, suspicious for pneumonia. No other definite focal infiltrates. No pulmonary edema. Minimal blunting of the right costophrenic angle is suggestive of a small right pleural effusion. Mild elevation of the lateral right hemidiaphragm suggested possible subpulmonic component. No pneumothorax.  No acute osseus abnormality.  IMPRESSION: 1. Patchy right lower lobe infiltrate,  suspicious for pneumonia. 2. Blunting of the right costophrenic angle, suggestive of associated small parapneumonic effusion.   Electronically Signed   By: Rise Mu M.D.   On: 07/19/2013 00:39   Ct Angio Chest W/cm &/or Wo Cm  07/19/2013   CLINICAL DATA:  Shortness of breath for 4 days. Back pain between the shoulder blades. Nausea.  EXAM: CT ANGIOGRAPHY CHEST WITH CONTRAST  TECHNIQUE: Multidetector CT imaging of the chest was performed using the standard protocol during bolus administration of intravenous contrast. Multiplanar CT image reconstructions and MIPs were obtained to evaluate the vascular anatomy.  CONTRAST:  80mL OMNIPAQUE IOHEXOL 350 MG/ML SOLN  COMPARISON:  02/07/2013  FINDINGS: Technically adequate study with good opacification of the central and segmental pulmonary arteries. No focal filling defects. No evidence of significant pulmonary embolus.  Mild cardiac enlargement. Postoperative changes in the mediastinum with mitral valve replacement. Aortic valve replacement. Normal caliber thoracic aorta without evidence  of dissection. Great vessel origins are patent. No significant lymphadenopathy in the chest. Esophagus is decompressed. There is suggestion of mild esophageal wall thickening which could indicate esophagitis. Small right pleural effusion. Atelectasis in both lung bases. Patchy scarring versus consolidation in the right lung base. Airways appear patent. No pneumothorax.  Review of the MIP images confirms the above findings.  IMPRESSION: No evidence of significant pulmonary embolus. Cardiac enlargement. Atelectasis and/ infiltration in the lung bases.   Electronically Signed   By: Burman NievesWilliam  Stevens M.D.   On: 07/19/2013 04:03   Ct Coronary Morp W/cta Cor W/score W/ca W/cm &/or Wo/cm  07/22/2013   ADDENDUM REPORT: 07/22/2013 18:57  CLINICAL DATA:  Chest Pain, IV Drug User with bioprosthetic valves in aortic, tricuspid and mitral positions. Unable to visualize LM at  catheterization. New decrease in EF 35%  EXAM: Cardiac CTA  MEDICATIONS: Sub lingual nitro. 4mg  and lopressor 5mg  Meds limited by hypotension  TECHNIQUE: The patient was scanned on a Philips 256 slice scanner. Gantry rotation speed was 270 msecs. Collimation was .9mm. A 100 kV prospective scan was triggered in the descending thoracic aorta at 111 HU's with 5% padding centered around 78% of the R-R interval. Average HR during the scan was 78 bpm. The 3D data set was interpreted on a dedicated work station using MPR, MIP and VRT modes. A total of 80cc of contrast was used.  FINDINGS: Non-cardiac: [facility]. No significant findings on limited lung and soft tissue windows.  Coronary Arteries: Right dominant with no anomalies  LM: Patent superiorly displaced by large periaortic abscess vs pseudoaneurysm.  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 It extends between the aorta and the RVOT and may be causing some outflow tract obstruction  In near AP dimension it measures 4.5 cephalad to caudal and 2.5 cm left to right It is pushing the LM ostia superiorly at some what of a sharp angle However there is normal antegrade flow to the LM, LAD and circumflex.  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 Measures 4.5 x 2.5 cm The ascending aortic root above the  Coronary ostia only measures 2.6 cm and is normal through the arch  2) Normal antegrade flow to the RCA, LM, LAD and circumflex with malposition of the LM and LAD due to the  pseudo/mycotic aneurysm  3) Normal ascending aorta and arch above the coronary insertions  Charlton HawsPeter Nishan   Electronically Signed   By: Charlton HawsPeter  Nishan M.D.   On: 07/22/2013 18:57   07/22/2013   EXAM: OVER-READ INTERPRETATION  CT CHEST  The following report is an over-read performed by radiologist Dr. Maryelizabeth RowanStewart [facility]so[facility] on 07/22/2013. This over-read does not include interpretation of cardiac or coronary anatomy or pathology. The coronary CT has interpretation by the cardiologist is attached.  COMPARISON:  CTA 07/19/2013  FINDINGS: No suspicious pulmonary nodules. There is linear atelectasis at the right lung base. No axillary supraclavicular lymphadenopathy. Midline sternotomy noted with some angulation.  IMPRESSION: 1. Right phases  are linear atelectasis. 2. Midline sternotomy with angulation.  Electronically Signed: By: Genevive Bi M.D. On: 07/22/2013 16:24    Microbiology: Recent Results (from the past 240 hour(s))  CULTURE, BLOOD (ROUTINE X 2)     Status: None   Collection Time    07/19/13 12:55 AM      Result Value Ref Range Status   Specimen Description BLOOD RIGHT ANTECUBITAL   Final   Special Requests BOTTLES DRAWN AEROBIC AND ANAEROBIC 10CC EA   Final   Culture  Setup Time     Final   Value: 07/19/2013 08:35     Performed at [facility]   Culture     Final   Value: NO GROWTH 5 DAYS     Performed at [facility]   Report Status 07/25/2013 FINAL   Final  CULTURE, BLOOD (ROUTINE X 2)     Status: None   Collection Time    07/19/13  1:05 AM      Result Value Ref Range Status   Specimen Description BLOOD RIGHT HAND   Final   Special Requests     Final   Value: BOTTLES DRAWN AEROBIC AND ANAEROBIC 10CC AER,5CC ANA   Culture  Setup Time     Final   Value: 07/19/2013 08:35     Performed at [facility]   Culture     Final   Value: NO GROWTH 5 DAYS     Performed at [facility]   Report Status 07/25/2013 FINAL   Final  URINE  CULTURE     Status: None   Collection Time    07/19/13  1:46 AM      Result Value Ref Range Status   Specimen Description URINE, CLEAN CATCH   Final   Special Requests NONE   Final   Culture  Setup Time     Final   Value: 07/19/2013 02:14     Performed at [facility] Count     Final   Value: 60,000 COLONIES/ML     Performed at [facility]   Culture     Final   Value: ESCHERICHIA COLI     Performed at [facility]   Report Status 07/21/2013 FINAL   Final   Organism ID, Bacteria ESCHERICHIA COLI   Final     Labs: Basic Metabolic Panel:  Recent Labs Lab 07/19/13 0937 07/20/13 0515 07/21/13 0903 07/22/13 0450 07/26/13 0412  NA 137 136* 137 138  --   K 4.7 4.9 4.4 4.1  --   CL 101 96 96 100  --   CO2 20 27 23 23   --   GLUCOSE 82 103* 125* 87  --   BUN 17 26* 23 17  --   CREATININE 0.99 1.65* 1.15* 1.12* 1.15*  CALCIUM 8.8 9.2 9.8 9.3  --    Liver Function Tests:  Recent Labs Lab 07/19/13 0937  AST 22  ALT 17  ALKPHOS 97  BILITOT 0.4  PROT 7.8  ALBUMIN 3.8   No results found for this basename: LIPASE, AMYLASE,  in the last 168 hours No results found for this basename: AMMONIA,  in the last 168 hours CBC:  Recent Labs Lab 07/19/13 0937  WBC 4.6  NEUTROABS 2.4  HGB 11.9*  HCT 37.1  MCV 84.3  PLT 208   Cardiac Enzymes:  Recent Labs Lab 07/19/13 1158 07/19/13 1830  TROPONINI <0.30 <0.30   BNP: BNP (last 3  results)  Recent Labs  09/25/12 0936 02/06/13 1951 07/18/13 2351  PROBNP 6304.0* 1450.0* 2764.0*   CBG: No results found for this basename: GLUCAP,  in the last 168 hours     Signed:  Marlin Canary  [facility] 07/26/2013, 9:23 AM

## 2013-07-26 NOTE — Progress Notes (Signed)
Plans in place for palliative care/hospice.  No new cardiac recommendations.  Will sign off now.

## 2013-07-28 ENCOUNTER — Telehealth: Payer: Self-pay | Admitting: Internal Medicine

## 2013-07-28 NOTE — Telephone Encounter (Signed)
Clydie Braun called from Hospice wanting to speak to provider in efforts to get a new medication for pain prescribed for patient....please call her at [phone]

## 2013-08-02 ENCOUNTER — Telehealth: Payer: Self-pay

## 2013-08-02 NOTE — Telephone Encounter (Signed)
Returned call to [name] from hospice She stated she was not sure if she called [place] i told her if she remembers why she called , she can call us back

## 2013-08-09 ENCOUNTER — Telehealth: Payer: Self-pay | Admitting: Emergency Medicine

## 2013-08-09 MED ORDER — POTASSIUM CHLORIDE CRYS ER 10 MEQ PO TBCR
10.0000 meq | EXTENDED_RELEASE_TABLET | Freq: Every day | ORAL | Status: DC
Start: 2013-08-09 — End: 2013-09-20

## 2013-08-09 MED ORDER — FUROSEMIDE 40 MG PO TABS: ORAL_TABLET | ORAL | Status: DC

## 2013-08-09 MED ORDER — LEVOTHYROXINE SODIUM 112 MCG PO TABS: 112.0000 ug | ORAL_TABLET | Freq: Every day | ORAL | Status: DC

## 2013-08-09 NOTE — Telephone Encounter (Signed)
Spoke with Clydie Braun from [facility] and Palliative in regards to increasing pt Lasix. Nurse states pt weight 07/14/13 168 lbs and today 169 lbs with +2 ankle edema,sob. Verbal order per Dr. Orpah Cobb, to increase Lasix to 60 mg tablet today and tomorrow then resume regular dose. Potassium 10 meq added to take daily per Dr. Orpah Cobb. Dr. Gibson Ramp is following pt as well

## 2013-09-01 ENCOUNTER — Telehealth: Payer: Self-pay | Admitting: Internal Medicine

## 2013-09-01 NOTE — Telephone Encounter (Signed)
Pt's hospice nurse would like to get a refill on amoxicillin (AMOXIL) 500 MG capsule. Please f/u with nurse.

## 2013-09-02 ENCOUNTER — Other Ambulatory Visit: Payer: Self-pay | Admitting: *Deleted

## 2013-09-02 ENCOUNTER — Telehealth: Payer: Self-pay | Admitting: *Deleted

## 2013-09-02 MED ORDER — AMOXICILLIN 500 MG PO CAPS: 500.0000 mg | ORAL_CAPSULE | Freq: Three times a day (TID) | ORAL | Status: DC

## 2013-09-02 NOTE — Telephone Encounter (Signed)
Hadassah Pais, RN from [facility] called for a refill for Amoxicillin. Message forwarded.

## 2013-09-02 NOTE — Telephone Encounter (Signed)
Pt.'s nurse called again regarding refill....call was transferred to [facility].Marland KitchenMarland Kitchen

## 2013-09-16 ENCOUNTER — Telehealth: Payer: Self-pay | Admitting: Internal Medicine

## 2013-09-16 NOTE — Telephone Encounter (Signed)
Patient's nurse has called in today requesting that these medications be filled today because the patient is completely out; furosemide (LASIX) 40 MG tablet, levothyroxine (SYNTHROID, LEVOTHROID) 112 MCG tablet and potassium chloride SA (K-DUR,KLOR-CON) 10 MEQ tablet

## 2013-09-20 ENCOUNTER — Other Ambulatory Visit: Payer: Self-pay | Admitting: Emergency Medicine

## 2013-09-20 MED ORDER — POTASSIUM CHLORIDE CRYS ER 10 MEQ PO TBCR: 10.0000 meq | EXTENDED_RELEASE_TABLET | Freq: Every day | ORAL | Status: AC

## 2013-09-20 MED ORDER — FUROSEMIDE 40 MG PO TABS: ORAL_TABLET | ORAL | Status: DC

## 2013-09-20 MED ORDER — LEVOTHYROXINE SODIUM 112 MCG PO TABS: 112.0000 ug | ORAL_TABLET | Freq: Every day | ORAL | Status: DC

## 2013-09-20 NOTE — Telephone Encounter (Signed)
Spoke with hospice nurse, Clydie Braun. Medication requested were reordered and e-scribed to [facility] [place].

## 2013-10-12 ENCOUNTER — Telehealth: Payer: Self-pay | Admitting: Internal Medicine

## 2013-10-12 NOTE — Telephone Encounter (Signed)
Patient's Hospice has called in to request a medication refill on behalf of the patient; patient needs a refill on  levothyroxine (SYNTHROID, LEVOTHROID) 112 MCG tablet & furosemide (LASIX) 40 MG tablet; Patient is also requesting a flu shot;

## 2013-10-12 NOTE — Telephone Encounter (Signed)
Pt requesting med refills on Lasix 40 mg tab, Synthroid 112 mcg Last seen 03/2013

## 2013-10-12 NOTE — Telephone Encounter (Signed)
Left message for pt to return message 

## 2013-10-18 ENCOUNTER — Other Ambulatory Visit: Payer: Self-pay | Admitting: Emergency Medicine

## 2013-10-18 ENCOUNTER — Telehealth: Payer: Self-pay | Admitting: Internal Medicine

## 2013-10-18 MED ORDER — LEVOTHYROXINE SODIUM 112 MCG PO TABS: 112.0000 ug | ORAL_TABLET | Freq: Every day | ORAL | Status: DC

## 2013-10-18 MED ORDER — FUROSEMIDE 40 MG PO TABS: ORAL_TABLET | ORAL | Status: DC

## 2013-10-18 NOTE — Telephone Encounter (Signed)
Pt.'s hospice called again to check on the status of the refill request for Lasix 40 mg tab, Synthroid 112 mcg. Please f/u with pt's nurse.

## 2013-11-04 ENCOUNTER — Telehealth: Payer: Self-pay | Admitting: Internal Medicine

## 2013-11-04 NOTE — Telephone Encounter (Signed)
Hospice has called to inform PCP that patient is experiencing cramping in both legs; Hospice is recommending some potassium; Hospice also wanted to let PCP know patient was scripted medication for sleep; [phone] is [name]'s number

## 2013-11-08 ENCOUNTER — Telehealth: Payer: Self-pay | Admitting: Emergency Medicine

## 2013-11-08 ENCOUNTER — Telehealth: Payer: Self-pay | Admitting: Internal Medicine

## 2013-11-08 NOTE — Telephone Encounter (Signed)
[name] from [facility] called stating that the pt. Needed refills on Synthroid,Lasix and Potassium. Nurse also stated that pt. Has had 2+ edema on both lower legs, also that her weight has been between 160-163 lbs. Nurse would like for our facility to call pt. Back in regards to refills for medications. Please f/u with pt

## 2013-11-08 NOTE — Telephone Encounter (Signed)
Spoke with pt in regards to medication refill Synthroid,Potassium and Lasix. Pt has not been seen in clinic since 03/2013 Pt is following [facility], I will call nurse Clydie Braun for additional information

## 2013-11-14 ENCOUNTER — Other Ambulatory Visit: Payer: Self-pay | Admitting: Internal Medicine

## 2013-11-21 ENCOUNTER — Telehealth: Payer: Self-pay | Admitting: Internal Medicine

## 2013-11-21 NOTE — Telephone Encounter (Signed)
Refill for Lasix, and thyroid medication. Please f/u with pt.

## 2013-11-21 NOTE — Telephone Encounter (Signed)
Nurse from Hospice called stating that pt. Has changed pharmacy, pt. Now uses [facility] on [address] Nurse also stated that pt. Needs a refill for

## 2013-11-28 ENCOUNTER — Ambulatory Visit: Payer: Medicaid Other | Admitting: Internal Medicine

## 2013-12-07 ENCOUNTER — Telehealth: Payer: Self-pay

## 2013-12-07 NOTE — Telephone Encounter (Signed)
Spoke with Clydie Braun from Hospice She reordered her lasix and synthroid Patient is very confused Clydie Braun went ahead and organized her medications for the patient Currently working on someone to come to the house to check on the patient daily

## 2013-12-22 ENCOUNTER — Encounter (HOSPITAL_COMMUNITY): Payer: Self-pay | Admitting: Cardiovascular Disease

## 2014-01-17 ENCOUNTER — Telehealth: Payer: Self-pay

## 2014-01-17 NOTE — Telephone Encounter (Signed)
  Rosey Bath from hospice called  Should this patient be on amox TID permanently? The last script for this was administered in August

## 2014-05-10 ENCOUNTER — Telehealth: Payer: Self-pay | Admitting: *Deleted

## 2014-05-10 NOTE — Telephone Encounter (Signed)
-  Joylene Igo, RN director at [facility] called to get a verbal order that patient is still appropriate for Hospice.  She is needing the order today.   Her number is [phone]

## 2014-05-12 ENCOUNTER — Encounter: Payer: Self-pay | Admitting: *Deleted

## 2014-05-12 NOTE — Progress Notes (Signed)
Verbal order given by Dr. Orpah Cobb to continue hospice care in the home.  Joylene Igo director of nursing called and told.

## 2014-07-18 ENCOUNTER — Telehealth: Payer: Self-pay

## 2014-07-18 NOTE — Telephone Encounter (Signed)
Spoke with Toniann Fail from hospice service Need a verbal order to continue with the next step of the hospice process Verbal order given

## 2014-08-29 ENCOUNTER — Encounter: Payer: Self-pay | Admitting: Pharmacist

## 2014-09-06 ENCOUNTER — Telehealth: Payer: Self-pay | Admitting: Internal Medicine

## 2014-09-06 NOTE — Telephone Encounter (Signed)
Spoke with [employer] from Hospice. Patient was set up with Hospice services after hospitalization in July 2015. The attending MD was Dr. Orpah Cobb but he has since moved and the patient needs a new attending MD to provide hospice orders and management. Patient is scheduled to see Dr. Rennis Golden for overdue yearly visit 10/10/2014.   Informed Cordelia Pen that this would need to be discussed with MD who would make decision if he would provide this service.   Callback # [phone]

## 2014-09-06 NOTE — Telephone Encounter (Signed)
Hospice Nurse is calling

## 2014-09-06 NOTE — Telephone Encounter (Signed)
Spoke with Cordelia Pen - I informed her that I do not provide hospice attending services. There is little else to offer from a cardiac standpoint, therefore I do not need to see her back in September for that appointment - she is not a surgical candidate for her valve endocarditis. Please cancel the appointment.  Thanks  Dr. Rexene Edison

## 2014-09-06 NOTE — Telephone Encounter (Signed)
Appointment cancelled per MD request.

## 2014-10-10 ENCOUNTER — Ambulatory Visit: Payer: Self-pay | Admitting: Internal Medicine

## 2014-10-13 ENCOUNTER — Ambulatory Visit: Payer: Medicaid Other | Attending: Family Medicine | Admitting: Family Medicine

## 2014-10-13 ENCOUNTER — Encounter (HOSPITAL_BASED_OUTPATIENT_CLINIC_OR_DEPARTMENT_OTHER): Payer: Medicaid Other | Admitting: Clinical

## 2014-10-13 ENCOUNTER — Encounter: Payer: Self-pay | Admitting: Family Medicine

## 2014-10-13 VITALS — BP 102/67 | HR 81 | Temp 97.8°F | Resp 16 | Ht 64.0 in | Wt 148.0 lb

## 2014-10-13 DIAGNOSIS — I5022 Chronic systolic (congestive) heart failure: Secondary | ICD-10-CM | POA: Insufficient documentation

## 2014-10-13 DIAGNOSIS — Z114 Encounter for screening for human immunodeficiency virus [HIV]: Secondary | ICD-10-CM | POA: Diagnosis not present

## 2014-10-13 DIAGNOSIS — F418 Other specified anxiety disorders: Secondary | ICD-10-CM

## 2014-10-13 DIAGNOSIS — E039 Hypothyroidism, unspecified: Secondary | ICD-10-CM | POA: Diagnosis not present

## 2014-10-13 DIAGNOSIS — R609 Edema, unspecified: Secondary | ICD-10-CM | POA: Diagnosis not present

## 2014-10-13 DIAGNOSIS — Z0000 Encounter for general adult medical examination without abnormal findings: Secondary | ICD-10-CM

## 2014-10-13 DIAGNOSIS — F1210 Cannabis abuse, uncomplicated: Secondary | ICD-10-CM | POA: Diagnosis not present

## 2014-10-13 DIAGNOSIS — F419 Anxiety disorder, unspecified: Principal | ICD-10-CM

## 2014-10-13 DIAGNOSIS — F1290 Cannabis use, unspecified, uncomplicated: Secondary | ICD-10-CM | POA: Insufficient documentation

## 2014-10-13 DIAGNOSIS — F329 Major depressive disorder, single episode, unspecified: Secondary | ICD-10-CM

## 2014-10-13 DIAGNOSIS — F32A Depression, unspecified: Secondary | ICD-10-CM

## 2014-10-13 DIAGNOSIS — Z515 Encounter for palliative care: Secondary | ICD-10-CM

## 2014-10-13 LAB — TSH: TSH: 15.746 u[IU]/mL — ABNORMAL HIGH (ref 0.350–4.500)

## 2014-10-13 LAB — COMPLETE METABOLIC PANEL WITH GFR
ALBUMIN: 4.4 g/dL (ref 3.6–5.1)
ALK PHOS: 114 U/L (ref 33–115)
ALT: 11 U/L (ref 6–29)
AST: 28 U/L (ref 10–35)
BUN: 19 mg/dL (ref 7–25)
CALCIUM: 9.7 mg/dL (ref 8.6–10.2)
CO2: 28 mmol/L (ref 20–31)
Chloride: 96 mmol/L — ABNORMAL LOW (ref 98–110)
Creat: 1.19 mg/dL — ABNORMAL HIGH (ref 0.50–1.10)
GFR, EST NON AFRICAN AMERICAN: 55 mL/min — AB (ref >=60)
GFR, Est African American: 63 mL/min (ref >=60)
GLUCOSE: 52 mg/dL — AB (ref 65–99)
POTASSIUM: 3.9 mmol/L (ref 3.5–5.3)
SODIUM: 135 mmol/L (ref 135–146)
Total Bilirubin: 1.4 mg/dL — ABNORMAL HIGH (ref 0.2–1.2)
Total Protein: 8.6 g/dL — ABNORMAL HIGH (ref 6.1–8.1)

## 2014-10-13 LAB — CBC
HCT: 37.8 % (ref 36.0–46.0)
HEMOGLOBIN: 13.2 g/dL (ref 12.0–15.0)
MCH: 30.6 pg (ref 26.0–34.0)
MCHC: 34.9 g/dL (ref 30.0–36.0)
MCV: 87.7 fL (ref 78.0–100.0)
MPV: 10.1 fL (ref 8.6–12.4)
Platelets: 174 10*3/uL (ref 150–400)
RBC: 4.31 MIL/uL (ref 3.87–5.11)
RDW: 15.5 % (ref 11.5–15.5)
WBC: 5.7 10*3/uL (ref 4.0–10.5)

## 2014-10-13 NOTE — Progress Notes (Signed)
ASSESSMENT: Pt currently experiencing symptoms of anxiety and depression. Pt needs to f/u with PCP and [facility]; would benefit from psychoeducation and supportive counseling regarding coping with symptoms of anxiety and depression.  Stage of Change: contemplative  PLAN: 1. F/U with behavioral health consultant in as needed 2. Psychiatric Medications: Ativan. 3. Behavioral recommendation(s):   -Continue seeing hospice counselor -Consider reading educational materials regarding coping with symptoms of anxiety and depression SUBJECTIVE: Pt. referred by Dr Armen Pickup for symptoms of anxiety and depression:  Pt. reports the following symptoms/concerns: Pt states that she is seeing a therapist/counselor via hospice for unresolved grief weekly, has had many deaths in the family (including father when she was 55yo, mother murdered when she was 22), she and brother have spent time in prison, she is close to her siblings and ex-husband. She says she is going through a lot of health problems now because of past choices, and says she accepts responsibility.  Duration of problem: over 3 decades Severity: moderate  OBJECTIVE: Orientation & Cognition: Oriented x3. Thought processes normal and appropriate to situation. Mood: appropriate. Affect: appropriate Appearance: appropriate Risk of harm to self or others: no risk of harm to self or others Substance use: tobacco Assessments administered: PHQ9: 21/ GAD7: 12  Diagnosis: Anxiety and depression CPT Code: F -------------------------------------------- Other(s) present in the room: none  Time spent with patient in exam room: 20 minutes

## 2014-10-13 NOTE — Patient Instructions (Addendum)
Ms. Kortas,  Thank you for coming in today. It was a pleasure meeting you. I look forward to being your primary doctor.  Lovell was seen today for follow-up.  Diagnoses and all orders for this visit:  Marijuana use  Hypothyroidism, unspecified hypothyroidism type -     TSH  Hospice care patient  Chronic systolic heart failure -     COMPLETE METABOLIC PANEL WITH GFR -     CBC  Screening for HIV (human immunodeficiency virus) -     HIV antibody (with reflex)  Peripheral edema  compression stockings ordered  F/u in 3 months for pap, Tdap   Dr. Armen Pickup

## 2014-10-13 NOTE — Progress Notes (Signed)
Establish care with new pcp Marijuana user-once per week Tobacco user 1 pack per 3 days Back pain  Pain scale #8

## 2014-10-13 NOTE — Progress Notes (Signed)
Patient ID: [name], female   DOB: [birth year], 44 y.o.   MRN: [id number]   Subjective:  Patient ID: [name], female    DOB: 1970/03/08  Age: 44 y.o. MRN: [id number]  CC: Follow-up   HPI DINAH LUPA presents for   1. CHF: patient CHF and chronic mechanical valve vegetations. Her prognosis is poor. She is on home hospice. She she is compliant with lasix BID, 3 L via [place] prn. She has chronic peripheral edema, CP and SOB. She request handicap placcard for parking.   2. Hypothyroidism: compliant with synthroid.   Outpatient Prescriptions Prior to Visit  Medication Sig Dispense Refill  . amoxicillin (AMOXIL) 500 MG capsule Take 1 capsule (500 mg total) by mouth 3 (three) times daily. 90 capsule 3  . aspirin 81 MG chewable tablet Chew 81 mg by mouth daily.    . furosemide (LASIX) 40 MG tablet TAKE 1 AND 1/2 TABLETS TODAY AND TOMORROW, THEN RESUME 40 MG DAILY 30 tablet 0  . levothyroxine (SYNTHROID, LEVOTHROID) 112 MCG tablet TAKE 1 TABLET (112 MCG TOTAL) BY MOUTH DAILY BEFORE BREAKFAST. 30 tablet 0  . LORazepam (ATIVAN) 0.5 MG tablet Take 1 tablet (0.5 mg total) by mouth every 6 (six) hours as needed for anxiety or sleep. 30 tablet 0  . oxyCODONE-acetaminophen (PERCOCET/ROXICET) 5-325 MG per tablet Take 1-2 tablets by mouth every 4 (four) hours as needed (1 tab for moderate pain; 2 tabs for severe pain). 180 tablet 0  . potassium chloride (K-DUR,KLOR-CON) 10 MEQ tablet Take 1 tablet (10 mEq total) by mouth daily. 30 tablet 3   No facility-administered medications prior to visit.    ROS Review of Systems  Constitutional: Negative for fever and chills.  Eyes: Negative for visual disturbance.  Respiratory: Positive for shortness of breath.   Cardiovascular: Positive for chest pain, palpitations and leg swelling.  Gastrointestinal: Negative for abdominal pain and blood in stool.  Musculoskeletal: Positive for back pain and arthralgias (L shoullder pain chronic ).  Skin: Negative for  rash.  Allergic/Immunologic: Negative for immunocompromised state.  Hematological: Negative for adenopathy. Does not bruise/bleed easily.  Psychiatric/Behavioral: Negative for suicidal ideas and dysphoric mood.  GAD-7: score of 12. 1-6,7. 2-1,4. 3-3,5.   Objective:  BP 102/67 mmHg  Pulse 81  Temp(Src) 97.8 F (36.6 C) (Oral)  Resp 16  Ht  (1.626 m)  Wt 148 lb (67.132 kg)  BMI 25.39 kg/m2  SpO2 94%  BP/Weight 10/13/2014 07/26/2013 07/19/2013  Systolic BP 102 109 -  Diastolic BP 67 73 -  Wt. (Lbs) 148 165.4 -  BMI 25.39 - 28.38    Physical Exam  Constitutional: She is oriented to person, place, and time. She appears well-developed and well-nourished. No distress.  HENT:  Head: Normocephalic and atraumatic.  Cardiovascular: Normal rate, regular rhythm and intact distal pulses.  Exam reveals no gallop and no friction rub.   Murmur heard. Pulmonary/Chest: Effort normal and breath sounds normal.  Musculoskeletal: She exhibits edema.  Neurological: She is alert and oriented to person, place, and time.  Skin: Skin is warm and dry. No rash noted.  Psychiatric: She has a normal mood and affect.     Assessment & Plan:   Problem List Items Addressed This Visit    Chronic systolic heart failure (Chronic)   Relevant Medications   furosemide (LASIX) 40 MG tablet   Other Relevant Orders   COMPLETE METABOLIC PANEL WITH GFR   CBC   Hospice care patient (Chronic)  Hypothyroidism (Chronic)   Relevant Orders   TSH   Marijuana use - Primary   Peripheral edema    Other Visit Diagnoses    Screening for HIV (human immunodeficiency virus)        Relevant Orders    HIV antibody (with reflex)    Healthcare maintenance        Relevant Orders    Flu Vaccine QUAD 36+ mos IM (Completed)       No orders of the defined types were placed in this encounter.    Follow-up: No Follow-up on file.   Dessa Phi MD

## 2014-10-13 NOTE — Assessment & Plan Note (Signed)
Order for compression stockings sent to [facility] [facility]

## 2014-10-13 NOTE — Assessment & Plan Note (Addendum)
A; slightly fluid overloaded with decrease in BP compared to last OV.  P: Labs Continue current lasix dose, may need more potassium Compression stockings

## 2014-10-14 LAB — HIV ANTIBODY (ROUTINE TESTING W REFLEX): HIV: NONREACTIVE

## 2014-10-16 ENCOUNTER — Telehealth: Payer: Self-pay | Admitting: Family Medicine

## 2014-10-16 MED ORDER — LEVOTHYROXINE SODIUM 125 MCG PO TABS: 125.0000 ug | ORAL_TABLET | Freq: Every day | ORAL | Status: DC

## 2014-10-16 NOTE — Addendum Note (Signed)
Addended by: Dessa Phi on: 10/16/2014 10:16 AM   Modules accepted: Orders

## 2014-10-16 NOTE — Telephone Encounter (Signed)
Called patient and patient verified date of birth.  Informed patient of elevated TSH, and to please increase synthroid dose to 125 mcg q AM up from 112 mcg.  Patient aware that medication was sent to [facility].  Patient verbalized understanding.  Also informed patient of Screening HIV negative, CBC normal, and BMP slight low Chloride and glucose, otherwise normal.  Patient has no questions or concerns at this time.

## 2014-10-17 ENCOUNTER — Ambulatory Visit: Payer: Self-pay | Admitting: Internal Medicine

## 2014-11-03 ENCOUNTER — Telehealth: Payer: Self-pay | Admitting: Family Medicine

## 2014-11-03 NOTE — Telephone Encounter (Signed)
Toniann Fail from [facility] called requesting a verbal order for pt. Go out to her next hospice benefit period. Please f/u

## 2014-11-06 NOTE — Telephone Encounter (Signed)
VO given to [place]

## 2015-01-04 ENCOUNTER — Telehealth: Payer: Self-pay | Admitting: Family Medicine

## 2015-01-04 NOTE — Telephone Encounter (Signed)
[name] From Hospice called requesting a verbal order for start of care for he next certification period, new period begins Jan 4. Please follow up.

## 2015-01-05 NOTE — Telephone Encounter (Signed)
Called back to [name] after receiving call from after hours line that she had paged. There was no information provided regarding the nature of the page.  I saw in the chart that [name] called yesterday requesting verbal order to resume care for [name].  Left VM with verbal order to continue care for the next certification period 01/17/2015 Asked that she call back if additional information was needed or she had other concerns.

## 2015-01-26 IMAGING — CT CT HEART MORP W/ CTA COR W/ SCORE W/ CA W/CM &/OR W/O CM
1 of 14 series · 4 of 20 positions shown, 5 images · non-contrast
Comparison: CTA 07/19/2013

CLINICAL DATA: Chest Pain, IV Drug User with bioprosthetic valves
in aortic, tricuspid and mitral positions. Unable to visualize LM at
catheterization. New decrease in EF 35%

EXAM:
Cardiac CTA
MEDICATIONS:
Sub lingual nitro. 4mg and lopressor 5mg Meds limited by hypotension
TECHNIQUE: The patient was scanned on a [employer] [REDACTED]ice scanner. Gantry
rotation speed was 270 msecs. Collimation was .9mm. A 100 kV
prospective scan was triggered in the descending thoracic aorta at
111 HU's with 5% padding centered around 78% of the R-R interval.
Average HR during the scan was 78 bpm. The 3D data set was
interpreted on a dedicated work station using MPR, MIP and VRT
modes. A total of 80cc of contrast was used.

[Series 401: ccta, 78%, idose (3) · axial · 0.38mm/px · z∈[-179,-59]mm · 4 of 598 slices shown, 5 images]
[im 120/598  vessel]
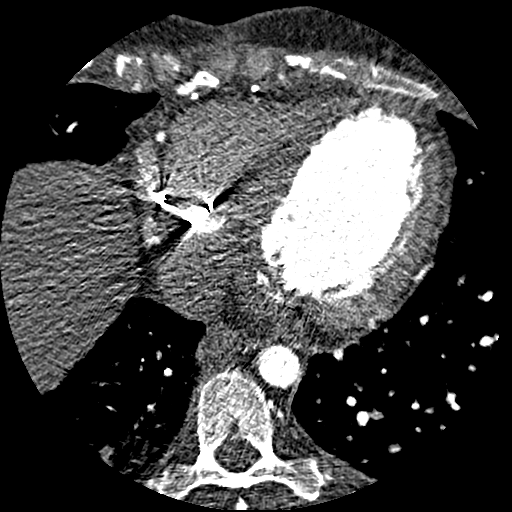
[im 120/598  lung]
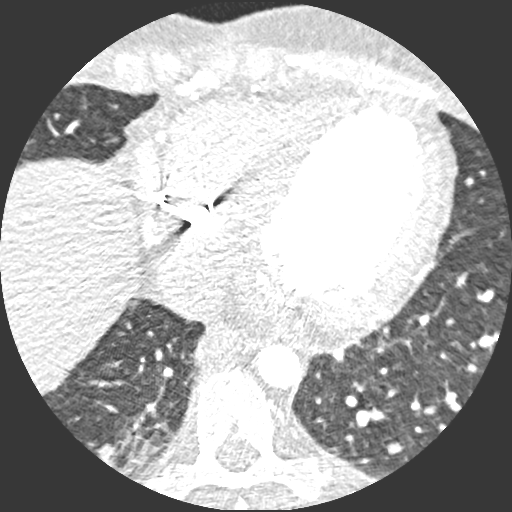
[im 239/598  vessel]
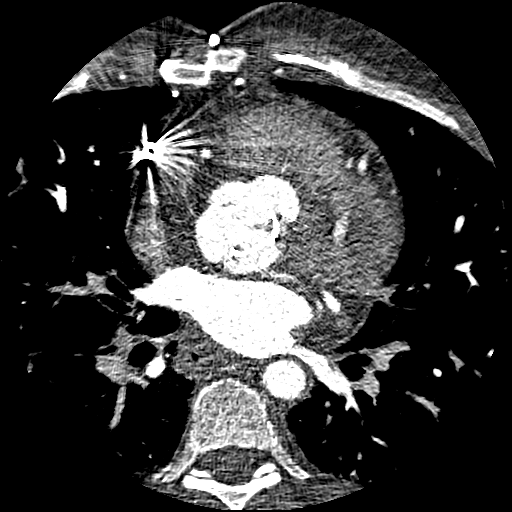
[im 359/598  vessel]
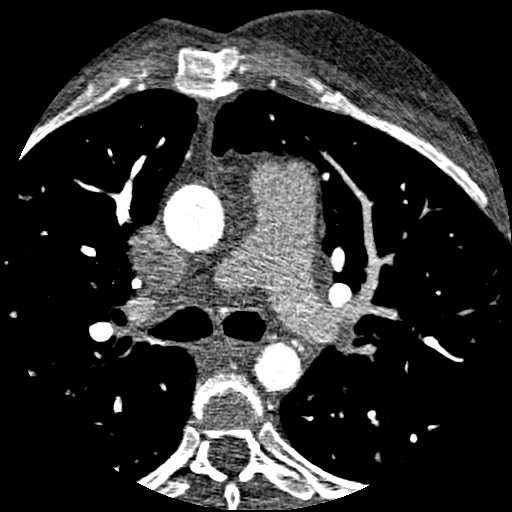
[im 478/598  vessel]
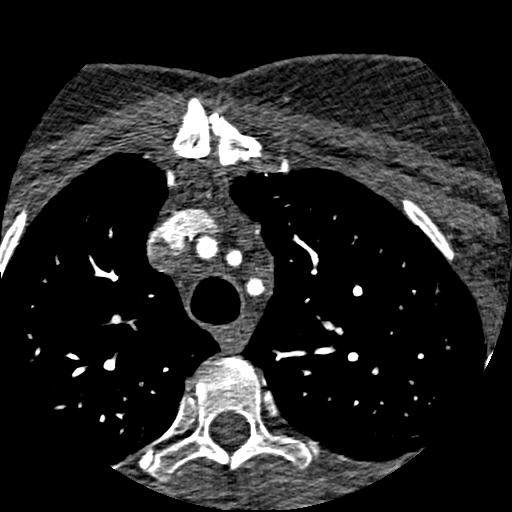

[4 of 20 positions shown; findings below may reference images not displayed]

FINDINGS: Non-cardiac: See separate report from [REDACTED]. No
significant findings on limited lung and soft tissue windows.

Coronary Arteries: Right dominant with no anomalies

LM: Patent superiorly displaced by large periaortic abscess vs
pseudoaneurysm.

LAD:  Widely patent but displace

D1: Normal

D2: Normal

Circumflex: Normal

OM1: Normal

RCA:  Normal

PDA: Normal

PLA:  Normal

The patient has a large aortic "pseudo" aneurysm or ruptured abscess
emanating near the left sinus of valsalva. It extends between the
aorta and the RVOT and may be causing some outflow tract obstruction

In near AP dimension it measures 4.5 cephalad to caudal and 2.5 cm
left to right It is pushing the LM ostia superiorly at some what of
a sharp angle However there is normal antegrade flow to the LM, LAD
and circumflex.

The patient also has bioprosthetic mitral and tricuspid valves which
appear normal There are pacing wires in the RV There is no
pericardial effusion
IMPRESSION: 1) Large aortic pseudo aneurysm vs ruptured abscess emanating from
the left sinus and extending between the aorta and pulmonary artery.
Measures 4.5 x 2.5 cm The ascending aortic root above the

Coronary ostia only measures 2.6 cm and is normal through the arch

2) Normal antegrade flow to the RCA, LM, LAD and circumflex with
malposition of the LM and LAD due to the pseudo/mycotic aneurysm

3) Normal ascending aorta and arch above the coronary insertions

Marielli Turhani

EXAM:
OVER-READ INTERPRETATION  CT CHEST

The following report is an over-read performed by radiologist Dr.
over-read does not include interpretation of cardiac or coronary
anatomy or pathology. The coronary CT has interpretation by the
cardiologist is attached.
FINDINGS: No suspicious pulmonary nodules. There is linear atelectasis at the
right lung base. No axillary supraclavicular lymphadenopathy.
Midline sternotomy noted with some angulation.
IMPRESSION: 1. Right phases are linear atelectasis.
2. Midline sternotomy with angulation.

## 2015-02-13 ENCOUNTER — Telehealth: Payer: Self-pay | Admitting: *Deleted

## 2015-02-13 DIAGNOSIS — I5022 Chronic systolic (congestive) heart failure: Secondary | ICD-10-CM

## 2015-02-13 MED ORDER — FUROSEMIDE 40 MG PO TABS
40.0000 mg | ORAL_TABLET | Freq: Two times a day (BID) | ORAL | Status: DC
Start: 2015-02-13 — End: 2015-04-12

## 2015-02-13 NOTE — Telephone Encounter (Signed)
[facility] pharmacy requesting refill Rx Lasix

## 2015-02-13 NOTE — Telephone Encounter (Signed)
Lasix refilled Patient over due for follow up Due for pap, please schedule her for pap on 03/02/2015

## 2015-02-14 NOTE — Telephone Encounter (Signed)
LVM to return call   Lasix refilled. Please schedule pt for pap on 03/02/2015

## 2015-02-21 ENCOUNTER — Other Ambulatory Visit: Payer: Self-pay

## 2015-02-21 DIAGNOSIS — G4700 Insomnia, unspecified: Secondary | ICD-10-CM

## 2015-02-22 DIAGNOSIS — G4700 Insomnia, unspecified: Secondary | ICD-10-CM | POA: Insufficient documentation

## 2015-02-22 MED ORDER — TEMAZEPAM 15 MG PO CAPS: 15.0000 mg | ORAL_CAPSULE | Freq: Every evening | ORAL | Status: DC | PRN

## 2015-02-22 NOTE — Telephone Encounter (Signed)
temazepam refilled Please inform patient

## 2015-02-23 NOTE — Telephone Encounter (Signed)
LVM Rx at front office ready to be pickup 

## 2015-03-05 ENCOUNTER — Telehealth: Payer: Self-pay | Admitting: Family Medicine

## 2015-03-05 NOTE — Telephone Encounter (Signed)
Toniann Fail from Hospice called to let pt. Nurse know that she needs verbal orders for pt. To continue to receive hospice services that will begin March 5th. Please f/u

## 2015-03-06 NOTE — Telephone Encounter (Signed)
Called back. Gave VM Left VO

## 2015-03-30 ENCOUNTER — Telehealth: Payer: Self-pay | Admitting: *Deleted

## 2015-03-30 DIAGNOSIS — R519 Headache, unspecified: Secondary | ICD-10-CM

## 2015-03-30 DIAGNOSIS — R51 Headache: Principal | ICD-10-CM

## 2015-03-30 MED ORDER — GABAPENTIN 100 MG PO CAPS: 100.0000 mg | ORAL_CAPSULE | Freq: Three times a day (TID) | ORAL | Status: DC

## 2015-03-30 NOTE — Telephone Encounter (Signed)
Gabapentin refilled

## 2015-03-30 NOTE — Telephone Encounter (Signed)
[facility] pharmacy requesting Refill Rx Gabapentin

## 2015-04-12 ENCOUNTER — Other Ambulatory Visit: Payer: Self-pay | Admitting: *Deleted

## 2015-04-12 DIAGNOSIS — I5022 Chronic systolic (congestive) heart failure: Secondary | ICD-10-CM

## 2015-04-12 MED ORDER — FUROSEMIDE 40 MG PO TABS: 40.0000 mg | ORAL_TABLET | Freq: Two times a day (BID) | ORAL | Status: DC

## 2015-04-26 ENCOUNTER — Telehealth: Payer: Self-pay | Admitting: Family Medicine

## 2015-04-26 NOTE — Telephone Encounter (Signed)
Toniann Fail from [facility] called stating that she needs verbal order for the pt. To move into her next Hospice benefit period which starts May 4th. Please f/u

## 2015-05-01 NOTE — Telephone Encounter (Signed)
Called back to [facility]. Left voicemail that included verbal order to start next Hospice benefit period starting on 05/17/2015

## 2015-05-17 ENCOUNTER — Other Ambulatory Visit: Payer: Self-pay | Admitting: *Deleted

## 2015-05-17 MED ORDER — LEVOTHYROXINE SODIUM 125 MCG PO TABS: 125.0000 ug | ORAL_TABLET | Freq: Every day | ORAL | Status: DC

## 2015-06-24 ENCOUNTER — Inpatient Hospital Stay (HOSPITAL_COMMUNITY)
Admission: EM | Admit: 2015-06-24 | Discharge: 2015-06-25 | DRG: 291 | Disposition: A | Discharge status: Hospice | Attending: Internal Medicine | Admitting: Internal Medicine

## 2015-06-24 ENCOUNTER — Emergency Department (HOSPITAL_COMMUNITY)

## 2015-06-24 ENCOUNTER — Encounter (HOSPITAL_COMMUNITY): Payer: Self-pay | Admitting: Emergency Medicine

## 2015-06-24 DIAGNOSIS — Z8673 Personal history of transient ischemic attack (TIA), and cerebral infarction without residual deficits: Secondary | ICD-10-CM | POA: Diagnosis not present

## 2015-06-24 DIAGNOSIS — I5022 Chronic systolic (congestive) heart failure: Secondary | ICD-10-CM

## 2015-06-24 DIAGNOSIS — J9620 Acute and chronic respiratory failure, unspecified whether with hypoxia or hypercapnia: Secondary | ICD-10-CM | POA: Diagnosis present

## 2015-06-24 DIAGNOSIS — E876 Hypokalemia: Secondary | ICD-10-CM | POA: Diagnosis present

## 2015-06-24 DIAGNOSIS — J9621 Acute and chronic respiratory failure with hypoxia: Secondary | ICD-10-CM | POA: Diagnosis present

## 2015-06-24 DIAGNOSIS — N181 Chronic kidney disease, stage 1: Secondary | ICD-10-CM | POA: Diagnosis present

## 2015-06-24 DIAGNOSIS — I509 Heart failure, unspecified: Secondary | ICD-10-CM | POA: Diagnosis not present

## 2015-06-24 DIAGNOSIS — I272 Other secondary pulmonary hypertension: Secondary | ICD-10-CM | POA: Diagnosis present

## 2015-06-24 DIAGNOSIS — E039 Hypothyroidism, unspecified: Secondary | ICD-10-CM | POA: Diagnosis present

## 2015-06-24 DIAGNOSIS — B1920 Unspecified viral hepatitis C without hepatic coma: Secondary | ICD-10-CM | POA: Diagnosis present

## 2015-06-24 DIAGNOSIS — M545 Low back pain, unspecified: Secondary | ICD-10-CM | POA: Insufficient documentation

## 2015-06-24 DIAGNOSIS — F419 Anxiety disorder, unspecified: Secondary | ICD-10-CM | POA: Diagnosis present

## 2015-06-24 DIAGNOSIS — I5023 Acute on chronic systolic (congestive) heart failure: Principal | ICD-10-CM | POA: Diagnosis present

## 2015-06-24 DIAGNOSIS — R0600 Dyspnea, unspecified: Secondary | ICD-10-CM | POA: Insufficient documentation

## 2015-06-24 DIAGNOSIS — G8929 Other chronic pain: Secondary | ICD-10-CM | POA: Diagnosis present

## 2015-06-24 DIAGNOSIS — Z950 Presence of cardiac pacemaker: Secondary | ICD-10-CM

## 2015-06-24 DIAGNOSIS — Z7982 Long term (current) use of aspirin: Secondary | ICD-10-CM

## 2015-06-24 DIAGNOSIS — Z79891 Long term (current) use of opiate analgesic: Secondary | ICD-10-CM

## 2015-06-24 DIAGNOSIS — F17210 Nicotine dependence, cigarettes, uncomplicated: Secondary | ICD-10-CM | POA: Diagnosis present

## 2015-06-24 DIAGNOSIS — Z9981 Dependence on supplemental oxygen: Secondary | ICD-10-CM | POA: Diagnosis not present

## 2015-06-24 DIAGNOSIS — Z7189 Other specified counseling: Secondary | ICD-10-CM | POA: Insufficient documentation

## 2015-06-24 DIAGNOSIS — Z66 Do not resuscitate: Secondary | ICD-10-CM | POA: Diagnosis present

## 2015-06-24 DIAGNOSIS — Z515 Encounter for palliative care: Secondary | ICD-10-CM | POA: Insufficient documentation

## 2015-06-24 DIAGNOSIS — Z79899 Other long term (current) drug therapy: Secondary | ICD-10-CM

## 2015-06-24 DIAGNOSIS — T8201XA Breakdown (mechanical) of heart valve prosthesis, initial encounter: Secondary | ICD-10-CM | POA: Diagnosis present

## 2015-06-24 DIAGNOSIS — Z953 Presence of xenogenic heart valve: Secondary | ICD-10-CM

## 2015-06-24 DIAGNOSIS — Z952 Presence of prosthetic heart valve: Secondary | ICD-10-CM | POA: Diagnosis not present

## 2015-06-24 DIAGNOSIS — R0602 Shortness of breath: Secondary | ICD-10-CM | POA: Diagnosis present

## 2015-06-24 DIAGNOSIS — D649 Anemia, unspecified: Secondary | ICD-10-CM | POA: Diagnosis not present

## 2015-06-24 HISTORY — DX: Atherosclerotic heart disease of native coronary artery without angina pectoris: I25.10

## 2015-06-24 LAB — COMPREHENSIVE METABOLIC PANEL
ALBUMIN: 3.5 g/dL (ref 3.5–5.0)
ALK PHOS: 75 U/L (ref 38–126)
ALT: 19 U/L (ref 14–54)
ANION GAP: 9 (ref 5–15)
AST: 32 U/L (ref 15–41)
BUN: 14 mg/dL (ref 6–20)
CALCIUM: 9 mg/dL (ref 8.9–10.3)
CO2: 23 mmol/L (ref 22–32)
Chloride: 102 mmol/L (ref 101–111)
Creatinine, Ser: 1.13 mg/dL — ABNORMAL HIGH (ref 0.44–1.00)
GFR calc Af Amer: >60 mL/min (ref >60)
GFR calc non Af Amer: 57 mL/min — ABNORMAL LOW (ref >60)
GLUCOSE: 119 mg/dL — AB (ref 65–99)
Potassium: 3.1 mmol/L — ABNORMAL LOW (ref 3.5–5.1)
SODIUM: 134 mmol/L — AB (ref 135–145)
Total Bilirubin: 1.1 mg/dL (ref 0.3–1.2)
Total Protein: 7.5 g/dL (ref 6.5–8.1)

## 2015-06-24 LAB — I-STAT VENOUS BLOOD GAS, ED
Acid-base deficit: 2 mmol/L (ref 0.0–2.0)
BICARBONATE: 24 meq/L (ref 20.0–24.0)
O2 SAT: 97 %
PCO2 VEN: 45.6 mmHg (ref 45.0–50.0)
PO2 VEN: 96 mmHg — AB (ref 31.0–45.0)
Patient temperature: 37
TCO2: 25 mmol/L (ref 0–100)
pH, Ven: 7.329 — ABNORMAL HIGH (ref 7.250–7.300)

## 2015-06-24 LAB — CBC WITH DIFFERENTIAL/PLATELET
Basophils Absolute: 0 10*3/uL (ref 0.0–0.1)
Basophils Relative: 0 %
EOS ABS: 0.1 10*3/uL (ref 0.0–0.7)
Eosinophils Relative: 1 %
HEMATOCRIT: 35.1 % — AB (ref 36.0–46.0)
HEMOGLOBIN: 11 g/dL — AB (ref 12.0–15.0)
LYMPHS ABS: 0.5 10*3/uL — AB (ref 0.7–4.0)
LYMPHS PCT: 11 %
MCH: 26.2 pg (ref 26.0–34.0)
MCHC: 31.3 g/dL (ref 30.0–36.0)
MCV: 83.6 fL (ref 78.0–100.0)
MONOS PCT: 7 %
Monocytes Absolute: 0.3 10*3/uL (ref 0.1–1.0)
NEUTROS PCT: 81 %
Neutro Abs: 3.7 10*3/uL (ref 1.7–7.7)
Platelets: 131 10*3/uL — ABNORMAL LOW (ref 150–400)
RBC: 4.2 MIL/uL (ref 3.87–5.11)
RDW: 19.2 % — ABNORMAL HIGH (ref 11.5–15.5)
WBC: 4.6 10*3/uL (ref 4.0–10.5)

## 2015-06-24 LAB — I-STAT TROPONIN, ED: TROPONIN I, POC: 0.03 ng/mL (ref 0.00–0.08)

## 2015-06-24 LAB — MRSA PCR SCREENING: MRSA by PCR: NEGATIVE

## 2015-06-24 LAB — TSH: TSH: >90 u[IU]/mL — ABNORMAL HIGH (ref 0.350–4.500)

## 2015-06-24 LAB — I-STAT CG4 LACTIC ACID, ED: Lactic Acid, Venous: 1.8 mmol/L (ref 0.5–2.0)

## 2015-06-24 LAB — BRAIN NATRIURETIC PEPTIDE: B Natriuretic Peptide: 388.2 pg/mL — ABNORMAL HIGH (ref 0.0–100.0)

## 2015-06-24 MED ORDER — TEMAZEPAM 15 MG PO CAPS: 15.0000 mg | ORAL_CAPSULE | Freq: Every evening | ORAL | Status: DC | PRN

## 2015-06-24 MED ORDER — IPRATROPIUM-ALBUTEROL 0.5-2.5 (3) MG/3ML IN SOLN
3.0000 mL | Freq: Four times a day (QID) | RESPIRATORY_TRACT | Status: DC
  Administered 2015-06-24 (×2): 3 mL via RESPIRATORY_TRACT
  Filled 2015-06-24: qty 3

## 2015-06-24 MED ORDER — ENOXAPARIN SODIUM 40 MG/0.4ML ~~LOC~~ SOLN
40.0000 mg | SUBCUTANEOUS | Status: DC
  Administered 2015-06-24: 40 mg via SUBCUTANEOUS
  Filled 2015-06-24: qty 0.4

## 2015-06-24 MED ORDER — FUROSEMIDE 10 MG/ML IJ SOLN
40.0000 mg | Freq: Two times a day (BID) | INTRAMUSCULAR | Status: DC
  Administered 2015-06-24 – 2015-06-25 (×2): 40 mg via INTRAVENOUS
  Filled 2015-06-24 (×2): qty 4

## 2015-06-24 MED ORDER — SODIUM CHLORIDE 0.9% FLUSH: 3.0000 mL | INTRAVENOUS | Status: DC | PRN

## 2015-06-24 MED ORDER — SERTRALINE HCL 50 MG PO TABS
50.0000 mg | ORAL_TABLET | Freq: Every day | ORAL | Status: DC
  Administered 2015-06-24: 50 mg via ORAL
  Filled 2015-06-24: qty 1

## 2015-06-24 MED ORDER — ONDANSETRON HCL 4 MG/2ML IJ SOLN: 4.0000 mg | Freq: Four times a day (QID) | INTRAMUSCULAR | Status: DC | PRN

## 2015-06-24 MED ORDER — GABAPENTIN 100 MG PO CAPS
100.0000 mg | ORAL_CAPSULE | Freq: Three times a day (TID) | ORAL | Status: DC
  Administered 2015-06-24 (×2): 100 mg via ORAL
  Filled 2015-06-24 (×2): qty 1

## 2015-06-24 MED ORDER — IPRATROPIUM-ALBUTEROL 0.5-2.5 (3) MG/3ML IN SOLN
RESPIRATORY_TRACT | Status: AC
Start: 2015-06-24 — End: 2015-06-24
  Administered 2015-06-24: 3 mL via RESPIRATORY_TRACT
  Filled 2015-06-24: qty 3

## 2015-06-24 MED ORDER — LORAZEPAM 0.5 MG PO TABS
0.5000 mg | ORAL_TABLET | Freq: Four times a day (QID) | ORAL | Status: DC | PRN
  Administered 2015-06-24: 0.5 mg via ORAL
  Filled 2015-06-24: qty 1

## 2015-06-24 MED ORDER — POTASSIUM CHLORIDE CRYS ER 20 MEQ PO TBCR
60.0000 meq | EXTENDED_RELEASE_TABLET | ORAL | Status: AC
  Administered 2015-06-24: 60 meq via ORAL
  Filled 2015-06-24: qty 3

## 2015-06-24 MED ORDER — OXYCODONE HCL 5 MG PO TABS
10.0000 mg | ORAL_TABLET | ORAL | Status: DC | PRN
  Administered 2015-06-24: 20 mg via ORAL
  Administered 2015-06-24 – 2015-06-25 (×2): 10 mg via ORAL
  Filled 2015-06-24: qty 2
  Filled 2015-06-24: qty 4
  Filled 2015-06-24: qty 2

## 2015-06-24 MED ORDER — METHADONE HCL 10 MG PO TABS
20.0000 mg | ORAL_TABLET | Freq: Three times a day (TID) | ORAL | Status: DC
  Administered 2015-06-24 (×2): 20 mg via ORAL
  Filled 2015-06-24 (×2): qty 2

## 2015-06-24 MED ORDER — AMOXICILLIN 500 MG PO CAPS
500.0000 mg | ORAL_CAPSULE | Freq: Three times a day (TID) | ORAL | Status: DC
  Administered 2015-06-24 (×2): 500 mg via ORAL
  Filled 2015-06-24 (×4): qty 1

## 2015-06-24 MED ORDER — POTASSIUM CHLORIDE CRYS ER 10 MEQ PO TBCR
10.0000 meq | EXTENDED_RELEASE_TABLET | Freq: Every day | ORAL | Status: DC
  Administered 2015-06-24: 10 meq via ORAL
  Filled 2015-06-24: qty 1

## 2015-06-24 MED ORDER — FUROSEMIDE 10 MG/ML IJ SOLN
40.0000 mg | INTRAMUSCULAR | Status: AC
  Administered 2015-06-24: 40 mg via INTRAVENOUS
  Filled 2015-06-24: qty 4

## 2015-06-24 MED ORDER — SENNA 8.6 MG PO TABS
2.0000 | ORAL_TABLET | Freq: Every day | ORAL | Status: DC
  Administered 2015-06-24: 17.2 mg via ORAL
  Filled 2015-06-24: qty 2

## 2015-06-24 MED ORDER — SODIUM CHLORIDE 0.9 % IV SOLN: 250.0000 mL | INTRAVENOUS | Status: DC | PRN

## 2015-06-24 MED ORDER — MORPHINE SULFATE 10 MG/5ML PO SOLN: 5.0000 mg | ORAL | Status: DC | PRN

## 2015-06-24 MED ORDER — ACETAMINOPHEN 325 MG PO TABS: 650.0000 mg | ORAL_TABLET | ORAL | Status: DC | PRN

## 2015-06-24 MED ORDER — LEVOTHYROXINE SODIUM 100 MCG PO TABS
200.0000 ug | ORAL_TABLET | Freq: Every day | ORAL | Status: DC
  Administered 2015-06-24 – 2015-06-25 (×2): 200 ug via ORAL
  Filled 2015-06-24 (×2): qty 2

## 2015-06-24 MED ORDER — PROCHLORPERAZINE MALEATE 10 MG PO TABS
10.0000 mg | ORAL_TABLET | Freq: Four times a day (QID) | ORAL | Status: DC | PRN
  Filled 2015-06-24: qty 1

## 2015-06-24 MED ORDER — SODIUM CHLORIDE 0.9% FLUSH
3.0000 mL | Freq: Two times a day (BID) | INTRAVENOUS | Status: DC
  Administered 2015-06-24: 3 mL via INTRAVENOUS

## 2015-06-24 MED ORDER — MORPHINE SULFATE (PF) 2 MG/ML IV SOLN: 1.0000 mg | INTRAVENOUS | Status: DC | PRN

## 2015-06-24 MED ORDER — ASPIRIN 81 MG PO CHEW
81.0000 mg | CHEWABLE_TABLET | Freq: Every day | ORAL | Status: DC
  Administered 2015-06-24: 81 mg via ORAL
  Filled 2015-06-24: qty 1

## 2015-06-24 NOTE — Progress Notes (Signed)
   06/24/15 0305  BiPAP/CPAP/SIPAP  BiPAP/CPAP/SIPAP Pt Type Adult  Mask Type Full face mask  Mask Size Medium  Set Rate 12 breaths/min  Respiratory Rate 32 breaths/min  IPAP 12 cmH20  EPAP 6 cmH2O  Oxygen Percent 50 %  Minute Ventilation 13.3  Leak 3  Peak Inspiratory Pressure (PIP) 14  Tidal Volume (Vt) 411  BiPAP/CPAP/SIPAP BiPAP  Patient Home Equipment No  Auto Titrate No  Press High Alarm 25 cmH2O  Press Low Alarm 5 cmH2O  BiPAP/CPAP /SiPAP Vitals  Pulse Rate 83  Resp (!) 32  SpO2 98 %  Bilateral Breath Sounds Fine crackles

## 2015-06-24 NOTE — H&P (Signed)
History and Physical    [name] PFX:[id number] DOB: Mar 08, 1970 DOA: 06/24/2015  PCP: Minerva Ends, MD Patient coming from: home  Chief Complaint: sob  HPI: [name] is an unfortunate 45 y.o. female with medical history significant for chronic systolic heart failure, bacterial endocarditis, hepatitis C, polysubstance abuse presents to the emergency department via EMS with the chief complaint of worsening shortness of breath. Initial evaluation reveals acute on chronic respiratory failure likely due to acute on chronic systolic heart failure. He is a hospice patient requests being more comparable  Information is obtained from the patient and the chart. She was in her usual state of health until early this morning she developed sudden onset persistent shortness of breath. He denies chest pain palpitation headache dizziness syncope or near-syncope. She denies worsening lower extremity edema but does endorse orthopnea. She denies fever chills abdominal pain nausea vomiting dysuria hematuria frequency or urgency. He reports increasing her home oxygen with little relief. EMS was called and reportedly found patient to have oxygen saturation level 90. She was placed patient onCPAP which improved oxygen saturation 97%. Oxygen saturation level noted to be 88% on room air.   ED Course: She is provided with 40 mg Lasix IV in the emergency department placed on BiPAP  Review of Systems: As per HPI otherwise 10 point review of systems negative.   Ambulatory Status: independant  Past Medical History  Diagnosis Date  . Bacterial endocarditis     a. s/p MVR and TVR at [facility] 06/2010 secondary to endocarditis related to IVDA; normal cors by cath 06/2010 at [facility]. b. re-do mitral valve replacement, tricuspid valve replacement, and aortic valve replacement (all bioprosthetic).  [name] DJD (degenerative joint disease)   . Hepatitis C   . Anemia   . Hypothyroidism   . Stroke [facility]) 05/2010    septic emboli  to brain  . Anxiety   . Mental disorder   . Chronic pain   . Noncompliance   . Polysubstance abuse   . Sepsis ([place])     a. 05/2010 with sepsis, vegegations on valve replacements, ARDS, VDRF, multiorgan failure with elevated cardiac enzymes, suffered a left fronto-parietal subarachnoid hemorrhage, pulmonary infarcts, ARF requiring brief hemodialysis. s/p perciardial MVR/TVR.  [name] Atrial flutter ([place])     a. Post-op from valve replacement 06/2010.  [name] CHB (complete heart block) ([place])     a. After valve replacement 09/2012 - pacer placement.  . Opiate abuse, episodic   . S/P AVR, redo MVR and redo TVR with bioprosthetic valves 09/26/2012    All bioprosthetic tissue valves placed for Strep viridans prosthetic valve endocarditis by Dr. Vanetta Mulders @ [facility]  . S/P placement of cardiac pacemaker 10/08/2012    Transvenous permanent pacemaker for CHB following redo TVR, redo MVR, AVR @ [place]  . 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 Shortness of breath     Past Surgical History  Procedure Laterality Date  . Cholecystectomy    . Tonsillectomy and adenoidectomy    . Valve replacement  09/26/2012    AVR + redo MVR + redo TVR for PVE @ [place]  . Valve replacement  06/2010    MVR + TVR for bacterial endocarditis @ [place]  . Pacemaker placement  10/08/2012    [facility] for [place]  after redo TVR  . Cataract extraction    . Multiple extractions with alveoloplasty  09/01/2011    Procedure: MULTIPLE EXTRACION WITH ALVEOLOPLASTY;  Surgeon: Gae Bon, DDS;  Location: [place];  Service: Oral Surgery;  Laterality: N/A;  . Tee without cardioversion N/A 02/09/2013    Procedure: TRANSESOPHAGEAL ECHOCARDIOGRAM (TEE);  Surgeon: Larey Dresser, MD;  Location: [place];  Service: Cardiovascular;  Laterality: N/A;  pt awaker,  responsive,..tol procedure well...MAP 76  . Tee without cardioversion N/A 04/27/2013    Procedure: TRANSESOPHAGEAL ECHOCARDIOGRAM (TEE);  Surgeon: Pixie Casino, MD;  Location: [facility] ENDOSCOPY;  Service: Cardiovascular;  Laterality: N/A;  . Left and right heart catheterization with coronary angiogram N/A 07/21/2013    Procedure: LEFT AND RIGHT HEART CATHETERIZATION WITH CORONARY ANGIOGRAM;  Surgeon: Burnell Blanks, MD;  Location: [facility] CATH LAB;  Service: Cardiovascular;  Laterality: N/A;    Social History   Social History  . Marital Status: Divorced    Spouse Name: N/A  . Number of Children: N/A  . Years of Education: N/A   Occupational History  . Not on file.   Social History Main Topics  . Smoking status: Current Some Day Smoker -- 0.00 packs/day for 30 years    Types: Cigarettes    Last Attempt to Quit: 02/13/2013  . Smokeless tobacco: Never Used     Comment: 1 pack every 3 days - smoking vapor cigs   . Alcohol Use: No  . Drug Use: 7.00 per week    Special: Marijuana, Cocaine     Comment: prior IVDA, cocaine. + occasional narcotics  . Sexual Activity: No   Other Topics Concern  . Not on file   Social History Narrative    No Known Allergies  Family History  Problem Relation Age of Onset  . Cancer    . Coronary artery disease    . Cancer Maternal Uncle   . Cancer Paternal Aunt   . Heart disease Maternal Grandfather   . Cancer Paternal Grandmother   . Cancer Paternal Grandfather     Prior to Admission medications   Medication Sig Start Date End Date Taking? Authorizing Provider  amoxicillin (AMOXIL) 500 MG capsule Take 1 capsule (500 mg total) by mouth 3 (three) times daily. 09/02/13  Yes Lorayne Marek, MD  aspirin 81 MG chewable tablet Chew 81 mg by mouth daily.   Yes Historical Provider, MD  furosemide (LASIX) 40 MG tablet Take 1 tablet (40 mg total) by mouth 2 (two) times daily. Pt need OV prior future refills 04/12/15  Yes Josalyn Funches, MD  gabapentin  (NEURONTIN) 100 MG capsule Take 1 capsule (100 mg total) by mouth 3 (three) times daily. 03/30/15  Yes Josalyn Funches, MD  levothyroxine (SYNTHROID, LEVOTHROID) 125 MCG tablet Take 1 tablet (125 mcg total) by mouth daily. Pt need OV for future refills Patient taking differently: Take 200 mcg by mouth daily. Pt need OV for future refills 05/17/15  Yes Josalyn Funches, MD  LORazepam (ATIVAN) 0.5 MG tablet Take 1 tablet (0.5 mg total) by mouth every 6 (six) hours as needed for anxiety or sleep. 07/26/13  Yes Geradine Girt, DO  methadone (DOLOPHINE) 5 MG tablet Take 20 mg by mouth 3 (three) times daily.    Yes Historical Provider, MD  potassium chloride (K-DUR,KLOR-CON) 10 MEQ tablet Take 1 tablet (10 mEq total) by mouth daily. 09/20/13  Yes Lorayne Marek, MD  prochlorperazine (COMPAZINE) 10 MG tablet Take 10 mg by mouth every 6 (six)  hours as needed for nausea or vomiting.   Yes Historical Provider, MD  senna (SENOKOT) 8.6 MG tablet Take 2-4 tablets by mouth daily.    Yes Historical Provider, MD  sertraline (ZOLOFT) 50 MG tablet Take 50 mg by mouth daily.   Yes Historical Provider, MD  temazepam (RESTORIL) 15 MG capsule Take 1 capsule (15 mg total) by mouth at bedtime as needed for sleep. 02/22/15  Yes Boykin Nearing, MD    Physical Exam: Filed Vitals:   06/24/15 0615 06/24/15 0630 06/24/15 0729 06/24/15 0730  BP: 114/81 102/69    Pulse: 84 79  77  Temp:      TempSrc:      Resp: '24 30  22  '$ SpO2: 100% 100% 100% 100%     General:  Appears calm and comfortable on BiPAP, thin and frail appears chronically ill Eyes:  PERRL, EOMI, normal lids, iris ENT:  grossly normal hearing, lips & tongue, mmm Neck:  no LAD, masses or thyromegaly Cardiovascular:  RRR, +murmur. 1+ lower extremity edema. Chronic venous stasis changes  Respiratory:  Mild increased work of breathing on BiPAP breath sounds somewhat diminished fine crackles up to mid lobes bilaterally no wheeze Abdomen:  soft, ntnd, NABS Skin:  no rash  or induration seen on limited exam Musculoskeletal:  grossly normal tone BUE/BLE, good ROM, no bony abnormality Psychiatric:  grossly normal mood and affect, speech fluent and appropriate, AOx3 Neurologic:  CN 2-12 grossly intact, moves all extremities in coordinated fashion, sensation intact  Labs on Admission: I have personally reviewed following labs and imaging studies  CBC:  Recent Labs Lab 06/24/15 0307  WBC 4.6  NEUTROABS 3.7  HGB 11.0*  HCT 35.1*  MCV 83.6  PLT 149*   Basic Metabolic Panel:  Recent Labs Lab 06/24/15 0307  NA 134*  K 3.1*  CL 102  CO2 23  GLUCOSE 119*  BUN 14  CREATININE 1.13*  CALCIUM 9.0   GFR: CrCl cannot be calculated (Unknown ideal weight.). Liver Function Tests:  Recent Labs Lab 06/24/15 0307  AST 32  ALT 19  ALKPHOS 75  BILITOT 1.1  PROT 7.5  ALBUMIN 3.5   No results for input(s): LIPASE, AMYLASE in the last 168 hours. No results for input(s): AMMONIA in the last 168 hours. Coagulation Profile: No results for input(s): INR, PROTIME in the last 168 hours. Cardiac Enzymes: No results for input(s): CKTOTAL, CKMB, CKMBINDEX, TROPONINI in the last 168 hours. BNP (last 3 results) No results for input(s): PROBNP in the last 8760 hours. HbA1C: No results for input(s): HGBA1C in the last 72 hours. CBG: No results for input(s): GLUCAP in the last 168 hours. Lipid Profile: No results for input(s): CHOL, HDL, LDLCALC, TRIG, CHOLHDL, LDLDIRECT in the last 72 hours. Thyroid Function Tests: No results for input(s): TSH, T4TOTAL, FREET4, T3FREE, THYROIDAB in the last 72 hours. Anemia Panel: No results for input(s): VITAMINB12, FOLATE, FERRITIN, TIBC, IRON, RETICCTPCT in the last 72 hours. Urine analysis:    Component Value Date/Time   COLORURINE YELLOW 07/19/2013 0146   COLORURINE Amber 02/23/2012 1624   APPEARANCEUR CLOUDY* 07/19/2013 0146   APPEARANCEUR Clear 02/23/2012 1624   LABSPEC 1.015 07/19/2013 0146   LABSPEC 1.010  02/23/2012 1624   PHURINE 6.5 07/19/2013 0146   PHURINE 6.0 02/23/2012 1624   GLUCOSEU NEGATIVE 07/19/2013 0146   GLUCOSEU Negative 02/23/2012 1624   HGBUR NEGATIVE 07/19/2013 0146   HGBUR Negative 02/23/2012 1624   BILIRUBINUR NEGATIVE 07/19/2013 0146   BILIRUBINUR Negative 02/23/2012 1624  KETONESUR NEGATIVE 07/19/2013 0146   KETONESUR Negative 02/23/2012 1624   PROTEINUR NEGATIVE 07/19/2013 0146   PROTEINUR Negative 02/23/2012 1624   UROBILINOGEN 0.2 07/19/2013 0146   NITRITE NEGATIVE 07/19/2013 0146   NITRITE Negative 02/23/2012 1624   LEUKOCYTESUR MODERATE* 07/19/2013 0146   LEUKOCYTESUR Negative 02/23/2012 1624    Creatinine Clearance: CrCl cannot be calculated (Unknown ideal weight.).  Sepsis Labs: '@LABRCNTIP'$ (procalcitonin:4,lacticidven:4) )No results found for this or any previous visit (from the past 240 hour(s)).   Radiological Exams on Admission: Dg Chest Port 1 View  06/24/2015  CLINICAL DATA:  Increasing shortness of breath. History of back. Endocarditis, opiate abuse. EXAM: PORTABLE CHEST 1 VIEW COMPARISON:  CT chest July 22, 2013. FINDINGS: Cardiac silhouette is mildly enlarged, status post median sternotomy for aortic valve replacement. Mediastinal silhouette is nonsuspicious. Pulmonary vascular congestion and mild interstitial prominence. At least moderate RIGHT and small LEFT pleural effusions with patchy alveolar airspace opacities in the mid and lower lung zones. No pneumothorax. Soft tissue planes and included osseous structures are nonsuspicious. IMPRESSION: Mild cardiomegaly. Findings of pulmonary edema with confluent edema in the mid and lower lung zones, less likely pneumonia. At least moderate RIGHT and small LEFT pleural effusions. Electronically Signed   By: Elon Alas M.D.   On: 06/24/2015 03:41    EKG: none  Assessment/Plan Principal Problem:   Acute on chronic respiratory failure [facility]) Active Problems:   Hypothyroidism   Anemia    Pulmonary HTN/62 mmHg   Prosthetic heart valve failure- MVR/TVR 6/12, AOV 9/14   S/P AVR, redo MVR and redo TVR with bioprosthetic valves   Hepatitis C   Hospice care patient   CHF exacerbation ([place])   Acute on chronic systolic heart failure (HCC)   Hypokalemia   CKD (chronic kidney disease), stage I    #1. Acute on chronic respiratory failure likely related to acute on chronic systolic heart failure. Oxygen saturation level 80% on room air. Chest x-ray findings of pulmonary edema with confluent edema in mid and lower lungs and less likely pneumonia. Moderate right and small left pleural effusions. Oxygen saturation level greater than 90% on BiPAP. Venous blood gas with a pH of 7.32 PCO2 45 PO2 96. Lactic acid within the limits of normal. Initial troponin negative -Admit to step down -Continue BiPAP -Wean as able -Lasix 40 mg IV twice a day -Monitor intake and output -Obtain daily weights -Palliative care consult  #2. Acute on chronic systolic heart failure. BNP 388 Most recent echo 2015 shows an EF of 30%. Home Lasix dose 40 mg twice a day, not on beta blocker or ACE inhibitor -Lasix IV 40 mg twice a day -Daily weight -Intake and output  #3. Hypokalemia. Likely related to Lasix use. Mild. -We'll replete -We'll monitor closely given need for increased IV Lasix  4. Chronic kidney disease stage I. Creatinine 1.13. This appears to be close to her baseline. Likely related to Lasix use -Monitor urine output -Monitor be met daily  #5. Hypothyroidism. Last noted TSH September 2016 15.7 -Check TSH -Continue Synthroid  #6. Prosthetic heart valve/status post aVR redo and MVR redo -Into new amoxicillin     DVT prophylaxis: lovenox Code Status: dnr  Family Communication: grandaughter at bedside  Disposition Plan: home with hospice  Consults called: none  Admission status: inpatient    Radene Gunning MD [facility]  If 7PM-7AM, please contact  night-coverage [email or web address] Password [facility]  06/24/2015, 8:29 AM

## 2015-06-24 NOTE — Progress Notes (Addendum)
[facility]- [id number]   [facility] RN Liason Note   This is a GIP, related and covered admission of 06/24/15 per Dr. Barbee Shropshire with a [facility] dx of Heart Disease.   Code status is DNR.   Patient transferred to [facility] ED during the night via EMS due to sudden onset of increased dyspnea.  Patient did not notify Hospice.   She is admitted due to acute on chronic respiratory failure.   Chest x-ray in the ED revealed pulmonary edema with bilateral pleural effusion.  Patient placed on BIPAP during transfer which was continued due to low 02 sat in the ED.  Patient had a foley catheter placed and was given 40 mg IV lasix.    Visited patient at bedside.  Daughter present with her two small children.  Patient is resting but did arouse. Color dusky.  She is now on 02 per n/c at 2L.  Per RN on unit she has just been taken off Bipap.   She did not respond verbally but does nod her head in response to questions.  Respirations are unlabored at this time and when asked if she was breathing better she nodded her head "yes".    Patient is on liquid diet and did eat some jello this morning per daughter.   Spoke with staff RN  - Patient is to continue with IV lasix 40mg  BID and is scheduled for repeat lab work in the AM.   Patient did request a prn oxycodone.  Staff RN aware.    Transfer summary and updated medication list placed on shadow chart.   [facility] will continue to follow patient and anticipate any discharge needs.   Please call with any hospice related questions or concerns.   Lavone Neri, RN  [facility] Liason  [phone]

## 2015-06-24 NOTE — Progress Notes (Signed)
Pt stated that she didn't want to be on the air mattress ( order received for pt having chronic back pain) because she doesn't want to be on a bed that will make her slide around. Bed declined at this point. I have informed her and her ex husband at bedside that if she changes her mind, please inform staff

## 2015-06-24 NOTE — Consult Note (Signed)
Consultation Note Date: 06/24/2015   Patient Name: [name]  DOB: Jan 27, 1970  MRN: [id number]  Age / Sex: 45 y.o., female  PCP: Dessa Phi, MD Referring Physician: Joseph Art, DO  Reason for Consultation: Establishing goals of care and Psychosocial/spiritual support, symptom managment  HPI/Patient Profile: 45 y.o. female  admitted on 06/24/2015 with PMH chronic systolic heart failure, bacterial endocarditis, hepatitis C, polysubstance abuse presents to the emergency department via EMS with the chief complaint of worsening shortness of breath. Initial evaluation reveals acute on chronic respiratory failure likely due to acute on chronic systolic heart failure.   She is currently  a hospice patient with [facility]   Clinical Assessment and Goals of Care:   This NP Lorinda Creed reviewed medical records, received report from team, assessed the patient and then meet at the patient's bedside initially with her daughter and then again with  her ex-husband Onalee Hua to discuss diagnosis prognosis, GOC, EOL wishes disposition and options.  Onalee Hua is loud and appears anxious as he verbalizes that his mother "died today at [facility], and it was because of this hospital", he continues to rant regarding his "situation that he has a broken femur and nobody will do anything about it because they think he is a drug addict".  I was able to bring him back to the situation at hand concerning the patient [name]  A  discussion was had today regarding advanced directives.  Concept specific to code status and utilization of BiPap and rehospitalization was had.  The difference between a aggressive medical intervention path  and a palliative comfort care path for this patient at this time was had.  Values and goals of care important to patient  were attempted to be elicited.   Natural trajectory and expectations at  EOL were discussed.  Questions and concerns addressed. Patient encouraged to call with questions or concerns.  PMT will continue to support holistically.    SUMMARY OF RECOMMENDATIONS  as discussed with patient  -DNR/DNI- no BiPap/ utilize medications for symptom management   - comfort is the main focus of care, Joselyne understands that she has continued to decline over the past year and her time is "getting closer"  -discharge home with hospice when stable.  She is open utilization of medications to treat chronic disease at this time, and hopes "for more quality time"   Code Status/Advance Care Planning:  DNR   No BiPap -utilize medications for symptom management     Symptom Management:    Dyspnea: Roxanol 5 mg po/sl every 2 hrs prn  Chronic Back pain: continue current medication regime                  Add gel overlay and utilization of K-pad  Palliative Prophylaxis:   Bowel Regimen, Frequent Pain Assessment and Oral Care  Additional Recommendations (Limitations, Scope, Preferences):  Avoid Hospitalization  Psycho-social/Spiritual:     Additional Recommendations:  Involve hospice with this hospice stay and discharge plan   I spoke  with Reed Breech RN/hospice liason and she will discuss the case with Jordin's regular home hospice nurse in the morning  Prognosis:   Less than 6 months, possibly less depending outcomes of this hospital stay  Discharge Planning: Home with Hospice      Primary Diagnoses: Present on Admission:  . Hypothyroidism . Pulmonary HTN/62 mmHg . 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 Hepatitis C . Anemia . Acute on chronic respiratory failure (HCC) . Acute on chronic systolic heart failure (HCC) . Hypokalemia . CKD (chronic kidney disease), stage I  I have reviewed the medical record, interviewed the patient and family, and examined the patient. The following aspects are pertinent.  Past Medical History  Diagnosis  Date  . Bacterial endocarditis     a. s/p MVR and TVR at [facility] 06/2010 secondary to endocarditis related to IVDA; normal cors by cath 06/2010 at [facility]. b. re-do mitral valve replacement, tricuspid valve replacement, and aortic valve replacement (all bioprosthetic).  [name] DJD (degenerative joint disease)   . Hepatitis C   . Anemia   . Hypothyroidism   . Stroke [facility]) 05/2010    septic emboli to brain  . Anxiety   . Mental disorder   . Chronic pain   . Noncompliance   . Polysubstance abuse   . Sepsis (HCC)     a. 05/2010 with sepsis, vegegations on valve replacements, ARDS, VDRF, multiorgan failure with elevated cardiac enzymes, suffered a left fronto-parietal subarachnoid hemorrhage, pulmonary infarcts, ARF requiring brief hemodialysis. s/p perciardial MVR/TVR.  [name] Atrial flutter (HCC)     a. Post-op from valve replacement 06/2010.  [name] CHB (complete heart block) (HCC)     a. After valve replacement 09/2012 - pacer placement.  . Opiate abuse, episodic   . S/P AVR, redo MVR and redo TVR with bioprosthetic valves 09/26/2012    All bioprosthetic tissue valves placed for Strep viridans prosthetic valve endocarditis by Dr. Nathaniel Man @ [facility]  . S/P placement of cardiac pacemaker 10/08/2012    Transvenous permanent pacemaker for CHB following redo TVR, redo MVR, AVR @ [facility]  . 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 Shortness of breath   . Coronary artery disease   . CHF (congestive heart failure) (HCC)    Social History   Social History  . Marital Status: Divorced    Spouse Name: N/A  . Number of Children: N/A  . Years of Education: N/A   Social History Main Topics  . Smoking status: Current Some Day Smoker -- 1.00 packs/day for 30 years    Types: Cigarettes    Last Attempt to Quit: 02/13/2013    . Smokeless tobacco: Never Used     Comment: 1 pack every 3 days - smoking vapor cigs   . Alcohol Use: No     Comment: hx of  heroin, pain meds  . Drug Use: 7.00 per week    Special: Marijuana, Cocaine, Other-see comments     Comment: prior IVDA, cocaine. + occasional narcotics  . Sexual Activity: No   Other Topics Concern  . None   Social History Narrative   Family History  Problem Relation Age of Onset  . Cancer    . Coronary artery disease    . Cancer Maternal Uncle   . Cancer  Paternal Aunt   . Heart disease Maternal Grandfather   . Cancer Paternal Grandmother   . Cancer Paternal Grandfather    Scheduled Meds: . amoxicillin  500 mg Oral TID  . aspirin  81 mg Oral Daily  . enoxaparin (LOVENOX) injection  40 mg Subcutaneous Q24H  . furosemide  40 mg Intravenous BID  . gabapentin  100 mg Oral TID  . levothyroxine  200 mcg Oral QAC breakfast  . methadone  20 mg Oral TID  . potassium chloride  10 mEq Oral Daily  . senna  2 tablet Oral Daily  . sertraline  50 mg Oral Daily  . sodium chloride flush  3 mL Intravenous Q12H   Continuous Infusions:  PRN Meds:.sodium chloride, acetaminophen, LORazepam, morphine injection, ondansetron (ZOFRAN) IV, oxyCODONE, prochlorperazine, sodium chloride flush, temazepam Medications Prior to Admission:  Prior to Admission medications   Medication Sig Start Date End Date Taking? Authorizing Provider  amoxicillin (AMOXIL) 500 MG capsule Take 1 capsule (500 mg total) by mouth 3 (three) times daily. 09/02/13  Yes Doris Cheadle, MD  aspirin 81 MG chewable tablet Chew 81 mg by mouth daily.   Yes Historical Provider, MD  furosemide (LASIX) 40 MG tablet Take 1 tablet (40 mg total) by mouth 2 (two) times daily. Pt need OV prior future refills 04/12/15  Yes Josalyn Funches, MD  gabapentin (NEURONTIN) 100 MG capsule Take 1 capsule (100 mg total) by mouth 3 (three) times daily. 03/30/15  Yes Josalyn Funches, MD  levothyroxine (SYNTHROID, LEVOTHROID) 125  MCG tablet Take 1 tablet (125 mcg total) by mouth daily. Pt need OV for future refills Patient taking differently: Take 200 mcg by mouth daily. Pt need OV for future refills 05/17/15  Yes Josalyn Funches, MD  LORazepam (ATIVAN) 0.5 MG tablet Take 1 tablet (0.5 mg total) by mouth every 6 (six) hours as needed for anxiety or sleep. 07/26/13  Yes Joseph Art, DO  methadone (DOLOPHINE) 5 MG tablet Take 20 mg by mouth 3 (three) times daily.    Yes Historical Provider, MD  potassium chloride (K-DUR,KLOR-CON) 10 MEQ tablet Take 1 tablet (10 mEq total) by mouth daily. 09/20/13  Yes Doris Cheadle, MD  prochlorperazine (COMPAZINE) 10 MG tablet Take 10 mg by mouth every 6 (six) hours as needed for nausea or vomiting.   Yes Historical Provider, MD  senna (SENOKOT) 8.6 MG tablet Take 2-4 tablets by mouth daily.    Yes Historical Provider, MD  sertraline (ZOLOFT) 50 MG tablet Take 50 mg by mouth daily.   Yes Historical Provider, MD  temazepam (RESTORIL) 15 MG capsule Take 1 capsule (15 mg total) by mouth at bedtime as needed for sleep. 02/22/15  Yes Dessa Phi, MD   No Known Allergies Review of Systems  Constitutional: Positive for fatigue.  Respiratory: Positive for shortness of breath.   Musculoskeletal: Positive for back pain.    Physical Exam  Constitutional: She appears lethargic. She appears cachectic. She appears ill.  Cardiovascular: Normal rate and regular rhythm.   Pulmonary/Chest: Tachypnea noted. She has decreased breath sounds.  Neurological: She appears lethargic.  Skin: Skin is warm and dry.    Vital Signs: BP 84/53 mmHg  Pulse 74  Temp(Src) 97.6 F (36.4 C) (Oral)  Resp 19  SpO2 99%  LMP  (LMP Unknown) Pain Assessment: No/denies pain   Pain Score: 0-No pain   SpO2: SpO2: 99 % O2 Device:SpO2: 99 % O2 Flow Rate: .   IO: Intake/output summary:  Intake/Output Summary (Last 24  hours) at 06/24/15 1508 Last data filed at 06/24/15 0949  Gross per 24 hour  Intake      0 ml   Output   2550 ml  Net  -2550 ml    LBM:   Baseline Weight:   Most recent weight:        Palliative Assessment/Data:  Currently 30%     Discussed with Toya Smothers NP  Time In: 1445 Time Out: 1600 Time Total: 75 min Greater than 50%  of this time was spent counseling and coordinating care related to the above assessment and plan.  Signed by: Lorinda Creed, NP   Please contact Palliative Medicine Team phone at [phone] for questions and concerns.  For individual provider: See Loretha Stapler

## 2015-06-24 NOTE — ED Provider Notes (Signed)
CSN: [id number]     Arrival date & time 06/24/15  0252 History  By signing my name below, I, Alyssa Grove, attest that this documentation has been prepared under the direction and in the presence of Laurence Spates, MD. Electronically Signed: Alyssa Grove, ED Scribe. 06/24/2015. 3:36 AM.   Chief Complaint  Patient presents with  . Shortness of Breath   The history is provided by the patient. No language interpreter was used.    LEVEL 5 CAVEAT - DUE TO CONDITION  HPI Comments: [name] brought in by EMS from hospice is a 45 y.o. female with PMHx Hepatitis C, Sepsis, polysubstance abuse, bacterial endocarditis who presents to the Emergency Department complaining of sudden onset, constant shortness of breath onset 2 hours ago. En route to department, pt was placed on a CPAP machine which improved oxygen saturation to 97% from 90%. Pt is DNR, however she is requesting interventions to improve her breathing.  Past Medical History  Diagnosis Date  . Bacterial endocarditis     a. s/p MVR and TVR at [facility] 06/2010 secondary to endocarditis related to IVDA; normal cors by cath 06/2010 at [facility]. b. re-do mitral valve replacement, tricuspid valve replacement, and aortic valve replacement (all bioprosthetic).  [name] DJD (degenerative joint disease)   . Hepatitis C   . Anemia   . Hypothyroidism   . Stroke [facility]) 05/2010    septic emboli to brain  . Anxiety   . Mental disorder   . Chronic pain   . Noncompliance   . Polysubstance abuse   . Sepsis (HCC)     a. 05/2010 with sepsis, vegegations on valve replacements, ARDS, VDRF, multiorgan failure with elevated cardiac enzymes, suffered a left fronto-parietal subarachnoid hemorrhage, pulmonary infarcts, ARF requiring brief hemodialysis. s/p perciardial MVR/TVR.  [name] Atrial flutter (HCC)     a. Post-op from valve replacement 06/2010.  [name] CHB (complete heart block) (HCC)     a. After valve replacement 09/2012 - pacer placement.  . Opiate abuse, episodic   .  S/P AVR, redo MVR and redo TVR with bioprosthetic valves 09/26/2012    All bioprosthetic tissue valves placed for Strep viridans prosthetic valve endocarditis by Dr. Nathaniel Man @ [facility]  . S/P placement of cardiac pacemaker 10/08/2012    Transvenous permanent pacemaker for CHB following redo TVR, redo MVR, AVR @ [facility]  . 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 Shortness of breath    Past Surgical History  Procedure Laterality Date  . Cholecystectomy    . Tonsillectomy and adenoidectomy    . Valve replacement  09/26/2012    AVR + redo MVR + redo TVR for PVE @ [facility]  . Valve replacement  06/2010    MVR + TVR for bacterial endocarditis @ [facility]  . Pacemaker placement  10/08/2012    [facility] for CHB after redo TVR  . Cataract extraction    . Multiple extractions with alveoloplasty  09/01/2011    Procedure: MULTIPLE EXTRACION WITH ALVEOLOPLASTY;  Surgeon: Georgia Lopes, DDS;  Location: [facility] OR;  Service: Oral Surgery;  Laterality: N/A;  . Tee without cardioversion N/A 02/09/2013    Procedure: TRANSESOPHAGEAL ECHOCARDIOGRAM (TEE);  Surgeon: Laurey Morale, MD;  Location: [facility] ENDOSCOPY;  Service: Cardiovascular;  Laterality: N/A;  pt awaker, responsive,..tol procedure  well.Marland KitchenMarland KitchenMAP 74  . Tee without cardioversion N/A 04/27/2013    Procedure: TRANSESOPHAGEAL ECHOCARDIOGRAM (TEE);  Surgeon: Chrystie Nose, MD;  Location: [facility] ENDOSCOPY;  Service: Cardiovascular;  Laterality: N/A;  . Left and right heart catheterization with coronary angiogram N/A 07/21/2013    Procedure: LEFT AND RIGHT HEART CATHETERIZATION WITH CORONARY ANGIOGRAM;  Surgeon: Kathleene Hazel, MD;  Location: [facility] CATH LAB;  Service: Cardiovascular;  Laterality: N/A;   Family History  Problem Relation Age of Onset  . Cancer    . Coronary artery  disease    . Cancer Maternal Uncle   . Cancer Paternal Aunt   . Heart disease Maternal Grandfather   . Cancer Paternal Grandmother   . Cancer Paternal Grandfather    Social History  Substance Use Topics  . Smoking status: Current Some Day Smoker -- 0.00 packs/day for 30 years    Types: Cigarettes    Last Attempt to Quit: 02/13/2013  . Smokeless tobacco: Never Used     Comment: 1 pack every 3 days - smoking vapor cigs   . Alcohol Use: No   OB History    No data available     Review of Systems  Unable to perform ROS: Acuity of condition   Allergies  Review of patient's allergies indicates no known allergies.  Home Medications   Prior to Admission medications   Medication Sig Start Date End Date Taking? Authorizing Provider  amoxicillin (AMOXIL) 500 MG capsule Take 1 capsule (500 mg total) by mouth 3 (three) times daily. 09/02/13   Doris Cheadle, MD  aspirin 81 MG chewable tablet Chew 81 mg by mouth daily.    Historical Provider, MD  furosemide (LASIX) 40 MG tablet Take 1 tablet (40 mg total) by mouth 2 (two) times daily. Pt need OV prior future refills 04/12/15   Dessa Phi, MD  gabapentin (NEURONTIN) 100 MG capsule Take 1 capsule (100 mg total) by mouth 3 (three) times daily. 03/30/15   Josalyn Funches, MD  levothyroxine (SYNTHROID, LEVOTHROID) 125 MCG tablet Take 1 tablet (125 mcg total) by mouth daily. Pt need OV for future refills 05/17/15   Dessa Phi, MD  LORazepam (ATIVAN) 0.5 MG tablet Take 1 tablet (0.5 mg total) by mouth every 6 (six) hours as needed for anxiety or sleep. 07/26/13   Joseph Art, DO  methadone (DOLOPHINE) 5 MG tablet Take 5 mg by mouth every 8 (eight) hours.    Historical Provider, MD  oxyCODONE-acetaminophen (PERCOCET/ROXICET) 5-325 MG per tablet Take 1-2 tablets by mouth every 4 (four) hours as needed (1 tab for moderate pain; 2 tabs for severe pain). 07/26/13   Joseph Art, DO  potassium chloride (K-DUR,KLOR-CON) 10 MEQ tablet Take 1 tablet  (10 mEq total) by mouth daily. 09/20/13   Doris Cheadle, MD  senna (SENOKOT) 8.6 MG tablet Take 1 tablet by mouth daily.    Historical Provider, MD  sertraline (ZOLOFT) 50 MG tablet Take 50 mg by mouth daily.    Historical Provider, MD  temazepam (RESTORIL) 15 MG capsule Take 1 capsule (15 mg total) by mouth at bedtime as needed for sleep. 02/22/15   Josalyn Funches, MD   BP 122/79 mmHg  Pulse 82  Temp(Src) 97.7 F (36.5 C) (Axillary)  Resp 17  SpO2 100%  LMP  (LMP Unknown) Physical Exam  Constitutional: She is oriented to person, place, and time.  Pale, thin, chronically ill appearance Awake and alert  HENT:  Head: Normocephalic and atraumatic.  Moist mucous membranes  Eyes: Conjunctivae are normal. Pupils are equal, round, and reactive to light.  Neck: Neck supple.  Cardiovascular: Normal rate and regular rhythm.   Murmur heard. 2/6 systolic murmur  Pulmonary/Chest:  Increased work breathing with accessory muscle use Crackles b/l  Abdominal: Soft. Bowel sounds are normal. She exhibits no distension. There is no tenderness.  Musculoskeletal: She exhibits edema.  1+ Pedal edema BLE to knees  Neurological: She is alert and oriented to person, place, and time.  Skin: Skin is warm and dry. There is pallor.  Chronic venous stasis skin changes on BLE  Psychiatric: She has a normal mood and affect. Judgment normal.  Nursing note and vitals reviewed.   ED Course  .Critical Care Performed by: Laurence Spates Authorized by: Laurence Spates Total critical care time: 35 minutes Critical care time was exclusive of separately billable procedures and treating other patients. Critical care was necessary to treat or prevent imminent or life-threatening deterioration of the following conditions: respiratory failure. Critical care was time spent personally by me on the following activities: development of treatment plan with patient or surrogate, evaluation of patient's response to  treatment, examination of patient, obtaining history from patient or surrogate, ordering and performing treatments and interventions, ordering and review of laboratory studies, ordering and review of radiographic studies, pulse oximetry, re-evaluation of patient's condition and review of old charts.   (including critical care time) DIAGNOSTIC STUDIES: Oxygen Saturation is 98% on BiPAP, normal by my interpretation.    COORDINATION OF CARE: 3:04 AM Discussed treatment plan with pt at bedside which includes DG [facility] and lab work and pt agreed to plan.  Labs Review Labs Reviewed  COMPREHENSIVE METABOLIC PANEL - Abnormal; Notable for the following:    Sodium 134 (*)    Potassium 3.1 (*)    Glucose, Bld 119 (*)    Creatinine, Ser 1.13 (*)    GFR calc non Af Amer 57 (*)    All other components within normal limits  BRAIN NATRIURETIC PEPTIDE - Abnormal; Notable for the following:    B Natriuretic Peptide 388.2 (*)    All other components within normal limits  CBC WITH DIFFERENTIAL/PLATELET - Abnormal; Notable for the following:    Hemoglobin 11.0 (*)    HCT 35.1 (*)    RDW 19.2 (*)    Platelets 131 (*)    Lymphs Abs 0.5 (*)    All other components within normal limits  I-STAT VENOUS BLOOD GAS, ED - Abnormal; Notable for the following:    pH, Ven 7.329 (*)    pO2, Ven 96.0 (*)    All other components within normal limits  I-STAT TROPOININ, ED  I-STAT CG4 LACTIC ACID, ED    Imaging Review Dg Chest Port 1 View  06/24/2015  CLINICAL DATA:  Increasing shortness of breath. History of back. Endocarditis, opiate abuse. EXAM: PORTABLE CHEST 1 VIEW COMPARISON:  CT chest July 22, 2013. FINDINGS: Cardiac silhouette is mildly enlarged, status post median sternotomy for aortic valve replacement. Mediastinal silhouette is nonsuspicious. Pulmonary vascular congestion and mild interstitial prominence. At least moderate RIGHT and small LEFT pleural effusions with patchy alveolar airspace opacities  in the mid and lower lung zones. No pneumothorax. Soft tissue planes and included osseous structures are nonsuspicious. IMPRESSION: Mild cardiomegaly. Findings of pulmonary edema with confluent edema in the mid and lower lung zones, less likely pneumonia. At least moderate RIGHT and small LEFT pleural effusions. Electronically Signed   By: Awilda Metro M.D.   On:  06/24/2015 03:41   I have personally reviewed and evaluated these lab results as part of my medical decision-making.   EKG Interpretation None      MDM   Medications  ipratropium-albuterol (DUONEB) 0.5-2.5 (3) MG/3ML nebulizer solution 3 mL (3 mLs Nebulization Given 06/24/15 0318)  potassium chloride SA (K-DUR,KLOR-CON) CR tablet 60 mEq (not administered)  furosemide (LASIX) injection 40 mg (40 mg Intravenous Given 06/24/15 0431)    Final diagnoses:  Acute on chronic congestive heart failure, unspecified congestive heart failure type (HCC)  Acute on chronic respiratory failure with hypoxia (HCC)   Patient with history including CHF, currently in hospice, DO NOT RESUSCITATE, presents with worsening shortness of breath tonight. She was brought in by EMS and was on BiPAP on arrival, tachypneic But mentating appropriately. 88% on room air. The patient was placed back on BiPAP and obtained above lab work. Chest x-ray shows pulmonary edema with bilateral pleural effusions. EKG shows paced rhythm. Labs show normal troponin, CMP and CBC consistent with previous, BNP 388. Based on chest x-ray and exam notable for crackles, I suspect CHF exacerbation. Gave the patient IV Lasix. The patient has requested admission for improvement of her shortness of breath. Discussed admission with Dr. Katrinka Blazing and patient admitted to stepdown for further care.  I personally performed the services described in this documentation, which was scribed in my presence. The recorded information has been reviewed and is accurate.   Laurence Spates, MD 06/24/15  [phone]

## 2015-06-24 NOTE — Progress Notes (Addendum)
Report received from Dr. Clarene Duke Ms. Bendure is a 45 y.o. female on hospice with PMHx Hepatitis C, Sepsis, polysubstance abuse, bacterial endocarditis, CHF; who presents requesting treatment for complaints of shortness of breath. Upon admission into the emergency department  Lab work revealed BNP 388, potassium 3.1. Chest x-ray revealed pulmonary vascular congestion. Patient was given 40 mg of Lasix in the ED. Transferring to stepdown as patient currently requiring BiPAP. Patient is a DO NOT RESUSCITATE.

## 2015-06-24 NOTE — ED Notes (Signed)
Patient arrives via EMS with complaint of increased shortness of breath. Patient is hospice patient with DNR. Upon arrival patient was receiving oxygen via CPAP. CPAP was removed and patient expressed that she doesn't feel well and just wants to be more comfortable which is why she requested transport for treatment. Per EMS patient's caregivers reported that patient appears to be at baseline but with some increased work of breathing.

## 2015-06-25 DIAGNOSIS — N181 Chronic kidney disease, stage 1: Secondary | ICD-10-CM

## 2015-06-25 DIAGNOSIS — I5023 Acute on chronic systolic (congestive) heart failure: Principal | ICD-10-CM

## 2015-06-25 DIAGNOSIS — D649 Anemia, unspecified: Secondary | ICD-10-CM

## 2015-06-25 DIAGNOSIS — J9620 Acute and chronic respiratory failure, unspecified whether with hypoxia or hypercapnia: Secondary | ICD-10-CM

## 2015-06-25 LAB — CBC
HCT: 34.2 % — ABNORMAL LOW (ref 36.0–46.0)
HEMOGLOBIN: 10.7 g/dL — AB (ref 12.0–15.0)
MCH: 26 pg (ref 26.0–34.0)
MCHC: 31.3 g/dL (ref 30.0–36.0)
MCV: 83 fL (ref 78.0–100.0)
Platelets: 150 10*3/uL (ref 150–400)
RBC: 4.12 MIL/uL (ref 3.87–5.11)
RDW: 19.5 % — ABNORMAL HIGH (ref 11.5–15.5)
WBC: 5 10*3/uL (ref 4.0–10.5)

## 2015-06-25 LAB — BASIC METABOLIC PANEL
ANION GAP: 9 (ref 5–15)
BUN: 11 mg/dL (ref 6–20)
CALCIUM: 8.5 mg/dL — AB (ref 8.9–10.3)
CHLORIDE: 99 mmol/L — AB (ref 101–111)
CO2: 28 mmol/L (ref 22–32)
Creatinine, Ser: 1.08 mg/dL — ABNORMAL HIGH (ref 0.44–1.00)
GFR calc Af Amer: >60 mL/min (ref >60)
GFR calc non Af Amer: >60 mL/min (ref >60)
GLUCOSE: 113 mg/dL — AB (ref 65–99)
Potassium: 3.9 mmol/L (ref 3.5–5.1)
Sodium: 136 mmol/L (ref 135–145)

## 2015-06-25 MED ORDER — MORPHINE SULFATE 10 MG/5ML PO SOLN: 5.0000 mg | ORAL | Status: AC | PRN

## 2015-06-25 MED ORDER — FUROSEMIDE 40 MG PO TABS: 60.0000 mg | ORAL_TABLET | Freq: Two times a day (BID) | ORAL | Status: AC

## 2015-06-25 MED ORDER — FUROSEMIDE 40 MG PO TABS: 40.0000 mg | ORAL_TABLET | Freq: Two times a day (BID) | ORAL | Status: DC

## 2015-06-25 MED ORDER — POLYETHYLENE GLYCOL 3350 17 G PO PACK: 17.0000 g | PACK | Freq: Every day | ORAL | Status: AC | PRN

## 2015-06-25 MED ORDER — LEVOTHYROXINE SODIUM 200 MCG PO TABS: 200.0000 ug | ORAL_TABLET | Freq: Every day | ORAL | Status: AC

## 2015-06-25 MED ORDER — LORAZEPAM 0.5 MG PO TABS: 0.5000 mg | ORAL_TABLET | Freq: Four times a day (QID) | ORAL | Status: AC | PRN

## 2015-06-25 NOTE — Care Management Note (Signed)
Case Management Note  Patient Details  Name: [name] MRN: [id number] Date of Birth: 1970/02/07  Subjective/Objective:    Pt in for SOB and Acute on Chronic CHF. Pt is from home with Ex-boyfriend. Pt has Hospice Services via [facility]. Pt has all DME in the home. Plan for d/c home today and Per Liaison [facility] RN and SW to make a visit to pt today.              Action/Plan: CM did speak with pt and she is agreeable to Ambulance transport home. Daughter was going home to get 02 for transport, however she is pregnant and pt wanted ambulance transport. [facility] called for pickup-daughter has key to get into the home. Call was placed to daughter to make her aware that pt will be d/c today via ambulance transport. No further needs from CM at this time.    Expected Discharge Date:  06/27/15               Expected Discharge Plan:  Home w Hospice Care  In-House Referral:  NA  Discharge planning Services  CM Consult  Post Acute Care Choice:  Hospice Choice offered to:  Patient  DME Arranged:  N/A DME Agency:  NA  HH Arranged:  RN HH Agency:  [facility]  Status of Service:  Completed, signed off  Medicare Important Message Given:    Date Medicare IM Given:    Medicare IM give by:    Date Additional Medicare IM Given:    Additional Medicare Important Message give by:     If discussed at Long Length of Stay Meetings, dates discussed:    Additional Comments:  Gala Lewandowsky, RN 06/25/2015, 10:41 AM

## 2015-06-25 NOTE — Progress Notes (Signed)
Patient wants to rest at this time, discharge instructions given to patient and daughter,  Patient wants to "just rest" at this time awaiting transport.

## 2015-06-25 NOTE — Progress Notes (Signed)
Spoke with Hospice, aware patient is being discharged home and resuming care. Patient given prescriptions  Nurse made aware. Will continue to monitor patient

## 2015-06-25 NOTE — Discharge Summary (Signed)
Physician Discharge Summary   Patient ID: [name] MRN: [id number] DOB/AGE: 47/17/1972 47 y.o.  Admit date: 06/24/2015 Discharge date: 06/25/2015  Primary Care Physician:  Lora Paula, MD  Discharge Diagnoses:   . Acute on chronic respiratory failure (HCC) . Acute on chronic systolic heart failure (HCC) . Hypothyroidism . Pulmonary HTN/62 mmHg . 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 Hepatitis C . Anemia . Hypokalemia . CKD (chronic kidney disease), stage I  Consults: Palliative medicine  Recommendations for Outpatient Follow-up:  1. Resume hospice   DIET: Heart healthy diet    Allergies:  No Known Allergies   DISCHARGE MEDICATIONS: Current Discharge Medication List    START taking these medications   Details  morphine 10 MG/5ML solution Take 2.5 mLs (5 mg total) by mouth every 2 (two) hours as needed for severe pain (shortness of breath). Qty: 30 mL, Refills: 0    polyethylene glycol (MIRALAX) packet Take 17 g by mouth daily as needed for moderate constipation. Qty: 30 each, Refills: 1      CONTINUE these medications which have CHANGED   Details  furosemide (LASIX) 40 MG tablet Take 1.5 tablets (60 mg total) by mouth 2 (two) times daily. Pt need OV prior future refills Qty: 90 tablet, Refills: 0   Associated Diagnoses: Chronic systolic heart failure (HCC)    levothyroxine (SYNTHROID, LEVOTHROID) 200 MCG tablet Take 1 tablet (200 mcg total) by mouth daily. Pt need OV for future refills Qty: 30 tablet, Refills: 1    LORazepam (ATIVAN) 0.5 MG tablet Take 1 tablet (0.5 mg total) by mouth every 6 (six) hours as needed for anxiety or sleep. Qty: 30 tablet, Refills: 0      CONTINUE these medications which have NOT CHANGED   Details  amoxicillin (AMOXIL) 500 MG capsule Take 1 capsule (500 mg total) by mouth 3 (three) times daily. Qty: 90 capsule, Refills: 3    aspirin 81 MG chewable tablet Chew 81 mg by mouth daily.    gabapentin  (NEURONTIN) 100 MG capsule Take 1 capsule (100 mg total) by mouth 3 (three) times daily. Qty: 90 capsule, Refills: 1   Associated Diagnoses: Nonintractable headache, unspecified chronicity pattern, unspecified headache type    methadone (DOLOPHINE) 5 MG tablet Take 20 mg by mouth 3 (three) times daily.     potassium chloride (K-DUR,KLOR-CON) 10 MEQ tablet Take 1 tablet (10 mEq total) by mouth daily. Qty: 30 tablet, Refills: 3    prochlorperazine (COMPAZINE) 10 MG tablet Take 10 mg by mouth every 6 (six) hours as needed for nausea or vomiting.    senna (SENOKOT) 8.6 MG tablet Take 2-4 tablets by mouth daily.     sertraline (ZOLOFT) 50 MG tablet Take 50 mg by mouth daily.    temazepam (RESTORIL) 15 MG capsule Take 1 capsule (15 mg total) by mouth at bedtime as needed for sleep. Qty: 30 capsule, Refills: 2   Associated Diagnoses: Insomnia         Brief H and P: For complete details please refer to admission H and P, but in brief [name] is an unfortunate 45 y.o. female with medical history significant for chronic systolic heart failure, bacterial endocarditis, hepatitis C, polysubstance abuse presented to the emergency department via EMS with the chief complaint of worsening shortness of breath. She was in her usual state of health until the day of admission when she developed sudden onset persistent shortness of breath. Patient has been under hospice care for  the last 2 years. Otherwise she denied any chest pain palpitation headache dizziness syncope or near-syncope. She denied worsening lower extremity edema but did have orthopnea. She denied fever chills abdominal pain nausea vomiting dysuria hematuria frequency or urgency. She reported increasing her home oxygen with little relief. EMS was called by family and reportedly found patient to have oxygen saturation level 90. She was placed patient onCPAP which improved oxygen saturation 97%. Oxygen saturation level noted to be 88% on room  air.  Hospital Course:   Acute on chronic respiratory failure likely related to acute on chronic systolic heart failure, severe pulmonary hypertension. Currently in hospice care for over 2 years. - Oxygen saturation level 80% on room air. Chest x-ray showed pulmonary edema with confluent edema in mid and lower lungs and less likely pneumonia. Moderate right and small left pleural effusions.  - Patient was initially placed on BiPAP. Oxygen saturation level greater than 90% on BiPAP. Palliative care was consulted for goals of care. Patient was weaned off of BiPAP, currently O2 sats 97% on 2 L.  - Troponins negative, patient was placed on IV Lasix for diuresis. Patient was seen by hospice and palliative medicine and per patient's request, return back home with hospice with comfort care goals. She will be continued on Lasix, roxanol was added by palliative medicine for shortness of breath. I have increased her Lasix over to 60 mg twice a day   Acute on chronic systolic heart failure. BNP 388 Most recent echo 2015 shows an EF of 30%. Home Lasix dose 40 mg twice a day, not on beta blocker or ACE inhibitor -Patient was placed on IV diuresis, currently feeling closer to baseline. Oral Lasix was increased to 60 mg twice a day. Patient will resume hospice care at home.  Hypokalemia. Likely related to Lasix use. Mild. -Replaced   Chronic kidney disease stage I. Creatinine 1.13. This appears to be close to her baseline. Likely related to Lasix use   Hypothyroidism. Last noted TSH September 2016 15.7 -TSH >90, Synthroid is increased to 200 g daily, she needs to follow up with her PCP   Prosthetic heart valve/status post aVR redo and MVR redo -Continue amoxicillin  Day of Discharge BP 100/73 mmHg  Pulse 75  Temp(Src) 98 F (36.7 C) (Axillary)  Resp 14  Wt 60.6 kg (133 lb 9.6 oz)  SpO2 97%  LMP  (LMP Unknown)  Physical Exam: General: Alert and awake oriented x3 not in any acute distress,  cachectic and frail. HEENT: anicteric sclera, pupils reactive to light and accommodation CVS: S1-S2 clear no murmur rubs or gallops Chest: Decreased breath sounds at the bases with scattered rhonchi Abdomen: soft nontender, nondistended, normal bowel sounds Extremities: no cyanosis, clubbing or edema noted bilaterally    The results of significant diagnostics from this hospitalization (including imaging, microbiology, ancillary and laboratory) are listed below for reference.    LAB RESULTS: Basic Metabolic Panel:  Recent Labs Lab 06/24/15 0307 06/25/15 0443  NA 134* 136  K 3.1* 3.9  CL 102 99*  CO2 23 28  GLUCOSE 119* 113*  BUN 14 11  CREATININE 1.13* 1.08*  CALCIUM 9.0 8.5*   Liver Function Tests:  Recent Labs Lab 06/24/15 0307  AST 32  ALT 19  ALKPHOS 75  BILITOT 1.1  PROT 7.5  ALBUMIN 3.5   No results for input(s): LIPASE, AMYLASE in the last 168 hours. No results for input(s): AMMONIA in the last 168 hours. CBC:  Recent Labs Lab 06/24/15  1610 06/25/15 0443  WBC 4.6 5.0  NEUTROABS 3.7  --   HGB 11.0* 10.7*  HCT 35.1* 34.2*  MCV 83.6 83.0  PLT 131* 150   Cardiac Enzymes: No results for input(s): CKTOTAL, CKMB, CKMBINDEX, TROPONINI in the last 168 hours. BNP: Invalid input(s): POCBNP CBG: No results for input(s): GLUCAP in the last 168 hours.  Significant Diagnostic Studies:  Dg Chest Port 1 View  06/24/2015  CLINICAL DATA:  Increasing shortness of breath. History of back. Endocarditis, opiate abuse. EXAM: PORTABLE CHEST 1 VIEW COMPARISON:  CT chest July 22, 2013. FINDINGS: Cardiac silhouette is mildly enlarged, status post median sternotomy for aortic valve replacement. Mediastinal silhouette is nonsuspicious. Pulmonary vascular congestion and mild interstitial prominence. At least moderate RIGHT and small LEFT pleural effusions with patchy alveolar airspace opacities in the mid and lower lung zones. No pneumothorax. Soft tissue planes and included  osseous structures are nonsuspicious. IMPRESSION: Mild cardiomegaly. Findings of pulmonary edema with confluent edema in the mid and lower lung zones, less likely pneumonia. At least moderate RIGHT and small LEFT pleural effusions. Electronically Signed   By: Awilda Metro M.D.   On: 06/24/2015 03:41    2D ECHO:   Disposition and Follow-up: Discharge Instructions    Diet - low sodium heart healthy    Complete by:  As directed      Increase activity slowly    Complete by:  As directed             DISPOSITION: Home with home hospice   DISCHARGE FOLLOW-UP Follow-up Information    Follow up with Lora Paula, MD. Schedule an appointment as soon as possible for a visit in 2 weeks.   Specialty:  Family Medicine   Why:  for hospital follow-up, As needed   Contact information:   [address] [place] [address] [phone]        Time spent on Discharge: 25 mins   Signed:   Rayn Shorb M.D. [facility] 06/25/2015, 10:02 AM Pager: [phone]

## 2015-06-25 NOTE — Progress Notes (Signed)
[facility]- 3W-25 [facility] RN Visit  This is a GIP, related and covered admission of 06/24/15 per Dr. Barbee Shropshire with a [facility] dx of Heart Disease. Code status is DNR.   Patient transferred to [facility] ED during the night via EMS due to sudden onset of increased dyspnea. Patient did not notify Hospice. She is admitted due to acute on chronic respiratory failure. Chest x-ray in the ED revealed pulmonary edema with bilateral pleural effusion. Patient placed on BIPAP during transfer which was continued due to low 02 sat in the ED. Patient had a foley catheter placed and was given 40 mg IV lasix.   Patient seen at bedside, with daughter, Eber Jones and grandson at bedside.  Patient appears comfortable and now on 2 L [place] with O2 sats at 100% and respirations WNL.  She stated she was able to maintain her sats without the use of the BiPap last night.  She currently rates her pain at a 7 out of 10 to her lower extremities.  She is receiving her scheduled 20 mg Methadone tabs po TID and has received 1 dose of 20 mg Oxy IR and 2 doses of 10 mg Oxy IR for breakthrough pain.  She is currently receiving 40 mg Lasix IV BID and has a foley catheter in place with moderate clear, clear drainage noted in bag.  Discussed with patient, if she would like to keep the foley catheter in place at discharge for comfort d/t level of deconditioning.  Patient stated she would prefer to have if removed as she feels it causes pressure, which is uncomfortable.  Also, discussed DME and if any additional DME would be needed in the home prior to hospital discharge.  Patient denies any needs and stated she has everything she needs at this time.  Spoke to patient about having hospice aides come into the home to assist with bathing after discharge.  Patient stated she would consider utilizing the additional support, until she can regain some strength.  Spoke to [facility] homecare RN, Cordelia Pen who plans on visiting patient  this afternoon in the home if she is discharged.  Would recommend [facility] for transport d/t weakness and level of deconditioning.   [facility] will continue to follow patient and anticipate any discharge needs. Please call with any hospice-related questions or concerns.   Hessie Knows, RN, BSN [facility]  [phone]

## 2015-07-02 ENCOUNTER — Telehealth: Payer: Self-pay | Admitting: Family Medicine

## 2015-07-02 NOTE — Telephone Encounter (Signed)
Nurse from [facility] called to let PCP know that the patient was in the hospital for An acute episode of CHF which came after non compliance of meds for a week, patient stopped taking meds as prescribed.  Patient was discharged home with an increased dose of lasix 60mg  twice a day instead of 40mg  twice a day. Also with an Rx of liquid morphine that the patient did not fill.   Nurse would like a call back from PCP

## 2015-07-04 NOTE — Telephone Encounter (Signed)
Toniann Fail from Hospice called to get orders to continue seeing the pt.  Please f/u

## 2015-07-05 NOTE — Telephone Encounter (Signed)
Called back to [place]. Left VO to continue home hospice care.  Requested call back to discuss patient compliance with meds.

## 2015-07-19 ENCOUNTER — Telehealth: Payer: Self-pay | Admitting: Family Medicine

## 2015-07-19 DIAGNOSIS — G4700 Insomnia, unspecified: Secondary | ICD-10-CM

## 2015-07-19 MED ORDER — TEMAZEPAM 15 MG PO CAPS: 15.0000 mg | ORAL_CAPSULE | Freq: Every evening | ORAL | Status: DC | PRN

## 2015-07-19 NOTE — Telephone Encounter (Signed)
Called in Rx refill for temazepam to [facility]

## 2015-08-16 ENCOUNTER — Telehealth: Payer: Self-pay | Admitting: Family Medicine

## 2015-08-16 DIAGNOSIS — R519 Headache, unspecified: Secondary | ICD-10-CM

## 2015-08-16 DIAGNOSIS — R51 Headache: Principal | ICD-10-CM

## 2015-08-16 NOTE — Telephone Encounter (Signed)
Toniann Fail from [facility] called requesting a refill on the following medications for the pt.   gabapentin (NEURONTIN) 100 MG capsule amoxicillin (AMOXIL) 500 MG capsule   Pt. Gets her medication sent to [facility]. Please f/u

## 2015-08-17 MED ORDER — AMOXICILLIN 500 MG PO CAPS: 500.0000 mg | ORAL_CAPSULE | Freq: Three times a day (TID) | ORAL | 0 refills | Status: DC

## 2015-08-17 MED ORDER — GABAPENTIN 100 MG PO CAPS: 100.0000 mg | ORAL_CAPSULE | Freq: Three times a day (TID) | ORAL | 0 refills | Status: DC

## 2015-08-17 NOTE — Telephone Encounter (Signed)
Requested medications refilled 

## 2015-08-18 ENCOUNTER — Other Ambulatory Visit: Payer: Self-pay | Admitting: Pharmacist

## 2015-08-18 MED ORDER — SERTRALINE HCL 50 MG PO TABS: 50.0000 mg | ORAL_TABLET | Freq: Every day | ORAL | 2 refills | Status: AC

## 2015-08-27 ENCOUNTER — Telehealth: Payer: Self-pay | Admitting: Family Medicine

## 2015-08-27 NOTE — Telephone Encounter (Signed)
Toniann Fail from Hospice called to let pt. Nurse know that pt. Will start her next certification period on 09/14/15 and needs approval from pt. PCP. Please f/u

## 2015-08-29 NOTE — Telephone Encounter (Signed)
Will route to PCP 

## 2015-08-30 NOTE — Telephone Encounter (Signed)
Called back to [facility] VO to continue home hospice

## 2015-08-30 NOTE — Telephone Encounter (Signed)
Toniann Fail from Hospice called to get verbal order for continuation of care.

## 2015-10-30 ENCOUNTER — Telehealth: Payer: Self-pay | Admitting: Family Medicine

## 2015-10-30 NOTE — Telephone Encounter (Signed)
Ms. [name] needs verbal orders to proceed with the next hospice benefit period....  Please follow up

## 2015-10-31 NOTE — Telephone Encounter (Signed)
Contacted wendy to make aware she has the verbal order and lvm regarding information

## 2015-10-31 NOTE — Telephone Encounter (Signed)
Please call back and give verbal order for hospice per Dr. Dessa Phi

## 2015-12-27 ENCOUNTER — Other Ambulatory Visit: Payer: Self-pay | Admitting: *Deleted

## 2015-12-27 MED ORDER — SENNOSIDES 8.6 MG PO TABS: 2.0000 | ORAL_TABLET | Freq: Every day | ORAL | 1 refills | Status: AC

## 2015-12-27 NOTE — Telephone Encounter (Signed)
Electronic Med RF for [name] sent to pharmacy

## 2016-01-03 ENCOUNTER — Other Ambulatory Visit: Payer: Self-pay | Admitting: Family Medicine

## 2016-01-03 ENCOUNTER — Telehealth: Payer: Self-pay | Admitting: Family Medicine

## 2016-01-03 DIAGNOSIS — F5101 Primary insomnia: Secondary | ICD-10-CM

## 2016-01-03 MED ORDER — TEMAZEPAM 15 MG PO CAPS
15.0000 mg | ORAL_CAPSULE | Freq: Every evening | ORAL | 2 refills | Status: AC | PRN
Start: 2016-01-03 — End: ?

## 2016-01-03 NOTE — Telephone Encounter (Signed)
[name] from hospice was called and verbal orders were given for her to continue hospice service.

## 2016-01-03 NOTE — Telephone Encounter (Signed)
Please inform patient Restoril Rx ready for pick up

## 2016-01-03 NOTE — Telephone Encounter (Signed)
Fannie Knee from [facility] called requesting verbal orders to continue services for Hospice.  Please f/u

## 2016-01-04 NOTE — Telephone Encounter (Signed)
Pt was called and informed of script being ready for pick up, pt states that she get her medications delivered. Will fax over script to pharmacy.

## 2016-02-20 ENCOUNTER — Other Ambulatory Visit: Payer: Self-pay | Admitting: Family Medicine

## 2016-02-27 ENCOUNTER — Telehealth: Payer: Self-pay | Admitting: Family Medicine

## 2016-02-27 NOTE — Telephone Encounter (Signed)
Will route to PCP 

## 2016-02-27 NOTE — Telephone Encounter (Signed)
[name] from [facility] called to speak with PCP in regards to getting a verbal order to proceed for the next benefit period, in other words if PCP is ok with patient being under hospice care.  Thank you.

## 2016-02-29 NOTE — Telephone Encounter (Signed)
Called back to [place] VO, happy to extend benefit period for hospice as long as patient still qualifies based on her medical conditions

## 2016-04-23 ENCOUNTER — Telehealth: Payer: Self-pay | Admitting: Family Medicine

## 2016-04-23 NOTE — Telephone Encounter (Signed)
Toniann Fail from [facility] calling to get verbal orders for patient to go into her next hospice benefit period   CB#: [phone]

## 2016-04-29 NOTE — Telephone Encounter (Signed)
Toniann Fail from [facility] called to check the status of verbal order.   Please follow up.   Thank you.

## 2016-04-30 NOTE — Telephone Encounter (Signed)
Pt was called and a VM was left giving verbal orders for Hospice.

## 2016-05-28 ENCOUNTER — Encounter: Payer: Self-pay | Admitting: Family Medicine

## 2016-06-20 ENCOUNTER — Telehealth: Payer: Self-pay | Admitting: Family Medicine

## 2016-06-20 NOTE — Telephone Encounter (Signed)
Toniann Fail from [facility] called requesting orders for pt. Next hospice benefit period.  Please f/u

## 2016-06-23 NOTE — Telephone Encounter (Signed)
Will route to PCP 

## 2016-06-24 NOTE — Telephone Encounter (Signed)
Toniann Fail from [facility] called again  requesting orders for pt. Next hospice benefit period.  Please f/u

## 2016-06-24 NOTE — Telephone Encounter (Signed)
Called back to [name] order to continue hospice

## 2016-07-31 ENCOUNTER — Telehealth: Payer: Self-pay | Admitting: Family Medicine

## 2016-07-31 NOTE — Telephone Encounter (Signed)
Patient is going onto next hospice period, please authorize verbal order.  Please follow up

## 2016-07-31 NOTE — Telephone Encounter (Signed)
Called back to [employer] of verbal order to certify for next service period

## 2016-08-06 ENCOUNTER — Other Ambulatory Visit: Payer: Self-pay | Admitting: Family Medicine

## 2016-08-06 DIAGNOSIS — R51 Headache: Principal | ICD-10-CM

## 2016-08-06 DIAGNOSIS — R519 Headache, unspecified: Secondary | ICD-10-CM

## 2016-08-20 ENCOUNTER — Other Ambulatory Visit: Payer: Self-pay | Admitting: Family Medicine

## 2016-10-01 ENCOUNTER — Other Ambulatory Visit: Payer: Self-pay | Admitting: Family Medicine

## 2017-05-13 DEATH — deceased
